# Patient Record
Sex: Male | Born: 1940
Health system: Southern US, Community
[De-identification: ages and names within clinical notes are randomized; demographics above are authoritative.]

## PROBLEM LIST (undated history)

## (undated) DIAGNOSIS — E785 Hyperlipidemia, unspecified: Secondary | ICD-10-CM

## (undated) DIAGNOSIS — I499 Cardiac arrhythmia, unspecified: Secondary | ICD-10-CM

## (undated) DIAGNOSIS — I10 Essential (primary) hypertension: Secondary | ICD-10-CM

## (undated) DIAGNOSIS — M4800 Spinal stenosis, site unspecified: Secondary | ICD-10-CM

## (undated) DIAGNOSIS — E7800 Pure hypercholesterolemia, unspecified: Secondary | ICD-10-CM

## (undated) DIAGNOSIS — A419 Sepsis, unspecified organism: Secondary | ICD-10-CM

## (undated) DIAGNOSIS — C801 Malignant (primary) neoplasm, unspecified: Secondary | ICD-10-CM

## (undated) DIAGNOSIS — I7090 Unspecified atherosclerosis: Secondary | ICD-10-CM

## (undated) DIAGNOSIS — I8390 Asymptomatic varicose veins of unspecified lower extremity: Secondary | ICD-10-CM

## (undated) DIAGNOSIS — I509 Heart failure, unspecified: Secondary | ICD-10-CM

## (undated) DIAGNOSIS — I2510 Atherosclerotic heart disease of native coronary artery without angina pectoris: Secondary | ICD-10-CM

## (undated) DIAGNOSIS — I872 Venous insufficiency (chronic) (peripheral): Secondary | ICD-10-CM

## (undated) DIAGNOSIS — D649 Anemia, unspecified: Secondary | ICD-10-CM

## (undated) HISTORY — DX: Heart failure, unspecified: I50.9

## (undated) HISTORY — DX: Cardiac arrhythmia, unspecified: I49.9

## (undated) HISTORY — DX: Atherosclerotic heart disease of native coronary artery without angina pectoris: I25.10

## (undated) HISTORY — PX: TONSILLECTOMY: SUR1361

## (undated) HISTORY — PX: BACK SURGERY: SHX140

## (undated) HISTORY — DX: Sepsis, unspecified organism: A41.9

---

## 2006-08-17 ENCOUNTER — Ambulatory Visit: Payer: Self-pay | Admitting: Gastroenterology

## 2007-09-26 ENCOUNTER — Ambulatory Visit: Payer: Self-pay | Admitting: Urology

## 2008-05-06 ENCOUNTER — Ambulatory Visit: Payer: Self-pay | Admitting: Family Medicine

## 2009-04-30 ENCOUNTER — Ambulatory Visit: Payer: Self-pay | Admitting: Gastroenterology

## 2009-10-01 ENCOUNTER — Ambulatory Visit: Payer: Self-pay | Admitting: Gastroenterology

## 2009-11-26 ENCOUNTER — Ambulatory Visit: Payer: Self-pay | Admitting: Gastroenterology

## 2013-03-22 ENCOUNTER — Ambulatory Visit: Payer: Self-pay | Admitting: Orthopedic Surgery

## 2014-05-28 ENCOUNTER — Ambulatory Visit: Payer: Self-pay

## 2014-05-31 ENCOUNTER — Emergency Department: Payer: Self-pay | Admitting: Emergency Medicine

## 2014-05-31 LAB — URINALYSIS, COMPLETE
BACTERIA: NONE SEEN
Specific Gravity: 1.02 (ref 1.003–1.030)
Squamous Epithelial: NONE SEEN
WBC UR: 446 /HPF (ref 0–5)

## 2014-06-04 ENCOUNTER — Ambulatory Visit: Payer: Self-pay | Admitting: Urology

## 2014-06-10 ENCOUNTER — Ambulatory Visit: Payer: Self-pay | Admitting: Urology

## 2014-11-15 NOTE — Op Note (Signed)
PATIENT NAME:  Duane Owens, Duane Owens MR#:  [id number] DATE OF BIRTH:  [birth year]  DATE OF PROCEDURE:  06/10/2014  PREOPERATIVE DIAGNOSES:  1. Urethral stricture disease.  2.  Prostate cancer.  3. Urinary retention.   POSTOPERATIVE DIAGNOSES:  1. Urethral stricture disease.  2.  Prostate cancer.  3. Urinary retention.   PROCEDURE:  1. Cystoscopy with visual internal urethrotomy using the holmium laser.  2. Foley catheter placement.   SURGEON: Maryan Puls, M.D.   ANESTHESIOLOGISTS:   Dennard Nip, MD.  ANESTHETIC METHOD: General per Dr. Ronelle Nigh and local per Dr. Yves Dill.   INDICATIONS: See the dictated history and physical. After informed consent, the patient requests the above procedure.   OPERATIVE SUMMARY: After adequate general anesthesia had been obtained, the patient was placed into dorsolithotomy position and the perineum was prepped and draped in the usual fashion. The 21 French cystoscope was coupled with the camera and then advanced into the urethra. A stricture was encountered at the bulbar urethra. A 0.035 guidewire was then passed through the stricture and curled into the bladder. The 550 micron holmium laser fiber was introduced through the scope and the stricture incised at the 12 o'clock position, eventually allowing entry into the bladder.   The bladder was thoroughly inspected. No bladder mucosal lesions were identified. Both ureteral orifices were identified and had clear efflux. At this point, cystoscope was removed taking care of the guidewire in position. Viscous Xylocaine 10 mL was instilled within the urethra. A 20 French [place] catheter was then advanced over the guidewire and positioned in the bladder. Guidewire was removed. B and O suppository was placed. The procedure was then terminated and the patient was transferred to the recovery room in stable condition.     ____________________________ Otelia Limes. Yves Dill, MD mrw:kl D: 06/10/2014 13:51:44  ET T: 06/10/2014 15:39:56 ET JOB#: [id number]  cc: Otelia Limes. Yves Dill, MD, <Dictator> Royston Cowper MD ELECTRONICALLY SIGNED 06/10/2014 17:16

## 2014-11-15 NOTE — H&P (Signed)
PATIENT NAME:  Duane Owens, Duane Owens MR#:  [id number] DATE OF BIRTH:  30-Jun-1941  DATE OF ADMISSION:  06/10/2014  CHIEF COMPLAINT: Difficulty voiding.   HISTORY OF PRESENT ILLNESS: Duane Owens is a 74 year old African American male with a 2-3 month history of decreased force of his stream and incomplete emptying. He actually developed urinary retention and went to the Emergency Room on 05/31/2014. Catheter placement at that time was unsuccessful. However, the patient was able to void successfully after the Emergency Room visit. He was evaluated with cystoscopy in the office on 06/02/2014, which indicated a tight stricture in the bulbar urethra and a false passage in the bulbar urethra from unsuccessful catheter placement in the ER. He comes in now for cystoscopy with internal urethrotomy using the holmium laser.   ALLERGIES: No drug allergies.   CURRENT MEDICATIONS INCLUDE: Allopurinol, gemfibrozil, hydrocodone, hydrochlorothiazide,  and amlodipine.   PAST SURGICAL HISTORY:  Lumbar laminectomy in 1998 and 2001.  I-125 brachytherapy in 2011.   PAST AND CURRENT MEDICAL CONDITIONS:  1.  Stage T1c, Gleason grade 4 + 4 adenocarcinoma of the prostate, status post I-125 brachytherapy in 2011.  2.  Gout.  3.  Degenerative spine disease.  4.  Hypertension.  5.  Hyperlipidemia.   REVIEW OF SYSTEMS: The patient denied chest pain, shortness of breath, diabetes, stroke, or hematuria.   PHYSICAL EXAMINATION:  GENERAL: Well-nourished African American male in no acute distress.  HEENT: Sclerae were clear. Pupils were equally round, reactive to light and accommodation. Extraocular movements were intact.  NECK: Supple. No palpable cervical adenopathy. No audible carotid bruits.  LUNGS: Clear to auscultation.  CARDIOVASCULAR: Regular rhythm and rate, without audible murmurs.  ABDOMEN: Soft, nontender abdomen.  GENITOURINARY: Uncircumcised. Testes smooth, nontender; 18 mL size each.  RECTAL: A 10 g, flat,  smooth, nontender prostate.  NEUROMUSCULAR: Alert and oriented x 3.   IMPRESSION: Urethral stricture disease.   PLAN: Cystoscopy with internal urethrotomy using holmium laser.    ____________________________ Otelia Limes. Yves Dill, MD mrw:MT D: 06/04/2014 10:03:00 ET T: 06/04/2014 10:14:27 ET JOB#: [id number]  cc: Otelia Limes. Yves Dill, MD, <Dictator> Royston Cowper MD ELECTRONICALLY SIGNED 06/04/2014 16:45

## 2015-07-15 ENCOUNTER — Other Ambulatory Visit: Payer: Self-pay | Admitting: Vascular Surgery

## 2015-08-04 ENCOUNTER — Ambulatory Visit
Admission: RE | Admit: 2015-08-04 | Discharge: 2015-08-04 | Disposition: A | Discharge status: Home / Self Care | Payer: Medicare Other | Source: Ambulatory Visit | Attending: Vascular Surgery | Admitting: Vascular Surgery

## 2015-08-04 ENCOUNTER — Encounter: Payer: Self-pay | Admitting: *Deleted

## 2015-08-04 ENCOUNTER — Encounter
Admission: RE | Disposition: A | Discharge status: Home / Self Care | Payer: Self-pay | Source: Ambulatory Visit | Attending: Vascular Surgery

## 2015-08-04 DIAGNOSIS — I70211 Atherosclerosis of native arteries of extremities with intermittent claudication, right leg: Secondary | ICD-10-CM | POA: Insufficient documentation

## 2015-08-04 HISTORY — DX: Unspecified atherosclerosis: I70.90

## 2015-08-04 HISTORY — DX: Malignant (primary) neoplasm, unspecified: C80.1

## 2015-08-04 HISTORY — DX: Hyperlipidemia, unspecified: E78.5

## 2015-08-04 HISTORY — DX: Pure hypercholesterolemia, unspecified: E78.00

## 2015-08-04 HISTORY — DX: Asymptomatic varicose veins of unspecified lower extremity: I83.90

## 2015-08-04 HISTORY — DX: Venous insufficiency (chronic) (peripheral): I87.2

## 2015-08-04 HISTORY — DX: Essential (primary) hypertension: I10

## 2015-08-04 HISTORY — DX: Spinal stenosis, site unspecified: M48.00

## 2015-08-04 SURGERY — LOWER EXTREMITY ANGIOGRAPHY
Anesthesia: Moderate Sedation | Laterality: Right

## 2015-08-04 MED ORDER — HYDROMORPHONE HCL 1 MG/ML IJ SOLN: 1.0000 mg | Freq: Once | INTRAMUSCULAR | Status: DC

## 2015-08-04 MED ORDER — CEFUROXIME SODIUM 1.5 G IJ SOLR: 1.5000 g | INTRAMUSCULAR | Status: DC

## 2015-08-04 MED ORDER — ONDANSETRON HCL 4 MG/2ML IJ SOLN: 4.0000 mg | Freq: Four times a day (QID) | INTRAMUSCULAR | Status: DC | PRN

## 2015-08-04 MED ORDER — SODIUM CHLORIDE 0.9 % IV SOLN: INTRAVENOUS | Status: DC

## 2015-08-11 ENCOUNTER — Ambulatory Visit
Admission: RE | Admit: 2015-08-11 | Discharge: 2015-08-11 | Disposition: A | Discharge status: Home / Self Care | Payer: Medicare Other | Source: Ambulatory Visit | Attending: Vascular Surgery | Admitting: Vascular Surgery

## 2015-08-11 ENCOUNTER — Encounter: Payer: Self-pay | Admitting: Anesthesiology

## 2015-08-11 ENCOUNTER — Encounter
Admission: RE | Disposition: A | Discharge status: Home / Self Care | Payer: Self-pay | Source: Ambulatory Visit | Attending: Vascular Surgery

## 2015-08-11 DIAGNOSIS — Z8546 Personal history of malignant neoplasm of prostate: Secondary | ICD-10-CM | POA: Diagnosis not present

## 2015-08-11 DIAGNOSIS — E785 Hyperlipidemia, unspecified: Secondary | ICD-10-CM | POA: Diagnosis not present

## 2015-08-11 DIAGNOSIS — I872 Venous insufficiency (chronic) (peripheral): Secondary | ICD-10-CM | POA: Insufficient documentation

## 2015-08-11 DIAGNOSIS — F17200 Nicotine dependence, unspecified, uncomplicated: Secondary | ICD-10-CM | POA: Diagnosis not present

## 2015-08-11 DIAGNOSIS — E669 Obesity, unspecified: Secondary | ICD-10-CM | POA: Diagnosis not present

## 2015-08-11 DIAGNOSIS — Z79899 Other long term (current) drug therapy: Secondary | ICD-10-CM | POA: Diagnosis not present

## 2015-08-11 DIAGNOSIS — I70211 Atherosclerosis of native arteries of extremities with intermittent claudication, right leg: Secondary | ICD-10-CM | POA: Diagnosis present

## 2015-08-11 DIAGNOSIS — I8310 Varicose veins of unspecified lower extremity with inflammation: Secondary | ICD-10-CM | POA: Diagnosis not present

## 2015-08-11 DIAGNOSIS — M4806 Spinal stenosis, lumbar region: Secondary | ICD-10-CM | POA: Diagnosis not present

## 2015-08-11 DIAGNOSIS — I10 Essential (primary) hypertension: Secondary | ICD-10-CM | POA: Insufficient documentation

## 2015-08-11 HISTORY — PX: PERIPHERAL VASCULAR CATHETERIZATION: SHX172C

## 2015-08-11 LAB — CREATININE, SERUM: CREATININE: 1.1 mg/dL (ref 0.61–1.24)

## 2015-08-11 LAB — POTASSIUM: Potassium: 4.1 mmol/L (ref 3.5–5.1)

## 2015-08-11 LAB — BUN: BUN: 29 mg/dL — ABNORMAL HIGH (ref 6–20)

## 2015-08-11 SURGERY — ABDOMINAL AORTOGRAM W/LOWER EXTREMITY
Laterality: Right | Wound class: Clean

## 2015-08-11 MED ORDER — DEXTROSE 5 % IV SOLN
INTRAVENOUS | Status: AC
  Filled 2015-08-11 (×10): qty 1.5

## 2015-08-11 MED ORDER — CLOPIDOGREL BISULFATE 75 MG PO TABS
ORAL_TABLET | ORAL | Status: AC
  Filled 2015-08-11: qty 4

## 2015-08-11 MED ORDER — LIDOCAINE HCL (PF) 1 % IJ SOLN
INTRAMUSCULAR | Status: AC
  Filled 2015-08-11: qty 30

## 2015-08-11 MED ORDER — MIDAZOLAM HCL 5 MG/5ML IJ SOLN
INTRAMUSCULAR | Status: AC
  Filled 2015-08-11: qty 5

## 2015-08-11 MED ORDER — OXYCODONE HCL 5 MG PO TABS: 5.0000 mg | ORAL_TABLET | ORAL | Status: DC | PRN

## 2015-08-11 MED ORDER — HEPARIN SODIUM (PORCINE) 1000 UNIT/ML IJ SOLN
INTRAMUSCULAR | Status: DC | PRN
  Administered 2015-08-11: 5000 [IU] via INTRAVENOUS

## 2015-08-11 MED ORDER — FAMOTIDINE 20 MG PO TABS: 40.0000 mg | ORAL_TABLET | ORAL | Status: DC | PRN

## 2015-08-11 MED ORDER — IOHEXOL 300 MG/ML  SOLN
INTRAMUSCULAR | Status: DC | PRN
Start: 2015-08-11 — End: 2015-08-11
  Administered 2015-08-11: 85 mL via INTRA_ARTERIAL

## 2015-08-11 MED ORDER — ACETAMINOPHEN 325 MG RE SUPP
325.0000 mg | RECTAL | Status: DC | PRN
  Filled 2015-08-11: qty 2

## 2015-08-11 MED ORDER — ACETAMINOPHEN 325 MG PO TABS: 325.0000 mg | ORAL_TABLET | ORAL | Status: DC | PRN

## 2015-08-11 MED ORDER — CLOPIDOGREL BISULFATE 75 MG PO TABS: 75.0000 mg | ORAL_TABLET | Freq: Every day | ORAL | Status: DC

## 2015-08-11 MED ORDER — METHYLPREDNISOLONE SODIUM SUCC 125 MG IJ SOLR: 125.0000 mg | INTRAMUSCULAR | Status: DC | PRN

## 2015-08-11 MED ORDER — LIDOCAINE-EPINEPHRINE 1 %-1:100000 IJ SOLN
INTRAMUSCULAR | Status: DC | PRN
  Administered 2015-08-11: 10 mL

## 2015-08-11 MED ORDER — HEPARIN SODIUM (PORCINE) 1000 UNIT/ML IJ SOLN
INTRAMUSCULAR | Status: AC
  Filled 2015-08-11: qty 1

## 2015-08-11 MED ORDER — MIDAZOLAM HCL 2 MG/2ML IJ SOLN
INTRAMUSCULAR | Status: DC | PRN
  Administered 2015-08-11: 1 mg via INTRAVENOUS
  Administered 2015-08-11: 2 mg via INTRAVENOUS

## 2015-08-11 MED ORDER — FENTANYL CITRATE (PF) 100 MCG/2ML IJ SOLN
INTRAMUSCULAR | Status: DC | PRN
  Administered 2015-08-11 (×2): 50 ug via INTRAVENOUS

## 2015-08-11 MED ORDER — MORPHINE SULFATE (PF) 4 MG/ML IV SOLN: 2.0000 mg | INTRAVENOUS | Status: DC | PRN

## 2015-08-11 MED ORDER — FENTANYL CITRATE (PF) 100 MCG/2ML IJ SOLN
INTRAMUSCULAR | Status: AC
  Filled 2015-08-11: qty 2

## 2015-08-11 MED ORDER — SODIUM CHLORIDE 0.9 % IV SOLN
INTRAVENOUS | Status: DC
  Administered 2015-08-11 (×2): via INTRAVENOUS

## 2015-08-11 MED ORDER — CLOPIDOGREL BISULFATE 75 MG PO TABS
300.0000 mg | ORAL_TABLET | Freq: Once | ORAL | Status: AC
  Administered 2015-08-11: 300 mg via ORAL

## 2015-08-11 MED ORDER — HEPARIN (PORCINE) IN NACL 2-0.9 UNIT/ML-% IJ SOLN
INTRAMUSCULAR | Status: AC
  Filled 2015-08-11: qty 1000

## 2015-08-11 SURGICAL SUPPLY — 25 items
BALLN LUTONIX 6X120X130 (BALLOONS) ×5
BALLN ULTRVRSE 4X120X150 (BALLOONS) ×5
BALLN ULTRVRSE 6X100X130C (BALLOONS) ×5
BALLOON LUTONIX 6X120X130 (BALLOONS) ×3 IMPLANT
BALLOON ULTRVRSE 4X120X150 (BALLOONS) ×3 IMPLANT
BALLOON ULTRVRSE 6X100X130C (BALLOONS) ×3 IMPLANT
CATH ANGIO STR 110CM 5SH (CATHETERS) ×5 IMPLANT
CATH CROSSER 14S OTW 106CM (CATHETERS) ×5 IMPLANT
CATH PIG 70CM (CATHETERS) ×5 IMPLANT
CATH RIM 65CM (CATHETERS) ×5 IMPLANT
CATH SKICK ANG 70CM (CATHETERS) ×5 IMPLANT
DEVICE PRESTO INFLATION (MISCELLANEOUS) ×5 IMPLANT
DEVICE STARCLOSE SE CLOSURE (Vascular Products) ×5 IMPLANT
GLIDEWIRE ANGLED SS 035X260CM (WIRE) ×5 IMPLANT
KIT FLOWMATE PROCEDURAL (MISCELLANEOUS) ×5 IMPLANT
LIFESTENT 7X100X130 (Permanent Stent) ×5 IMPLANT
PACK ANGIOGRAPHY (CUSTOM PROCEDURE TRAY) ×5 IMPLANT
SET INTRO CAPELLA COAXIAL (SET/KITS/TRAYS/PACK) ×5 IMPLANT
SHEATH BRITE TIP 5FRX11 (SHEATH) ×5 IMPLANT
SHEATH RAABE 6FR (SHEATH) ×5 IMPLANT
SYR MEDRAD MARK V 150ML (SYRINGE) ×5 IMPLANT
TUBING CONTRAST HIGH PRESS 72 (TUBING) ×5 IMPLANT
WIRE J 3MM .035X145CM (WIRE) ×5 IMPLANT
WIRE MAGIC TORQUE 260C (WIRE) ×5 IMPLANT
WIRE SPARTACORE .014X300CM (WIRE) ×5 IMPLANT

## 2015-08-11 NOTE — Progress Notes (Signed)
Pt clinically stable post angiogram with stenting, daughter present, Dr Delana Meyer in to see pt with questions answered, discharge teaching done with questions answered with return appt, made, no bleeding nor hematoma at right groin site,

## 2015-08-11 NOTE — Progress Notes (Signed)
No changes, no bleeding nor hematoma at groin site, discharge instruction s given, no changes,

## 2015-08-11 NOTE — H&P (Signed)
[facility] History & Physical Update  The patient was interviewed and re-examined.  The patient's previous History and Physical has been reviewed and is unchanged.  There is no change in the plan of care. We plan to proceed with the scheduled procedure.  Myiah Petkus, Dolores Lory, MD  08/11/2015, 9:28 AM

## 2015-08-11 NOTE — Op Note (Signed)
[facility] Percutaneous Study/Intervention Procedural Note   Date of Surgery: 08/11/2015  Surgeon:  Katha Cabal, MD.  Pre-operative Diagnosis: Atherosclerotic occlusive disease bilateral lower extremities with lifestyle limiting claudication right leg  Post-operative diagnosis: Same  Procedure(s) Performed: 1. Introduction catheter into right lower extremity 3rd order catheter placement  2. Contrast injection right lower extremity for distal runoff   3. Crosser atherectomy of the right SFA and popliteal arteries 4.  Percutaneous transluminal angioplasty and stent placement right superficial femoral artery and popliteal             5.   Star close closure left common femoral arteriotomy  Anesthesia: Conscious sedation was administered under my direct supervision. IV Versed plus fentanyl were utilized. Continuous ECG, pulse oximetry and blood pressure was monitored throughout the entire procedure. A total of 3 milligrams of Versed and 100 micrograms of fentanyl were utilized.  Conscious sedation was begun at  9:32 AM and concluded at 10:29 AM for a total of 57 minutes.  Sheath: 6 [place]  Contrast: 85 cc  Fluoroscopy Time: 7.3 minutes  Indications: Duane Owens presents with lifestyle limiting pain in the right lower extremity with known atherosclerotic occlusion of the right SFA and popliteal. The risks and benefits are reviewed all questions answered patient agrees to proceed.  Procedure: Duane Owens is a 75 y.o. y.o. male who was identified and appropriate procedural time out was performed. The patient was then placed supine on the table and prepped and draped in the usual sterile fashion.   Ultrasound was placed in the sterile sleeve and the left groin was evaluated the left common femoral artery was echolucent and pulsatile indicating patency.  Image was recorded for the permanent  record and under real-time visualization a microneedle was inserted into the common femoral artery microwire followed by a micro-sheath.  A J-wire was then advanced through the micro-sheath and a  5 [place] sheath was then inserted over a J-wire. J-wire was then advanced and a 5 French pigtail catheter was positioned at the level of T12. AP projection of the aorta was then obtained. Pigtail catheter was repositioned to above the bifurcation and a LAO view of the pelvis was obtained.  Subsequently a rim catheter with the stiff angle Glidewire was used to cross the aortic bifurcation the catheter wire were advanced down into the right distal external iliac artery. Oblique view of the femoral bifurcation was then obtained and subsequently the wire was reintroduced and the pigtail catheter negotiated into the SFA representing third order catheter placement. Distal runoff was then performed.  5000 units of heparin was then given and allowed to circulate and a 6 Fr. Rabi sheath was advanced up and over the bifurcation and positioned in the femoral artery  The 14 asked Crosser catheter was then prepped on the field and a angled sidekick catheter was advanced into the cul-de-sac of the SFA under magnified imaging in the LAO projection. Using the Crosser catheter the occlusion was negotiated.  Hand injection of contrast through the side port of the 14 asked catheter was performed demonstrating intraluminal positioning within the mid popliteal. 014 [place] core wire was then advanced through the Crosser and the Crosser catheter and sidekick were removed.    A 4 x 12 balloon was used to angioplasty the superficial femoral and popliteal arteries. Inflations were to 10 atmospheres for 2 minutes. Follow-up imaging demonstrated patency with adequate preparation of the vessel for a drug-coated balloon.  Subsequently, a  6 x 12 Lutonix balloon was utilized inflating to 12 atm for 2 full minutes. Follow-up imaging demonstrated  greater than 50% residual stenosis and therefore a 7 x 100 life stent was deployed and subsequently postdilated with a 6 x 100 all traverse. Distal runoff was then reassessed.  After review of these images the sheath is pulled into the left external iliac oblique of the common femoral is obtained and a Star close device deployed. There no immediate Complications.  Findings: The abdominal aorta is opacified with a bolus injection contrast. No hemodynamically significant lesions are noted although diffuse disease is present. Bilateral nephrograms are identified normal in size single renal arteries noted no evidence of renal artery stenosis. The common external and internal iliac arteries are widely patent bilaterally.  The right common femoral and profunda femoris demonstrate calcific disease but are widely patent no evidence of a hemodynamically significant lesion. Proximal one third of the right SFA is patent without hemodynamic significant stenosis. The mid through distal SFA and proximal popliteal artery occluded. There is reconstitution of the popliteal artery. There is three-vessel runoff at the trifurcation however the anterior tibial occludes in its proximal one third. Posterior tibial is the dominant vessel to the foot. Of note patient has an anatomic aberration with the posterior tibial artery originating from the mid popliteal several centimeters above the anterior tibial arteries origin. Plantar arch is intact.  Following Crosser atherectomy there is successful positioning of the wire and catheter in the true lumen. Following angioplasty there is greater than 50% residual stenosis and therefore a life stent was deployed with excellent result  Summary: Successful recanalization of the right SFA and proximal popliteal with angioplasty and stent placement.   Disposition: Patient was taken to the recovery room in stable condition having tolerated the procedure  well.  Duane Owens, Duane Owens 04/28/2015,3:14 PM

## 2015-08-11 NOTE — Discharge Instructions (Signed)
Angiogram, Care After °Refer to this sheet in the next few weeks. These instructions provide you with information about caring for yourself after your procedure. Your health care provider may also give you more specific instructions. Your treatment has been planned according to current medical practices, but problems sometimes occur. Call your health care provider if you have any problems or questions after your procedure. °WHAT TO EXPECT AFTER THE PROCEDURE °After your procedure, it is typical to have the following: °· Bruising at the catheter insertion site that usually fades within 1-2 weeks. °· Blood collecting in the tissue (hematoma) that may be painful to the touch. It should usually decrease in size and tenderness within 1-2 weeks. °HOME CARE INSTRUCTIONS °· Take medicines only as directed by your health care provider. °· You may shower 24-48 hours after the procedure or as directed by your health care provider. Remove the bandage (dressing) and gently wash the site with plain soap and water. Pat the area dry with a clean towel. Do not rub the site, because this may cause bleeding. °· Do not take baths, swim, or use a hot tub until your health care provider approves. °· Check your insertion site every day for redness, swelling, or drainage. °· Do not apply powder or lotion to the site. °· Do not lift over 10 lb (4.5 kg) for 5 days after your procedure or as directed by your health care provider. °· Ask your health care provider when it is okay to: °¨ Return to work or school. °¨ Resume usual physical activities or sports. °¨ Resume sexual activity. °· Do not drive home if you are discharged the same day as the procedure. Have someone else drive you. °· You may drive 24 hours after the procedure unless otherwise instructed by your health care provider. °· Do not operate machinery or power tools for 24 hours after the procedure or as directed by your health care provider. °· If your procedure was done as an  outpatient procedure, which means that you went home the same day as your procedure, a responsible adult should be with you for the first 24 hours after you arrive home. °· Keep all follow-up visits as directed by your health care provider. This is important. °SEEK MEDICAL CARE IF: °· You have a fever. °· You have chills. °· You have increased bleeding from the catheter insertion site. Hold pressure on the site. °SEEK IMMEDIATE MEDICAL CARE IF: °· You have unusual pain at the catheter insertion site. °· You have redness, warmth, or swelling at the catheter insertion site. °· You have drainage (other than a small amount of blood on the dressing) from the catheter insertion site. °· The catheter insertion site is bleeding, and the bleeding does not stop after 30 minutes of holding steady pressure on the site. °· The area near or just beyond the catheter insertion site becomes pale, cool, tingly, or numb. °  °This information is not intended to replace advice given to you by your health care provider. Make sure you discuss any questions you have with your health care provider. °  °Document Released: 01/27/2005 Document Revised: 08/01/2014 Document Reviewed: 12/12/2012 °Elsevier Interactive Patient Education ©2016 Elsevier Inc. ° °

## 2015-08-12 ENCOUNTER — Encounter: Payer: Self-pay | Admitting: Vascular Surgery

## 2015-08-13 ENCOUNTER — Encounter: Payer: Self-pay | Admitting: Vascular Surgery

## 2016-01-22 NOTE — H&P (Signed)
NAMESHAMEL, ABIDI NO.:  [id number]  MEDICAL RECORD NO.:  [id number]  LOCATION:  PERIO                        FACILITY:  [facility]  PHYSICIAN:  Maryan Puls          DATE OF BIRTH:  Nov 28, 1940  DATE OF ADMISSION:  02/02/2016 DATE OF DISCHARGE:                            HISTORY AND PHYSICAL   DATE OF SURGERY:  February 02, 2016.  CHIEF COMPLAINT:  Difficulty voiding.  HISTORY OF PRESENT ILLNESS:  Mr. Duane Owens is a 75 year old African American male with one-month history of difficulty voiding.  Cystoscopy in the office on June 27 indicated a stricture near the apex of the urethra and prostate.  I was unable to pass the scope beyond the stricture.  The aperture appeared to be approximately 1 mm in size.  The patient does have a past history of stricture disease and underwent incision with the holmium laser in November 2015.  The patient has a history of prostate cancer and is status post I-125 brachytherapy in 2011.  The prostate cancer was stage T1c with Gleason grade 4+4.  Most recent PSA was less than 0.1 ng/mL on January 14, 2016.  The patient comes in now for cystoscopy with incision of stricture with holmium laser.  PAST MEDICAL HISTORY:  No drug allergies.  CURRENT MEDICATIONS:  Allopurinol, gemfibrozil, hydrocodone, HCTZ, and amlodipine.  PAST SURGICAL HISTORY: 1. Lumbar laminectomy in 1998 and 2001. 2. I-125 brachytherapy in 2011. 3. Internal urethrotomy with holmium laser in 2015.  SOCIAL HISTORY:  The patient smokes a pack a day and has a greater than 30-pack-year history.  He denied alcohol use.  FAMILY HISTORY:  Negative for urological disease.  PAST AND CURRENT MEDICAL CONDITIONS: 1. Hypertension. 2. Hyperlipidemia. 3. Gout. 4. Degenerative spine disease. 5. Stage T1c Gleason grade 4+4 adenocarcinoma positive, status post I-     125 brachytherapy in 2011.  REVIEW OF SYSTEMS:  The patient denied chest pain, shortness of breath, diabetes,  stroke, or hematuria.  PHYSICAL EXAMINATION:  GENERAL:  Well-nourished, African American male, in no acute distress. HEENT:  Sclerae were clear.  Pupils were equally round, reactive to light and accommodation.  Extraocular movements were intact. NECK:  Supple.  No palpable cervical adenopathy.  No audible carotid bruits.  LUNGS:  Clear to auscultation. CARDIOVASCULAR:  Regular rhythm and rate without audible murmurs. ABDOMEN:  Soft, nontender. GENITOURINARY:  Uncircumcised.  Testes smooth and nontender. RECTAL:  10 g flap, smooth, nontender prostate. NEUROMUSCULAR:  Alert and oriented x3.  IMPRESSION:  Urethral stricture disease.  PLAN:  Cystoscopy with internal urethrotomy using holmium laser.          ______________________________ Maryan Puls     MW/MEDQ  D:  01/22/2016  T:  01/22/2016  Job:  [id number]

## 2016-01-25 ENCOUNTER — Inpatient Hospital Stay: Admission: RE | Admit: 2016-01-25 | Payer: Medicare Other | Source: Ambulatory Visit

## 2016-02-01 ENCOUNTER — Encounter: Payer: Self-pay | Admitting: *Deleted

## 2016-02-01 NOTE — Patient Instructions (Signed)
  Your procedure is scheduled on:02/02/16 Report to Day Surgery. Arrival time 1:45  Remember: Instructions that are not followed completely may result in serious medical risk, up to and including death, or upon the discretion of your surgeon and anesthesiologist your surgery may need to be rescheduled.    ___x 1. Do not eat food or drink liquids after midnight. No gum chewing or hard candies.     __x__ 2. No Alcohol for 24 hours before or after surgery.   __x__ 3. Do Not Smoke For 24 Hours Prior to Your Surgery.   ____ 4. Bring all medications with you on the day of surgery if instructed.    __x__ 5. Notify your doctor if there is any change in your medical condition     (cold, fever, infections).       Do not wear jewelry, make-up, hairpins, clips or nail polish.  Do not wear lotions, powders, or perfumes. You may wear deodorant.  Do not shave 48 hours prior to surgery. Men may shave face and neck.  Do not bring valuables to the hospital.    [facility] is not responsible for any belongings or valuables.               Contacts, dentures or bridgework may not be worn into surgery.  Leave your suitcase in the car. After surgery it may be brought to your room.  For patients admitted to the hospital, discharge time is determined by your                treatment team.   Patients discharged the day of surgery will not be allowed to drive home.   Please read over the following fact sheets that you were given:      ____ Take these medicines the morning of surgery with A SIP OF WATER:    1.   2.   3.   4.  5.  6.  ____ Fleet Enema (as directed)   ____ Use CHG Soap as directed  ____ Use inhalers on the day of surgery  ____ Stop metformin 2 days prior to surgery    ____ Take 1/2 of usual insulin dose the night before surgery and none on the morning of surgery.   __x__ Stop aspirin today  __x__ Stop Anti-inflammatories on today   ____ Stop supplements until after surgery.     ____ Bring C-Pap to the hospital.

## 2016-02-02 ENCOUNTER — Ambulatory Visit: Payer: Medicare Other | Admitting: Anesthesiology

## 2016-02-02 ENCOUNTER — Encounter: Payer: Self-pay | Admitting: *Deleted

## 2016-02-02 ENCOUNTER — Encounter
Admission: RE | Disposition: A | Discharge status: Home / Self Care | Payer: Self-pay | Source: Ambulatory Visit | Attending: Urology

## 2016-02-02 ENCOUNTER — Ambulatory Visit
Admission: RE | Admit: 2016-02-02 | Discharge: 2016-02-02 | Disposition: A | Discharge status: Home / Self Care | Payer: Medicare Other | Source: Ambulatory Visit | Attending: Urology | Admitting: Urology

## 2016-02-02 DIAGNOSIS — I10 Essential (primary) hypertension: Secondary | ICD-10-CM | POA: Diagnosis not present

## 2016-02-02 DIAGNOSIS — N359 Urethral stricture, unspecified: Secondary | ICD-10-CM | POA: Diagnosis not present

## 2016-02-02 DIAGNOSIS — Z8546 Personal history of malignant neoplasm of prostate: Secondary | ICD-10-CM | POA: Insufficient documentation

## 2016-02-02 DIAGNOSIS — IMO0002 Reserved for concepts with insufficient information to code with codable children: Secondary | ICD-10-CM

## 2016-02-02 DIAGNOSIS — M109 Gout, unspecified: Secondary | ICD-10-CM | POA: Diagnosis not present

## 2016-02-02 DIAGNOSIS — F17200 Nicotine dependence, unspecified, uncomplicated: Secondary | ICD-10-CM | POA: Insufficient documentation

## 2016-02-02 DIAGNOSIS — E785 Hyperlipidemia, unspecified: Secondary | ICD-10-CM | POA: Insufficient documentation

## 2016-02-02 HISTORY — PX: HOLMIUM LASER APPLICATION: SHX5852

## 2016-02-02 HISTORY — DX: Anemia, unspecified: D64.9

## 2016-02-02 HISTORY — PX: CYSTOSCOPY WITH DIRECT VISION INTERNAL URETHROTOMY: SHX6637

## 2016-02-02 SURGERY — CYSTOSCOPY, WITH DIRECT VISION INTERNAL URETHROTOMY
Anesthesia: General | Wound class: Clean Contaminated

## 2016-02-02 MED ORDER — LIDOCAINE HCL 2 % EX GEL
CUTANEOUS | Status: DC | PRN
  Administered 2016-02-02: 1

## 2016-02-02 MED ORDER — LEVOFLOXACIN IN D5W 500 MG/100ML IV SOLN
INTRAVENOUS | Status: AC
  Filled 2016-02-02: qty 100

## 2016-02-02 MED ORDER — NUCYNTA 50 MG PO TABS
50.0000 mg | ORAL_TABLET | Freq: Four times a day (QID) | ORAL | Status: DC | PRN
Start: 2016-02-02 — End: 2016-11-01

## 2016-02-02 MED ORDER — BELLADONNA ALKALOIDS-OPIUM 16.2-60 MG RE SUPP
RECTAL | Status: AC
  Filled 2016-02-02: qty 1

## 2016-02-02 MED ORDER — ONDANSETRON HCL 4 MG/2ML IJ SOLN: 4.0000 mg | Freq: Once | INTRAMUSCULAR | Status: DC | PRN

## 2016-02-02 MED ORDER — FAMOTIDINE 20 MG PO TABS
20.0000 mg | ORAL_TABLET | Freq: Once | ORAL | Status: AC
  Administered 2016-02-02: 20 mg via ORAL

## 2016-02-02 MED ORDER — LIDOCAINE HCL 2 % EX GEL
CUTANEOUS | Status: AC
  Filled 2016-02-02: qty 10

## 2016-02-02 MED ORDER — FENTANYL CITRATE (PF) 100 MCG/2ML IJ SOLN
INTRAMUSCULAR | Status: DC | PRN
  Administered 2016-02-02: 100 ug via INTRAVENOUS
  Administered 2016-02-02 (×3): 25 ug via INTRAVENOUS

## 2016-02-02 MED ORDER — FENTANYL CITRATE (PF) 100 MCG/2ML IJ SOLN: 25.0000 ug | INTRAMUSCULAR | Status: DC | PRN

## 2016-02-02 MED ORDER — LEVOFLOXACIN IN D5W 500 MG/100ML IV SOLN
500.0000 mg | INTRAVENOUS | Status: DC
  Administered 2016-02-02: 500 mg via INTRAVENOUS

## 2016-02-02 MED ORDER — FAMOTIDINE 20 MG PO TABS
ORAL_TABLET | ORAL | Status: AC
  Filled 2016-02-02: qty 1

## 2016-02-02 MED ORDER — LEVOFLOXACIN 500 MG PO TABS: 500.0000 mg | ORAL_TABLET | Freq: Every day | ORAL | Status: DC

## 2016-02-02 MED ORDER — PROPOFOL 10 MG/ML IV BOLUS
INTRAVENOUS | Status: DC | PRN
Start: 2016-02-02 — End: 2016-02-02
  Administered 2016-02-02: 10 mg via INTRAVENOUS
  Administered 2016-02-02: 150 mg via INTRAVENOUS
  Administered 2016-02-02: 10 mg via INTRAVENOUS
  Administered 2016-02-02: 20 mg via INTRAVENOUS

## 2016-02-02 MED ORDER — BELLADONNA ALKALOIDS-OPIUM 16.2-60 MG RE SUPP
RECTAL | Status: DC | PRN
  Administered 2016-02-02: 1 via RECTAL

## 2016-02-02 MED ORDER — EPHEDRINE SULFATE 50 MG/ML IJ SOLN
INTRAMUSCULAR | Status: DC | PRN
  Administered 2016-02-02 (×2): 5 mg via INTRAVENOUS

## 2016-02-02 MED ORDER — LACTATED RINGERS IV SOLN
INTRAVENOUS | Status: DC
  Administered 2016-02-02 (×2): via INTRAVENOUS

## 2016-02-02 MED ORDER — HYOSCYAMINE SULFATE 0.125 MG SL SUBL: 0.1250 mg | SUBLINGUAL_TABLET | SUBLINGUAL | Status: DC | PRN

## 2016-02-02 MED ORDER — PHENYLEPHRINE HCL 10 MG/ML IJ SOLN: INTRAMUSCULAR | Status: DC | PRN

## 2016-02-02 MED ORDER — LIDOCAINE HCL (CARDIAC) 20 MG/ML IV SOLN
INTRAVENOUS | Status: DC | PRN
  Administered 2016-02-02: 100 mg via INTRAVENOUS

## 2016-02-02 MED ORDER — PHENYLEPHRINE HCL 10 MG/ML IJ SOLN
INTRAMUSCULAR | Status: DC | PRN
  Administered 2016-02-02: 50 ug via INTRAVENOUS

## 2016-02-02 MED ORDER — URIBEL 118 MG PO CAPS: 1.0000 | ORAL_CAPSULE | Freq: Four times a day (QID) | ORAL | Status: DC | PRN

## 2016-02-02 SURGICAL SUPPLY — 22 items
BAG DRAIN CYSTO-URO LG1000N (MISCELLANEOUS) ×3 IMPLANT
BAG URO DRAIN 2000ML W/SPOUT (MISCELLANEOUS) ×3 IMPLANT
CATH FOLEY 2W COUNCIL 20FR 5CC (CATHETERS) ×3 IMPLANT
CATH FOLEY 2WAY  5CC 20FR SIL (CATHETERS)
CATH FOLEY 2WAY 5CC 20FR SIL (CATHETERS) IMPLANT
FEE TECHNICIAN ONLY PER HOUR (MISCELLANEOUS) IMPLANT
GLOVE BIO SURGEON STRL SZ7 (GLOVE) ×6 IMPLANT
GLOVE BIO SURGEON STRL SZ7.5 (GLOVE) ×3 IMPLANT
GOWN STRL REUS W/ TWL LRG LVL3 (GOWN DISPOSABLE) ×1 IMPLANT
GOWN STRL REUS W/ TWL XL LVL3 (GOWN DISPOSABLE) ×1 IMPLANT
GOWN STRL REUS W/TWL LRG LVL3 (GOWN DISPOSABLE) ×2
GOWN STRL REUS W/TWL XL LVL3 (GOWN DISPOSABLE) ×2
KIT RM TURNOVER CYSTO AR (KITS) ×3 IMPLANT
LASER FIBER 550M SMARTSCOPE (Laser) ×3 IMPLANT
PACK CYSTO AR (MISCELLANEOUS) ×3 IMPLANT
PREP PVP WINGED SPONGE (MISCELLANEOUS) IMPLANT
SET CYSTO W/LG BORE CLAMP LF (SET/KITS/TRAYS/PACK) ×3 IMPLANT
SOL PREP PVP 2OZ (MISCELLANEOUS) ×3
SOLUTION PREP PVP 2OZ (MISCELLANEOUS) ×1 IMPLANT
WATER STERILE IRR 1000ML POUR (IV SOLUTION) ×3 IMPLANT
WATER STERILE IRR 3000ML UROMA (IV SOLUTION) ×3 IMPLANT
WIRE G 035X145 STIFF STR (WIRE) ×3 IMPLANT

## 2016-02-02 NOTE — H&P (Signed)
Date of Initial H&P: 01/22/16  History reviewed, patient examined, no change in status, stable for surgery.

## 2016-02-02 NOTE — Op Note (Signed)
Preoperative diagnosis: Urethral stricture disease  Postoperative diagnosis: Same   Procedure: 1. Cystoscopy with internal urethrotomy using the holmium laser                      2. Foley catheter placement    Surgeon: Otelia Limes. Yves Dill MD Anesthesia: General + local Indications:See the history and physical. After informed consent the above procedure(s) were requested     Technique and findings: After adequate general anesthesia been obtained the patient was placed into dorsal lithotomy position and the perineum was prepped and draped in usual fashion. The 4 French the scope was coupled the camera and visually advanced into the mid urethra. Stricture was encountered at the bulbar urethra with aperture of approximately 1-2 mm. A 0.035 wire was advanced through the stricture and curled into the bladder. The cystoscope was removed taking care leave the guidewire in position. Scope was then reintroduced and visually advanced to the stricture. The 150  holmium laser fiber was passed through the scope and stricture incised at the 12:00 position. The stricture was short and easily incised. The bladder was then entered and thoroughly inspected. With ureteral orifices were identified and had clear reflux. No bladder tumors were identified. The bladder was moderately trabeculated. The prostate was atrophic consistent with prior radiation therapy. At this point the cystoscope was removed and 10 cc of viscous Xylocaine instilled within the urethra and the bladder. A 20 [place] catheter was advanced over the guidewire and passed into the bladder. The catheter had clear drainage. A B&O suppository was placed. Procedure was terminated and patient transferred to the recovery room stable condition.

## 2016-02-02 NOTE — Transfer of Care (Signed)
Immediate Anesthesia Transfer of Care Note  Patient: Duane Owens  Procedure(s) Performed: Procedure(s): CYSTOSCOPY WITH DIRECT VISION INTERNAL URETHROTOMY (N/A) HOLMIUM LASER APPLICATION (N/A)  Patient Location: PACU  Anesthesia Type:General  Level of Consciousness: awake  Airway & Oxygen Therapy: Patient Spontanous Breathing and Patient connected to face mask oxygen  Post-op Assessment: Report given to RN and Post -op Vital signs reviewed and stable  Post vital signs: Reviewed and stable  Last Vitals:  Filed Vitals:   02/02/16 1358  BP: 135/66  Pulse: 76  Temp: 36.4 C  Resp: 18    Last Pain: There were no vitals filed for this visit.       Complications: No apparent anesthesia complications

## 2016-02-02 NOTE — Anesthesia Procedure Notes (Signed)
Procedure Name: LMA Insertion Date/Time: 02/02/2016 2:51 PM Performed by: Rosaria Ferries, Eluzer Howdeshell Pre-anesthesia Checklist: Patient identified, Emergency Drugs available, Suction available and Patient being monitored Patient Re-evaluated:Patient Re-evaluated prior to inductionOxygen Delivery Method: Circle system utilized Preoxygenation: Pre-oxygenation with 100% oxygen Intubation Type: IV induction LMA Size: 4.0 Number of attempts: 1 Placement Confirmation: breath sounds checked- equal and bilateral Tube secured with: Tape Dental Injury: Teeth and Oropharynx as per pre-operative assessment

## 2016-02-02 NOTE — Anesthesia Postprocedure Evaluation (Signed)
Anesthesia Post Note  Patient: Duane Owens  Procedure(s) Performed: Procedure(s) (LRB): CYSTOSCOPY WITH DIRECT VISION INTERNAL URETHROTOMY (N/A) HOLMIUM LASER APPLICATION (N/A)  Patient location during evaluation: PACU Anesthesia Type: General Level of consciousness: awake and alert Pain management: pain level controlled Vital Signs Assessment: post-procedure vital signs reviewed and stable Respiratory status: spontaneous breathing, nonlabored ventilation, respiratory function stable and patient connected to nasal cannula oxygen Cardiovascular status: blood pressure returned to baseline and stable Postop Assessment: no signs of nausea or vomiting Anesthetic complications: no    Last Vitals:  Filed Vitals:   02/02/16 1622 02/02/16 1641  BP: 155/65 147/60  Pulse: 65 62  Temp: 35.7 C   Resp: 18 18    Last Pain:  Filed Vitals:   02/02/16 1641  PainSc: 0-No pain                 Quintus Premo S

## 2016-02-02 NOTE — Anesthesia Preprocedure Evaluation (Signed)
Anesthesia Evaluation  Patient identified by MRN, date of birth, ID band Patient awake    Reviewed: Allergy & Precautions, H&P , NPO status , Patient's Chart, lab work & pertinent test results, reviewed documented beta blocker date and time   Airway Mallampati: II  TM Distance: >3 FB Neck ROM: full    Dental  (+) Teeth Intact   Pulmonary neg pulmonary ROS, Current Smoker,    Pulmonary exam normal        Cardiovascular Exercise Tolerance: Good hypertension, + Peripheral Vascular Disease  negative cardio ROS Normal cardiovascular exam Rhythm:regular Rate:Normal     Neuro/Psych negative neurological ROS  negative psych ROS   GI/Hepatic negative GI ROS, Neg liver ROS,   Endo/Other  negative endocrine ROS  Renal/GU negative Renal ROS  negative genitourinary   Musculoskeletal   Abdominal   Peds  Hematology negative hematology ROS (+)   Anesthesia Other Findings   Reproductive/Obstetrics negative OB ROS                             Anesthesia Physical Anesthesia Plan  ASA: II  Anesthesia Plan: General LMA   Post-op Pain Management:    Induction:   Airway Management Planned:   Additional Equipment:   Intra-op Plan:   Post-operative Plan:   Informed Consent: I have reviewed the patients History and Physical, chart, labs and discussed the procedure including the risks, benefits and alternatives for the proposed anesthesia with the patient or authorized representative who has indicated his/her understanding and acceptance.     Plan Discussed with: CRNA  Anesthesia Plan Comments:         Anesthesia Quick Evaluation

## 2016-02-02 NOTE — Discharge Instructions (Addendum)
AMBULATORY SURGERY  DISCHARGE INSTRUCTIONS   1) The drugs that you were given will stay in your system until tomorrow so for the next 24 hours you should not:  A) Drive an automobile B) Make any legal decisions C) Drink any alcoholic beverage   2) You may resume regular meals tomorrow.  Today it is better to start with liquids and gradually work up to solid foods.  You may eat anything you prefer, but it is better to start with liquids, then soup and crackers, and gradually work up to solid foods.   3) Please notify your doctor immediately if you have any unusual bleeding, trouble breathing, redness and pain at the surgery site, drainage, fever, or pain not relieved by medication.        Please contact your physician with any problems or Same Day Surgery at [phone], Monday through Friday 6 am to 4 pm, or [place] at [facility] number at [phone].    Urethrotomy, Male, Care After  Refer to this sheet in the next few weeks. These instructions provide you with information on caring for yourself after your procedure. Your health care provider may also give you more specific instructions. Your treatment has been planned according to current medical practices, but problems sometimes occur. Call your health care provider if you have any problems or questions after your procedure. WHAT TO EXPECT AFTER THE PROCEDURE  After your procedure, it is typical to have the following:   Pain.  Burning pain when passing urine.  A small amount of blood in your urine.  Bloody urine leaking from around your catheter. HOME CARE INSTRUCTIONS Follow all your health care provider's home care instructions carefully. These may include:  Take all medicines as directed by your health care provider.  Follow catheter care as prescribed by your surgeon.  You may have a gauze pad on the tip of your penis. Change this pad often to keep it clean and dry. You can take a shower with your  catheter in place.  Do not bathe, swim, or use a hot tub until after your catheter is removed.  Return to your health care provider as directed to have your catheter removed.  If you have to do self-catheterization after your catheter is taken out, make sure you understand the procedure completely. Follow your instructions carefully. Ask your health care provider if you have any questions.  You may eat what you usually do.  Drink enough fluid to keep your urine clear or pale yellow.  Take at least two 10-minute walks each day.  Do not lift anything heavier than 10 lb (4.5 kg) until directed by your health care provider.  Ask your health care provider when you can go back to work and do all your usual activities, including sex.  Schedule and attend follow-up visits as directed by your health care provider. It is important to keep all your appointments. SEEK MEDICAL CARE IF:  You have chills.  You have a fever.  Your pain is not relieved by medicine.  You continue to have blood in your urine longer than expected by your health care provider. SEEK IMMEDIATE MEDICAL CARE IF:  Your pain gets worse and is not relieved by medicine.  You have heavy bleeding or clots in your urine.  You have trouble passing urine after your catheter is removed.  You have chest pain or trouble breathing.   This information is not intended to replace advice given to you by your health care  provider. Make sure you discuss any questions you have with your health care provider.   Document Released: 07/16/2013 Document Revised: 11/25/2014 Document Reviewed: 07/16/2013 Elsevier Interactive Patient Education 2016 [place], [place]  Urethrotomy is a surgical procedure to treat a section of the urethra that is too narrow. The urethra is the tube that carries urine from your bladder out through the tip of your penis. Narrowing  of the urethra (urethral stricture) makes it hard or painful to pass urine. You may also be at risk for more frequent urinary infections. Scarring from infection or injury are common causes of urethral stricture. During urethrotomy, your surgeon first passes a thin telescope (cystoscope) into your urethra. Then the surgeon uses a cutting instrument (cold knife) or a heat source (hot knife) to remove any urethral strictures.  LET [facility] CARE PROVIDER KNOW ABOUT:  Any allergies you have.  All medicines you are taking, including vitamins, herbs, eye drops, creams, and over-the-counter medicines.  Previous problems you or members of your family have had with the use of anesthetics.  Any blood disorders you have.  Previous surgeries you have had.  Medical conditions you have.  Any pacemaker or defibrillator. RISKS AND COMPLICATIONS Generally, this is a safe procedure. However, as with any procedure, problems can occur. The most common problem is having another urethral stricture after surgery. Other less common problems include:   Bleeding.  Burning pain.  Infection.  Needing to put a tube (catheter) in your urethra to prevent the stricture from happening again.  Trouble getting an erection (erectile dysfunction). BEFORE THE PROCEDURE  Do not eat or drink anything after midnight on the night before the procedure or as directed by your health care provider.  Ask your health care provider about changing or stopping your regular medicines. This is especially important if you are taking diabetes medicines or blood thinners.  You may need to have a urine test to check for signs of infection.  You may get antibiotic medicines by injection or through an IV access just before the procedure. This is to prevent infection.  Take a shower before coming to the hospital for surgery. PROCEDURE This procedure usually takes about 30 minutes. During the procedure, the following may  happen:  You will be given one of the following:  A medicine that makes you go to sleep (general anesthetic).  A medicine injected into your spine that numbs your body below the waist (spinal anesthetic).  The tip of your penis will be cleaned with a germ-killing (antibacterial) solution.  The surgeon will pass the cystoscope into your urethra.  Urethral strictures will be cut with the cold or hot knife.  You will most likely have a catheter inserted through your urethra and into your bladder. This is to drain your urine. AFTER THE PROCEDURE  You will stay in the recovery area until the anesthesia wears off and your health care provider says you can go home. Follow all instructions carefully.  Take all medicines as directed by your health care provider.  You may get prescriptions for antibiotics and a pain reliever.  You  may have a small bandage on the tip of your penis.  You may be sent home with a catheter in place. Follow your health care provider's instructions on how to care for the catheter at home. You will need to return to have the catheter removed as directed by your health care provider.  You may see some blood leaking around the catheter when you pass urine.  Drink enough fluid to keep your urine clear or pale yellow.  Ask your health care provider when you can go back to your normal activities after the catheter is removed.  Keep all follow-up appointments.   This information is not intended to replace advice given to you by your health care provider. Make sure you discuss any questions you have with your health care provider.   Document Released: 07/16/2013 Document Revised: 11/25/2014 Document Reviewed: 07/16/2013 Elsevier Interactive Patient Education [employer].   .

## 2016-02-03 ENCOUNTER — Encounter: Payer: Self-pay | Admitting: Urology

## 2016-02-05 ENCOUNTER — Encounter: Payer: Self-pay | Admitting: Urology

## 2016-07-29 DIAGNOSIS — E785 Hyperlipidemia, unspecified: Secondary | ICD-10-CM | POA: Diagnosis not present

## 2016-07-29 DIAGNOSIS — M545 Low back pain: Secondary | ICD-10-CM | POA: Diagnosis not present

## 2016-07-29 DIAGNOSIS — I739 Peripheral vascular disease, unspecified: Secondary | ICD-10-CM | POA: Diagnosis not present

## 2016-07-29 DIAGNOSIS — G8929 Other chronic pain: Secondary | ICD-10-CM | POA: Diagnosis not present

## 2016-07-29 DIAGNOSIS — I10 Essential (primary) hypertension: Secondary | ICD-10-CM | POA: Diagnosis not present

## 2016-07-29 DIAGNOSIS — M109 Gout, unspecified: Secondary | ICD-10-CM | POA: Diagnosis not present

## 2016-07-29 DIAGNOSIS — F17200 Nicotine dependence, unspecified, uncomplicated: Secondary | ICD-10-CM | POA: Diagnosis not present

## 2016-07-29 DIAGNOSIS — R109 Unspecified abdominal pain: Secondary | ICD-10-CM | POA: Diagnosis not present

## 2016-07-29 DIAGNOSIS — N400 Enlarged prostate without lower urinary tract symptoms: Secondary | ICD-10-CM | POA: Diagnosis not present

## 2016-07-29 DIAGNOSIS — D649 Anemia, unspecified: Secondary | ICD-10-CM | POA: Diagnosis not present

## 2016-07-29 DIAGNOSIS — R102 Pelvic and perineal pain: Secondary | ICD-10-CM | POA: Diagnosis not present

## 2016-08-19 DIAGNOSIS — M545 Low back pain: Secondary | ICD-10-CM | POA: Diagnosis not present

## 2016-08-19 DIAGNOSIS — F17200 Nicotine dependence, unspecified, uncomplicated: Secondary | ICD-10-CM | POA: Diagnosis not present

## 2016-08-19 DIAGNOSIS — M109 Gout, unspecified: Secondary | ICD-10-CM | POA: Diagnosis not present

## 2016-08-19 DIAGNOSIS — I10 Essential (primary) hypertension: Secondary | ICD-10-CM | POA: Diagnosis not present

## 2016-08-19 DIAGNOSIS — N400 Enlarged prostate without lower urinary tract symptoms: Secondary | ICD-10-CM | POA: Diagnosis not present

## 2016-08-19 DIAGNOSIS — I739 Peripheral vascular disease, unspecified: Secondary | ICD-10-CM | POA: Diagnosis not present

## 2016-08-19 DIAGNOSIS — G8929 Other chronic pain: Secondary | ICD-10-CM | POA: Diagnosis not present

## 2016-08-19 DIAGNOSIS — D649 Anemia, unspecified: Secondary | ICD-10-CM | POA: Diagnosis not present

## 2016-08-19 DIAGNOSIS — E785 Hyperlipidemia, unspecified: Secondary | ICD-10-CM | POA: Diagnosis not present

## 2016-08-19 DIAGNOSIS — R109 Unspecified abdominal pain: Secondary | ICD-10-CM | POA: Diagnosis not present

## 2016-09-27 DIAGNOSIS — F17200 Nicotine dependence, unspecified, uncomplicated: Secondary | ICD-10-CM | POA: Diagnosis not present

## 2016-09-27 DIAGNOSIS — M109 Gout, unspecified: Secondary | ICD-10-CM | POA: Diagnosis not present

## 2016-09-27 DIAGNOSIS — N400 Enlarged prostate without lower urinary tract symptoms: Secondary | ICD-10-CM | POA: Diagnosis not present

## 2016-09-27 DIAGNOSIS — D649 Anemia, unspecified: Secondary | ICD-10-CM | POA: Diagnosis not present

## 2016-09-27 DIAGNOSIS — G8929 Other chronic pain: Secondary | ICD-10-CM | POA: Diagnosis not present

## 2016-09-27 DIAGNOSIS — E785 Hyperlipidemia, unspecified: Secondary | ICD-10-CM | POA: Diagnosis not present

## 2016-09-27 DIAGNOSIS — R109 Unspecified abdominal pain: Secondary | ICD-10-CM | POA: Diagnosis not present

## 2016-09-27 DIAGNOSIS — I739 Peripheral vascular disease, unspecified: Secondary | ICD-10-CM | POA: Diagnosis not present

## 2016-09-27 DIAGNOSIS — M545 Low back pain: Secondary | ICD-10-CM | POA: Diagnosis not present

## 2016-09-27 DIAGNOSIS — I10 Essential (primary) hypertension: Secondary | ICD-10-CM | POA: Diagnosis not present

## 2016-09-27 DIAGNOSIS — R0989 Other specified symptoms and signs involving the circulatory and respiratory systems: Secondary | ICD-10-CM | POA: Diagnosis not present

## 2016-10-18 DIAGNOSIS — I10 Essential (primary) hypertension: Secondary | ICD-10-CM | POA: Diagnosis not present

## 2016-10-18 DIAGNOSIS — E785 Hyperlipidemia, unspecified: Secondary | ICD-10-CM | POA: Diagnosis not present

## 2016-10-19 DIAGNOSIS — G8929 Other chronic pain: Secondary | ICD-10-CM | POA: Diagnosis not present

## 2016-10-19 DIAGNOSIS — I10 Essential (primary) hypertension: Secondary | ICD-10-CM | POA: Diagnosis not present

## 2016-10-19 DIAGNOSIS — F17200 Nicotine dependence, unspecified, uncomplicated: Secondary | ICD-10-CM | POA: Diagnosis not present

## 2016-10-19 DIAGNOSIS — M545 Low back pain: Secondary | ICD-10-CM | POA: Diagnosis not present

## 2016-10-19 DIAGNOSIS — D649 Anemia, unspecified: Secondary | ICD-10-CM | POA: Diagnosis not present

## 2016-10-19 DIAGNOSIS — N400 Enlarged prostate without lower urinary tract symptoms: Secondary | ICD-10-CM | POA: Diagnosis not present

## 2016-10-19 DIAGNOSIS — M109 Gout, unspecified: Secondary | ICD-10-CM | POA: Diagnosis not present

## 2016-10-19 DIAGNOSIS — I739 Peripheral vascular disease, unspecified: Secondary | ICD-10-CM | POA: Diagnosis not present

## 2016-10-19 DIAGNOSIS — E785 Hyperlipidemia, unspecified: Secondary | ICD-10-CM | POA: Diagnosis not present

## 2016-11-01 ENCOUNTER — Ambulatory Visit: Payer: PPO | Attending: Pain Medicine | Admitting: Pain Medicine

## 2016-11-01 ENCOUNTER — Encounter: Payer: Self-pay | Admitting: Pain Medicine

## 2016-11-01 VITALS — BP 144/60 | HR 75 | Temp 97.9°F | Resp 18 | Ht 71.0 in | Wt 192.0 lb

## 2016-11-01 DIAGNOSIS — M48061 Spinal stenosis, lumbar region without neurogenic claudication: Secondary | ICD-10-CM | POA: Diagnosis not present

## 2016-11-01 DIAGNOSIS — M961 Postlaminectomy syndrome, not elsewhere classified: Secondary | ICD-10-CM | POA: Diagnosis not present

## 2016-11-01 DIAGNOSIS — M4726 Other spondylosis with radiculopathy, lumbar region: Secondary | ICD-10-CM | POA: Diagnosis not present

## 2016-11-01 DIAGNOSIS — I739 Peripheral vascular disease, unspecified: Secondary | ICD-10-CM | POA: Insufficient documentation

## 2016-11-01 DIAGNOSIS — R937 Abnormal findings on diagnostic imaging of other parts of musculoskeletal system: Secondary | ICD-10-CM

## 2016-11-01 DIAGNOSIS — I10 Essential (primary) hypertension: Secondary | ICD-10-CM | POA: Diagnosis not present

## 2016-11-01 DIAGNOSIS — M47816 Spondylosis without myelopathy or radiculopathy, lumbar region: Secondary | ICD-10-CM | POA: Insufficient documentation

## 2016-11-01 DIAGNOSIS — M5442 Lumbago with sciatica, left side: Secondary | ICD-10-CM

## 2016-11-01 DIAGNOSIS — Z79891 Long term (current) use of opiate analgesic: Secondary | ICD-10-CM | POA: Diagnosis not present

## 2016-11-01 DIAGNOSIS — F1190 Opioid use, unspecified, uncomplicated: Secondary | ICD-10-CM

## 2016-11-01 DIAGNOSIS — M79605 Pain in left leg: Secondary | ICD-10-CM | POA: Diagnosis not present

## 2016-11-01 DIAGNOSIS — M545 Low back pain: Secondary | ICD-10-CM | POA: Diagnosis not present

## 2016-11-01 DIAGNOSIS — Z7982 Long term (current) use of aspirin: Secondary | ICD-10-CM | POA: Insufficient documentation

## 2016-11-01 DIAGNOSIS — M792 Neuralgia and neuritis, unspecified: Secondary | ICD-10-CM

## 2016-11-01 DIAGNOSIS — M109 Gout, unspecified: Secondary | ICD-10-CM | POA: Diagnosis not present

## 2016-11-01 DIAGNOSIS — M79604 Pain in right leg: Secondary | ICD-10-CM

## 2016-11-01 DIAGNOSIS — M48062 Spinal stenosis, lumbar region with neurogenic claudication: Secondary | ICD-10-CM | POA: Diagnosis not present

## 2016-11-01 DIAGNOSIS — G894 Chronic pain syndrome: Secondary | ICD-10-CM

## 2016-11-01 DIAGNOSIS — G8929 Other chronic pain: Secondary | ICD-10-CM

## 2016-11-01 DIAGNOSIS — M5441 Lumbago with sciatica, right side: Secondary | ICD-10-CM

## 2016-11-01 DIAGNOSIS — M47896 Other spondylosis, lumbar region: Secondary | ICD-10-CM

## 2016-11-01 DIAGNOSIS — M5410 Radiculopathy, site unspecified: Secondary | ICD-10-CM | POA: Diagnosis not present

## 2016-11-01 DIAGNOSIS — E782 Mixed hyperlipidemia: Secondary | ICD-10-CM | POA: Diagnosis not present

## 2016-11-01 DIAGNOSIS — D649 Anemia, unspecified: Secondary | ICD-10-CM | POA: Insufficient documentation

## 2016-11-01 DIAGNOSIS — Z8546 Personal history of malignant neoplasm of prostate: Secondary | ICD-10-CM

## 2016-11-01 DIAGNOSIS — Z8619 Personal history of other infectious and parasitic diseases: Secondary | ICD-10-CM

## 2016-11-01 DIAGNOSIS — M9983 Other biomechanical lesions of lumbar region: Secondary | ICD-10-CM

## 2016-11-01 NOTE — Progress Notes (Addendum)
Patient's Name: Duane Owens  MRN: [id number]  Referring Provider: [name], MD  DOB: Aug 29, 1940  PCP: [name], MD  DOS: 11/01/2016  Note by: Kathlen Brunswick. Dossie Arbour, MD  Service setting: Ambulatory outpatient  Specialty: Interventional Pain Management  Location: [facility] (AMB) Pain Management Facility    Patient type: New Patient   Primary Reason(s) for Visit: Initial Patient Evaluation CC: Back Pain (lower)  HPI  Duane Owens is a 76 y.o. year old, male patient, who comes today for an initial evaluation. He has Chronic low back pain (Location of Primary Source of Pain) (Bilateral) (R>L); Chronic lower extremity pain (Location of Secondary source of pain) (Right); Chronic pain syndrome; Long term current use of opiate analgesic; Long term prescription opiate use; Opiate use (45 MME/Day); Abnormal MRI, lumbar spine (05/28/2014); Failed back surgical syndrome x 2 (decompression by Dr. Albesa Seen); Lumbar central spinal stenosis (severe at L2-3 and L3-4); Lumbar lateral recess stenosis (Left L3-4; Bilateral L5; Right L4-5); Chronic radicular pain of lower extremity; Lumbar facet hypertrophy; Lumbar spondylosis; Lumbar foraminal stenosis (L4-5) (Bilateral) (R>L); Neurogenic pain; Gout; Mixed hyperlipidemia; Essential hypertension, benign; History of hepatitis; and History of prostate cancer on his problem list.. His primarily concern today is the Back Pain (lower)  Pain Assessment: Self-Reported Pain Score: 4 /10             Reported level is compatible with observation.       Pain Type: Chronic pain Pain Location: Back Pain Orientation: Lower Pain Descriptors / Indicators: Aching, Nagging Pain Frequency: Intermittent  Onset and Duration: Gradual and Present longer than 3 months Cause of pain: Unknown Severity: No change since onset, NAS-11 at its worse: 10/10, NAS-11 at its best: 4/10, NAS-11 now: 6/10 and NAS-11 on the average: 5/10 Timing: Night and After activity or  exercise Aggravating Factors: Lifiting Alleviating Factors: Medications Associated Problems: Night-time cramps and Pain that does not allow patient to sleep Quality of Pain: Agonizing and Work related Previous Examinations or Tests: MRI scan, Spinal tap and X-rays Previous Treatments: Epidural steroid injections and Narcotic medications  The patient comes into the clinics today for the first time for a chronic pain management evaluation. According to the patient his primary's area of pain is that of the lower back with the right side being worst on the left. He indicates having had 2 back surgeries done by Dr. Albesa Seen with the first one having been approximately 16 years ago due to low back pain and right lower extremity pain. Approximately 4 years later, 12 years ago, he experienced a recurrence of the low back pain and right lower extremity pain for which she underwent a second lumbar surgery. He indicates having had nerve blocks [facility] at [facility] [place]. The last one was done around December 2017 and he indicates that it did not help. He denies any prior physical therapy and he indicates that his last MRI of the lumbar spine was a couple years ago. In reviewing the available MRIs we see that he had one done in 2015 that indicates that he is having severe lumbar spinal stenosis at a couple of levels including the L2-3, L3-4, L4-5, and this is accompanied by lateral recess stenosis and nerve compression. He admits having bilateral lower extremity numbness. His second area pain is that of the right lower extremity. He indicates having had a stent put in the leg secondary to poor circulation. He currently takes 325 mg of aspirin per day to help  with that. He denies any nerve blocks, physical therapy, or recent x-rays of the lower extremities. He indicates controlling his pain with oxycodone 7.5 mg every 6 hours as well as when necessary ibuprofen.  Today I took the time to  provide the patient with information regarding my pain practice. The patient was informed that my practice is divided into two sections: an interventional pain management section, as well as a completely separate and distinct medication management section. I explained that I have procedure days for my interventional therapies, and evaluation days for follow-ups and medication management. Because of the amount of documentation required during both, they are kept separated. This means that there is the possibility that he may be scheduled for a procedure on one day, and medication management the next. I have also informed him that because of staffing and facility limitations, I no longer take patients for medication management only. To illustrate the reasons for this, I gave the patient the example of surgeons, and how inappropriate it would be to refer a patient to his/her care, just to write for the post-surgical antibiotics on a surgery done by a different surgeon.   Because interventional pain management is my board-certified specialty, the patient was informed that joining my practice means that they are open to any and all interventional therapies. I made it clear that this does not mean that they will be forced to have any procedures done. What this means is that I believe interventional therapies to be essential part of the diagnosis and proper management of chronic pain conditions. Therefore, patients not interested in these interventional alternatives will be better served under the care of a different practitioner.  The patient was also made aware of my Comprehensive Pain Management Safety Guidelines where by joining my practice, they limit all of their nerve blocks and joint injections to those done by our practice, for as long as we are retained to manage their care.   Historic Controlled Substance Pharmacotherapy Review  PMP and historical list of controlled substances: Oxycodone/APAP 7.5/325 one  tablet by mouth 4 times a day; hydrocodone/APAP 10/325, 5 tablets per day. Highest analgesic regimen found: Oxycodone/APAP 7.5/325 one tablet 5 times a day. Most recent analgesic: Oxycodone/APAP 7.5/325 one tablet every 6 hours (30 mg/day oxycodone) Highest recorded MME/day: 56.25 mg/day MME/day: 45 mg/day Medications: The patient did not bring the medication(s) to the appointment, as requested in our "New Patient Package" Pharmacodynamics: Desired effects: Analgesia: The patient reports >50% benefit. Reported improvement in function: The patient reports medication allows him to accomplish basic ADLs. Clinically meaningful improvement in function (CMIF): Sustained CMIF goals met Perceived effectiveness: Described as relatively effective, allowing for increase in activities of daily living (ADL) Undesirable effects: Side-effects or Adverse reactions: None reported Historical Monitoring: The patient  reports that he does not use drugs.. List of all UDS Test(s): No results found for: MDMA, COCAINSCRNUR, PCPSCRNUR, PCPQUANT, CANNABQUANT, THCU, [place] List of all Serum Drug Screening Test(s):  No results found for: AMPHSCRSER, BARBSCRSER, BENZOSCRSER, COCAINSCRSER, PCPSCRSER, PCPQUANT, THCSCRSER, CANNABQUANT, OPIATESCRSER, OXYSCRSER, PROPOXSCRSER Historical Background Evaluation: [place] PDMP: Six (6) year initial data search conducted.             [place] Department of public safety, offender search: Editor, commissioning Information) Non-contributory Risk Assessment Profile: Aberrant behavior: None observed or detected today Risk factors for fatal opioid overdose: None identified today Fatal overdose hazard ratio (HR): Calculation deferred Non-fatal overdose hazard ratio (HR): Calculation deferred Risk of opioid abuse or dependence: 0.7-3.0% with doses ?  36 MME/day and 6.1-26% with doses ? 120 MME/day. Substance use disorder (SUD) risk level: Pending results of Medical Psychology Evaluation for SUD Opioid risk tool  (ORT) (Total Score): 0  ORT Scoring interpretation table:  Score <3 = Low Risk for SUD  Score between 4-7 = Moderate Risk for SUD  Score >8 = High Risk for Opioid Abuse   PHQ-2 Depression Scale:  Total score: 0  PHQ-2 Scoring interpretation table: (Score and probability of major depressive disorder)  Score 0 = No depression  Score 1 = 15.4% Probability  Score 2 = 21.1% Probability  Score 3 = 38.4% Probability  Score 4 = 45.5% Probability  Score 5 = 56.4% Probability  Score 6 = 78.6% Probability   PHQ-9 Depression Scale:  Total score: 0  PHQ-9 Scoring interpretation table:  Score 0-4 = No depression  Score 5-9 = Mild depression  Score 10-14 = Moderate depression  Score 15-19 = Moderately severe depression  Score 20-27 = Severe depression (2.4 times higher risk of SUD and 2.89 times higher risk of overuse)   Pharmacologic Plan: Pending ordered tests and/or consults  Meds  The patient has a current medication list which includes the following prescription(s): allopurinol, aspirin, hydrochlorothiazide, and oxycodone-acetaminophen.  Current Outpatient Prescriptions on File Prior to Visit  Medication Sig  . allopurinol (ZYLOPRIM) 300 MG tablet Take 300 mg by mouth daily.  . hydrochlorothiazide (HYDRODIURIL) 12.5 MG tablet Take 12.5 mg by mouth daily.   No current facility-administered medications on file prior to visit.    Imaging Review  Lumbosacral Imaging: Lumbar MR wo contrast:  Results for orders placed in visit on 03/22/13  MR L Spine Ltd W/O Cm   Narrative * PRIOR REPORT IMPORTED FROM AN EXTERNAL SYSTEM *   PRIOR REPORT IMPORTED FROM THE [facility] WORKFLOW SYSTEM   REASON FOR EXAM:    RT leg claudication sciatica  RT lower extr weakness  COMMENTS:   PROCEDURE:     MR  - MR LUMBAR SPINE WO CONTRAST  - Mar 22 2013 12:20PM   RESULT:     MRI LUMBAR SPINE WITHOUT CONTRAST   HISTORY: Right leg claudication   COMPARISON: None   TECHNIQUE: Multiplanar and multisequence  MRI of the lumbar spine were  obtained, without administration of IV contrast.   FINDINGS:   The vertebral bodies of the lumbar spine are normal in size and alignment.  There is normal bone marrow signal demonstrated throughout the vertebra.  There is degenerative disc disease at L4-L5.   The spinal cord is of normal volume and contour. The cord terminates  normally at the L1 . The nerve roots of the cauda equina and the filum  terminale have the usual appearance.   The visualized portions of the SI joints are unremarkable.   The imaged intra-abdominal contents are unremarkable.   T12-L1: Mild broad-based disc bulge. Mild bilateral facet arthropathy. No  evidence of neural foraminal or central stenosis.   L1-L2: Mild broad-based disc bulge. No evidence of neural foraminal or  central stenosis.   L2-L3: Moderate broad-based disc bulge with a superimposed small right  paracentral disc protrusion. Moderate bilateral facet arthropathy with  ligamentum flavum infolding resulting in severe spinal stenosis. There is  no  foraminal stenosis.   L3-L4: Mild broad-based disc bulge. Severe bilateral facet arthropathy  with  ligamentum flavum infolding resulting in mild spinal stenosis and severe  lateral recess stenosis. There is no foraminal stenosis.   L4-L5: Mild broad-based disc bulge with a  superimposed right paracentral  disc protrusion versus scar tissue. There is bilateral facet arthropathy  resulting in mild spinal stenosis. There is moderate right foraminal  stenosis. There is mild left foraminal stenosis.   L5-S1: Mild broad-based disc bulge. Mild bilateral facet arthropathy. No  evidence of neural foraminal or central stenosis.   IMPRESSION:   1. Lumbar spine spondylosis as described above.   Dictation Site: 1       Lumbar MR w/wo contrast:  Results for orders placed in visit on 05/28/14  MR Lumbar Spine W Wo Contrast   Narrative * PRIOR REPORT IMPORTED FROM AN  EXTERNAL SYSTEM *   CLINICAL DATA:  Low back pain and bilateral leg pain. Bilateral leg  weakness. Two prior lumbar surgeries.   EXAM:  MRI LUMBAR SPINE WITHOUT AND WITH CONTRAST   TECHNIQUE:  Multiplanar and multiecho pulse sequences of the lumbar spine were  obtained without and with intravenous contrast.   CONTRAST:  70 cc MultiHance   COMPARISON:  MRI dated 03/22/2013   FINDINGS:  Normal conus tip at L1-2.  Normal paraspinal soft tissues.   T12-L1: Small disc bulge asymmetric into the right neural foramen  with no neural impingement, unchanged.   L1-2: Small broad-based disc bulge with no neural impingement,  unchanged.   L2-3: Small broad-based disc protrusion extending into both foramina  an extra foraminal on the left. Hypertrophy of the ligamentum flavum  and facet joints combine to create severe spinal stenosis. The  nerves are slightly trapped at this level. No significant change.   L3-4: Small broad-based disc bulge, most prominent into the neural  foramina. Hypertrophy of the ligamentum flavum and facet joints  creates moderate spinal stenosis and left lateral recess  compression, unchanged. This could affect the left L4 nerve.   L4-5: Marked disc space narrowing. Prominent osteophytes protrude  into the right lateral recess and paracentral to the right and into  both neural foramina with moderately severe right foraminal  stenosis. The patient has had previous posterior decompression. The  appearance is unchanged.   L5-S1: Small broad-based disc bulge with bilateral lateral recess  stenosis which could affect either or both S1 nerves. Hypertrophy of  the ligamentum flavum and facet joints contributes to the lateral  recess stenosis. The appearance is unchanged. No   IMPRESSION:  1. Severe spinal stenosis at L2-3 with trapping of the nerve roots.  2. Moderately severe spinal stenosis at L3-4 with left lateral  recess compression.  3. Bilateral lateral  recess compression at L5.  4. Chronic impingement upon the right lateral recess and right side  of the thecal sac at L4-5.  5. There has been no significant change since the prior exam of  03/22/2013.    Electronically Signed    By: Rozetta Nunnery M.D.    On: 05/28/2014 13:19       Note: Available results from prior imaging studies were reviewed.        ROS  Cardiovascular History: Hypertension Pulmonary or Respiratory History: Negative for bronchial asthma, emphysema, chronic smoking, chronic bronchitis, sarcoidosis, tuberculosis or sleep apena Neurological History: Negative for epilepsy, stroke, urinary or fecal inontinence, spina bifida or tethered cord syndrome Review of Past Neurological Studies: No results found for this or any previous visit. Psychological-Psychiatric History: Negative for anxiety, depression, schizophrenia, bipolar disorders or suicidal ideations or attempts Gastrointestinal History: Negative for peptic ulcer disease, hiatal hernia, GERD, IBS, hepatitis, cirrhosis or pancreatitis Genitourinary History: Hematuria Hematological History: Negative for anticoagulant therapy, anemia,  bruising or bleeding easily, hemophilia, sickle cell disease or trait, thrombocytopenia or coagulupathies Endocrine History: Negative for diabetes or thyroid disease Rheumatologic History: Negative for lupus, osteoarthritis, rheumatoid arthritis, myositis, polymyositis or fibromyagia Musculoskeletal History: Negative for myasthenia gravis, muscular dystrophy, multiple sclerosis or malignant hyperthermia Work History: Working part time  Allergies  Mr. Urieta has No Known Allergies.  Laboratory Chemistry  Inflammation Markers No results found for: CRP, ESRSEDRATE (CRP: Acute Phase) (ESR: Chronic Phase) Renal Function Markers Lab Results  Component Value Date   BUN 29 (H) 08/11/2015   CREATININE 1.10 08/11/2015   GFRAA >60 08/11/2015   GFRNONAA >60 08/11/2015   Hepatic Function  Markers No results found for: AST, ALT, ALBUMIN, ALKPHOS, HCVAB Electrolytes Lab Results  Component Value Date   K 4.1 08/11/2015   Neuropathy Markers No results found for: GNFAOZHY86 Bone Pathology Markers No results found for: Hendricks Milo, VD125OH2TOT, VH8469GE9, BM8413KG4, 25OHVITD1, 25OHVITD2, 25OHVITD3, CALCIUM, TESTOFREE, TESTOSTERONE Coagulation Parameters No results found for: INR, LABPROT, APTT, PLT Cardiovascular Markers No results found for: BNP, HGB, HCT Note: Lab results reviewed.  [place]  Drug: Mr. Hattabaugh  reports that he does not use drugs. Alcohol:  reports that he does not drink alcohol. Tobacco:  reports that he has been smoking Cigarettes.  He has a 2.50 pack-year smoking history. He has never used smokeless tobacco. Medical:  has a past medical history of Anemia; Atherosclerosis; Cancer ([place]); Hypercholesteremia; Hyperlipemia; Hypertension; Spinal stenosis; Varicose vein; and Venous insufficiency. Family: family history is not on file.  Past Surgical History:  Procedure Laterality Date  . BACK SURGERY    . CYSTOSCOPY WITH DIRECT VISION INTERNAL URETHROTOMY N/A 02/02/2016   Procedure: CYSTOSCOPY WITH DIRECT VISION INTERNAL URETHROTOMY;  Surgeon: Royston Cowper, MD;  Location: [facility] OR[facility];  Service: Urology;  Laterality: N/A;  . HOLMIUM LASER APPLICATION N/A 0/04/2724   Procedure: HOLMIUM LASER APPLICATION;  Surgeon: Royston Cowper, MD;  Location: [facility];  Service: Urology;  Laterality: N/A;  . PERIPHERAL VASCULAR CATHETERIZATION N/A 08/11/2015   Procedure: Abdominal Aortogram w/Lower Extremity;  Surgeon: Katha Cabal, MD;  Location: [facility];  Service: Cardiovascular;  Laterality: N/A;  . PERIPHERAL VASCULAR CATHETERIZATION  08/11/2015   Procedure: Lower Extremity Intervention;  Surgeon: Katha Cabal, MD;  Location: [place] [facility];  Service: Cardiovascular;;  . TONSILLECTOMY     Active Ambulatory Problems    Diagnosis Date Noted   . Chronic low back pain (Location of Primary Source of Pain) (Bilateral) (R>L) 11/01/2016  . Chronic lower extremity pain (Location of Secondary source of pain) (Right) 11/01/2016  . Chronic pain syndrome 11/01/2016  . Long term current use of opiate analgesic 11/01/2016  . Long term prescription opiate use 11/01/2016  . Opiate use (45 MME/Day) 11/01/2016  . Abnormal MRI, lumbar spine (05/28/2014) 11/01/2016  . 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 Lumbar central spinal stenosis (severe at L2-3 and L3-4) 11/01/2016  . Lumbar lateral recess stenosis (Left L3-4; Bilateral L5; Right L4-5) 11/01/2016  . Chronic radicular pain of lower extremity 11/01/2016  . Lumbar facet hypertrophy 11/01/2016  . Lumbar spondylosis 11/01/2016  . Lumbar foraminal stenosis (L4-5) (Bilateral) (R>L) 11/01/2016  . Neurogenic pain 11/01/2016  . Gout 11/01/2016  . Mixed hyperlipidemia 11/01/2016  . Essential hypertension, benign 11/01/2016  . History of hepatitis 11/01/2016  . History of prostate cancer 11/01/2016   Resolved Ambulatory Problems    Diagnosis Date Noted  . No Resolved Ambulatory  Problems   Past Medical History:  Diagnosis Date  . Anemia   . Atherosclerosis   . Cancer ([place])   . Hypercholesteremia   . Hyperlipemia   . Hypertension   . Spinal stenosis   . Varicose vein   . Venous insufficiency    Constitutional Exam  General appearance: Well nourished, well developed, and well hydrated. In no apparent acute distress Vitals:   11/01/16 1119  BP: (!) 144/60  Pulse: 75  Resp: 18  Temp: 97.9 F (36.6 C)  TempSrc: Oral  SpO2: 97%  Weight: 192 lb (87.1 kg)  Height: _0  (1.803 m)   BMI Assessment: Estimated body mass index is 26.78 kg/m as calculated from the following:   Height as of this encounter: _1  (1.803 m).   Weight as of this encounter: 192 lb (87.1 kg).  BMI interpretation table: BMI level Category Range association  with higher incidence of chronic pain  <18 kg/m2 Underweight   18.5-24.9 kg/m2 Ideal body weight   25-29.9 kg/m2 Overweight Increased incidence by 20%  30-34.9 kg/m2 Obese (Class I) Increased incidence by 68%  35-39.9 kg/m2 Severe obesity (Class II) Increased incidence by 136%  >40 kg/m2 Extreme obesity (Class III) Increased incidence by 254%   BMI Readings from Last 4 Encounters:  11/01/16 26.78 kg/m  02/02/16 25.77 kg/m  08/11/15 25.09 kg/m   Wt Readings from Last 4 Encounters:  11/01/16 192 lb (87.1 kg)  02/02/16 190 lb (86.2 kg)  08/11/15 185 lb (83.9 kg)  Psych/Mental status: Alert, oriented x 3 (person, place, & time)       Eyes: PERLA Respiratory: No evidence of acute respiratory distress  Cervical Spine Exam  Inspection: No masses, redness, or swelling Alignment: Symmetrical Functional ROM: Unrestricted ROM Stability: No instability detected Muscle strength & Tone: Functionally intact Sensory: Unimpaired Palpation: No palpable anomalies  Upper Extremity (UE) Exam    Side: Right upper extremity  Side: Left upper extremity  Inspection: No masses, redness, swelling, or asymmetry. No contractures  Inspection: No masses, redness, swelling, or asymmetry. No contractures  Functional ROM: Unrestricted ROM          Functional ROM: Unrestricted ROM          Muscle strength & Tone: Functionally intact  Muscle strength & Tone: Functionally intact  Sensory: Unimpaired  Sensory: Unimpaired  Palpation: No palpable anomalies  Palpation: No palpable anomalies  Specialized Test(s): Deferred         Specialized Test(s): Deferred          Thoracic Spine Exam  Inspection: No masses, redness, or swelling Alignment: Symmetrical Functional ROM: Unrestricted ROM Stability: No instability detected Sensory: Unimpaired Muscle strength & Tone: No palpable anomalies  Lumbar Spine Exam  Inspection: Well healed scar from previous spine surgery detected Alignment: Symmetrical Functional  ROM: Minimal ROM Stability: No instability detected Muscle strength & Tone: Functionally intact Sensory: Movement-associated pain Palpation: Tender Provocative Tests: Lumbar Hyperextension and rotation test: Positive bilaterally for facet joint pain. Patrick's Maneuver: evaluation deferred today              Gait & Posture Assessment  Ambulation: Unassisted Gait: Antalgic Posture: WNL   Lower Extremity Exam    Side: Right lower extremity  Side: Left lower extremity  Inspection: No masses, redness, swelling, or asymmetry. No contractures  Inspection: No masses, redness, swelling, or asymmetry. No contractures  Functional ROM: Unrestricted ROM          Functional ROM: Unrestricted ROM  Muscle strength & Tone: Functionally intact  Muscle strength & Tone: Functionally intact  Sensory: Unimpaired  Sensory: Unimpaired  Palpation: No palpable anomalies  Palpation: No palpable anomalies   Assessment  Primary Diagnosis & Pertinent Problem List: The primary encounter diagnosis was Chronic low back pain (Location of Primary Source of Pain) (Bilateral) (R>L). Diagnoses of Chronic lower extremity pain (Location of Secondary source of pain) (Right), Lumbar lateral recess stenosis (Left L3-4; Bilateral L5; Right L4-5), Lumbar central spinal stenosis (severe at L2-3 and L3-4), Failed back surgical syndrome x 2 (decompression by Dr. Albesa Seen), Chronic radicular pain of lower extremity, Chronic pain syndrome, Long term current use of opiate analgesic, Long term prescription opiate use, Opiate use, Abnormal MRI, lumbar spine (05/28/2014), Lumbar facet hypertrophy, Lumbar spondylosis, Lumbar foraminal stenosis (L4-5) (Bilateral) (R>L), Neurogenic pain, Mixed hyperlipidemia, Essential hypertension, benign, History of hepatitis, and History of prostate cancer were also pertinent to this visit.  Visit Diagnosis: 1. Chronic low back pain (Location of Primary Source of Pain) (Bilateral) (R>L)   2.  Chronic lower extremity pain (Location of Secondary source of pain) (Right)   3. Lumbar lateral recess stenosis (Left L3-4; Bilateral L5; Right L4-5)   4. Lumbar central spinal stenosis (severe at L2-3 and L3-4)   5. Failed back surgical syndrome x 2 (decompression by Dr. Albesa Seen)   6. Chronic radicular pain of lower extremity   7. Chronic pain syndrome   8. Long term current use of opiate analgesic   9. Long term prescription opiate use   10. Opiate use   11. Abnormal MRI, lumbar spine (05/28/2014)   12. Lumbar facet hypertrophy   13. Lumbar spondylosis   14. Lumbar foraminal stenosis (L4-5) (Bilateral) (R>L)   15. Neurogenic pain   16. Mixed hyperlipidemia   17. Essential hypertension, benign   18. History of hepatitis   19. History of prostate cancer    Plan of Care  Initial treatment plan:  Please be advised that as per protocol, today's visit has been an evaluation only. We have not taken over the patient's controlled substance management. After carefully having reviewed the patient's imaging reports, it is clear that we need to repeat his lumbar MRI with and without contrast. In addition, consideration should be taken to doing lower extremity nerve conduction testing. And therefore will be made at collecting the nerve blocks done at [facility] in [place]: No problem-specific [place] notes found for this encounter.  Ordered Lab-work, Procedure(s), Referral(s), & Consult(s): Orders Placed This Encounter  Procedures  . DG Lumbar Spine Complete W/Bend  . Compliance Drug Analysis, Ur  . Comprehensive metabolic panel  . C-reactive protein  . Magnesium  . Sedimentation rate  . Vitamin B12  . 25-Hydroxyvitamin D Lcms D2+D3  . Ambulatory referral to Psychology   Pharmacotherapy: Medications ordered:  No orders of the defined types were placed in this encounter.  Medications administered during this visit: Mr.  Blick had no medications administered during this visit.   Pharmacotherapy under consideration:  Opioid Analgesics: The patient was informed that there is no guarantee that he would be a candidate for opioid analgesics. The decision will be made following CDC guidelines. This decision will be based on the results of diagnostic studies, as well as Mr. Navarez risk profile.  Membrane stabilizer: Upon the patient's return, we will start a trial of gabapentin. Muscle relaxant: To be determined at a later time NSAID: To be determined at a later time  Other analgesic(s): To be determined at a later time   Interventional therapies under consideration: Mr. Buch was informed that there is no guarantee that he would be a candidate for interventional therapies. The decision will be based on the results of diagnostic studies, as well as Mr. Denner risk profile.  Possible procedure(s): Diagnostic bilateral lumbar facet block  Possible bilateral lumbar facet RFA  Diagnostic right-sided L2-3 interlaminar lumbar epidural steroid injection Diagnostic right-sided L3-4 interlaminar lumbar epidural steroid injection  Diagnostic bilateral L4-5 transforaminal epidural steroid injection  Diagnostic left L3-4 transforaminal epidural steroid injection  Diagnostic bilateral L5-S1 transforaminal epidural steroid injection  Diagnostic caudal epidural steroid injection + diagnostic epidurogram    Provider-requested follow-up: Return for 2nd Visit, after [facility] evaluation.  No future appointments.  Primary Care Physician: [name], MD Location: [facility] Note by: Kathlen Brunswick. Dossie Arbour, M.D, DABA, DABAPM, DABPM, DABIPP, FIPP Date: 11/01/2016; Time: 9:25 PM  Pain Score Disclaimer: We use the NRS-11 scale. This is a self-reported, subjective measurement of pain severity with only modest accuracy. It is used primarily to identify changes within a particular patient.  It must be understood that outpatient pain scales are significantly less accurate that those used for research, where they can be applied under ideal controlled circumstances with minimal exposure to variables. In reality, the score is likely to be a combination of pain intensity and pain affect, where pain affect describes the degree of emotional arousal or changes in action readiness caused by the sensory experience of pain. Factors such as social and work situation, setting, emotional state, anxiety levels, expectation, and prior pain experience may influence pain perception and show large inter-individual differences that may also be affected by time variables.  Patient instructions provided during this appointment: Patient Instructions   Pain Score  Introduction: The pain score used by this practice is the Verbal Numerical Rating Scale (VNRS-11). This is an 11-point scale. It is for adults and children 10 years or older. There are significant differences in how the pain score is reported, used, and applied. Forget everything you learned in the past and learn this scoring system.  General Information: The scale should reflect your current level of pain. Unless you are specifically asked for the level of your worst pain, or your average pain. If you are asked for one of these two, then it should be understood that it is over the past 24 hours.  Basic Activities of Daily Living (ADL): Personal hygiene, dressing, eating, transferring, and using restroom.  Instructions: Most patients tend to report their level of pain as a combination of two factors, their physical pain and their psychosocial pain. This last one is also known as "suffering" and it is reflection of how physical pain affects you socially and psychologically. From now on, report them separately. From this point on, when asked to report your pain level, report only your physical pain. Use the following table for reference.  Pain Clinic  Pain Levels (0-5/10)  Pain Level Score Description  No Pain 0   Mild pain 1 Nagging, annoying, but does not interfere with basic activities of daily living (ADL). Patients are able to eat, bathe, get dressed, toileting (being able to get on and off the toilet and perform personal hygiene functions), transfer (move in and out of bed or a chair without assistance), and maintain continence (able to control bladder and bowel functions). Blood pressure and heart rate are unaffected. A normal heart rate for a healthy adult ranges  from 60 to 100 bpm (beats per minute).   Mild to moderate pain 2 Noticeable and distracting. Impossible to hide from other people. More frequent flare-ups. Still possible to adapt and function close to normal. It can be very annoying and may have occasional stronger flare-ups. With discipline, patients may get used to it and adapt.   Moderate pain 3 Interferes significantly with activities of daily living (ADL). It becomes difficult to feed, bathe, get dressed, get on and off the toilet or to perform personal hygiene functions. Difficult to get in and out of bed or a chair without assistance. Very distracting. With effort, it can be ignored when deeply involved in activities.   Moderately severe pain 4 Impossible to ignore for more than a few minutes. With effort, patients may still be able to manage work or participate in some social activities. Very difficult to concentrate. Signs of autonomic nervous system discharge are evident: dilated pupils (mydriasis); mild sweating (diaphoresis); sleep interference. Heart rate becomes elevated (>115 bpm). Diastolic blood pressure (lower number) rises above 100 mmHg. Patients find relief in laying down and not moving.   Severe pain 5 Intense and extremely unpleasant. Associated with frowning face and frequent crying. Pain overwhelms the senses.  Ability to do any activity or maintain social relationships becomes significantly limited.  Conversation becomes difficult. Pacing back and forth is common, as getting into a comfortable position is nearly impossible. Pain wakes you up from deep sleep. Physical signs will be obvious: pupillary dilation; increased sweating; goosebumps; brisk reflexes; cold, clammy hands and feet; nausea, vomiting or dry heaves; loss of appetite; significant sleep disturbance with inability to fall asleep or to remain asleep. When persistent, significant weight loss is observed due to the complete loss of appetite and sleep deprivation.  Blood pressure and heart rate becomes significantly elevated. Caution: If elevated blood pressure triggers a pounding headache associated with blurred vision, then the patient should immediately seek attention at an urgent or emergency care unit, as these may be signs of an impending stroke.    Emergency Department Pain Levels (6-10/10)  Emergency Room Pain 6 Severely limiting. Requires emergency care and should not be seen or managed at an outpatient pain management facility. Communication becomes difficult and requires great effort. Assistance to reach the emergency department may be required. Facial flushing and profuse sweating along with potentially dangerous increases in heart rate and blood pressure will be evident.   Distressing pain 7 Self-care is very difficult. Assistance is required to transport, or use restroom. Assistance to reach the emergency department will be required. Tasks requiring coordination, such as bathing and getting dressed become very difficult.   Disabling pain 8 Self-care is no longer possible. At this level, pain is disabling. The individual is unable to do even the most "basic" activities such as walking, eating, bathing, dressing, transferring to a bed, or toileting. Fine motor skills are lost. It is difficult to think clearly.   Incapacitating pain 9 Pain becomes incapacitating. Thought processing is no longer possible. Difficult to remember your  own name. Control of movement and coordination are lost.   The worst pain imaginable 10 At this level, most patients pass out from pain. When this level is reached, collapse of the autonomic nervous system occurs, leading to a sudden drop in blood pressure and heart rate. This in turn results in a temporary and dramatic drop in blood flow to the brain, leading to a loss of consciousness. Fainting is one of the body's self defense  mechanisms. Passing out puts the brain in a calmed state and causes it to shut down for a while, in order to begin the healing process.    Summary: 1. Refer to this scale when providing [place] with your pain level. 2. Be accurate and careful when reporting your pain level. This will help with your care. 3. Over-reporting your pain level will lead to loss of credibility. 4. Even a level of 1/10 means that there is pain and will be treated at our facility. 5. High, inaccurate reporting will be documented as "Symptom Exaggeration", leading to loss of credibility and suspicions of possible secondary gains such as obtaining more narcotics, or wanting to appear disabled, for fraudulent reasons. 6. Only pain levels of 5 or below will be seen at our facility. 7. Pain levels of 6 and above will be sent to the Emergency Department and the appointment cancelled. _____________________________________________________________________________________________

## 2016-11-01 NOTE — Patient Instructions (Signed)

## 2016-11-01 NOTE — Progress Notes (Signed)
Safety precautions to be maintained throughout the outpatient stay will include: orient to surroundings, keep bed in low position, maintain call bell within reach at all times, provide assistance with transfer out of bed and ambulation.  

## 2016-11-07 LAB — COMPLIANCE DRUG ANALYSIS, UR

## 2016-11-08 ENCOUNTER — Other Ambulatory Visit: Payer: Self-pay

## 2016-11-16 DIAGNOSIS — M109 Gout, unspecified: Secondary | ICD-10-CM | POA: Diagnosis not present

## 2016-11-16 DIAGNOSIS — F17200 Nicotine dependence, unspecified, uncomplicated: Secondary | ICD-10-CM | POA: Diagnosis not present

## 2016-11-16 DIAGNOSIS — M545 Low back pain: Secondary | ICD-10-CM | POA: Diagnosis not present

## 2016-11-16 DIAGNOSIS — I739 Peripheral vascular disease, unspecified: Secondary | ICD-10-CM | POA: Diagnosis not present

## 2016-11-16 DIAGNOSIS — E785 Hyperlipidemia, unspecified: Secondary | ICD-10-CM | POA: Diagnosis not present

## 2016-11-16 DIAGNOSIS — D649 Anemia, unspecified: Secondary | ICD-10-CM | POA: Diagnosis not present

## 2016-11-16 DIAGNOSIS — J301 Allergic rhinitis due to pollen: Secondary | ICD-10-CM | POA: Diagnosis not present

## 2016-11-16 DIAGNOSIS — I10 Essential (primary) hypertension: Secondary | ICD-10-CM | POA: Diagnosis not present

## 2016-11-16 DIAGNOSIS — N400 Enlarged prostate without lower urinary tract symptoms: Secondary | ICD-10-CM | POA: Diagnosis not present

## 2016-11-16 DIAGNOSIS — G8929 Other chronic pain: Secondary | ICD-10-CM | POA: Diagnosis not present

## 2016-12-14 DIAGNOSIS — F17200 Nicotine dependence, unspecified, uncomplicated: Secondary | ICD-10-CM | POA: Diagnosis not present

## 2016-12-14 DIAGNOSIS — E785 Hyperlipidemia, unspecified: Secondary | ICD-10-CM | POA: Diagnosis not present

## 2016-12-14 DIAGNOSIS — N400 Enlarged prostate without lower urinary tract symptoms: Secondary | ICD-10-CM | POA: Diagnosis not present

## 2016-12-14 DIAGNOSIS — D649 Anemia, unspecified: Secondary | ICD-10-CM | POA: Diagnosis not present

## 2016-12-14 DIAGNOSIS — M545 Low back pain: Secondary | ICD-10-CM | POA: Diagnosis not present

## 2016-12-14 DIAGNOSIS — I10 Essential (primary) hypertension: Secondary | ICD-10-CM | POA: Diagnosis not present

## 2016-12-14 DIAGNOSIS — G8929 Other chronic pain: Secondary | ICD-10-CM | POA: Diagnosis not present

## 2016-12-14 DIAGNOSIS — M109 Gout, unspecified: Secondary | ICD-10-CM | POA: Diagnosis not present

## 2016-12-14 DIAGNOSIS — J301 Allergic rhinitis due to pollen: Secondary | ICD-10-CM | POA: Diagnosis not present

## 2016-12-14 DIAGNOSIS — I739 Peripheral vascular disease, unspecified: Secondary | ICD-10-CM | POA: Diagnosis not present

## 2017-01-13 DIAGNOSIS — D649 Anemia, unspecified: Secondary | ICD-10-CM | POA: Diagnosis not present

## 2017-01-13 DIAGNOSIS — J301 Allergic rhinitis due to pollen: Secondary | ICD-10-CM | POA: Diagnosis not present

## 2017-01-13 DIAGNOSIS — N400 Enlarged prostate without lower urinary tract symptoms: Secondary | ICD-10-CM | POA: Diagnosis not present

## 2017-01-13 DIAGNOSIS — I739 Peripheral vascular disease, unspecified: Secondary | ICD-10-CM | POA: Diagnosis not present

## 2017-01-13 DIAGNOSIS — F17200 Nicotine dependence, unspecified, uncomplicated: Secondary | ICD-10-CM | POA: Diagnosis not present

## 2017-01-13 DIAGNOSIS — M109 Gout, unspecified: Secondary | ICD-10-CM | POA: Diagnosis not present

## 2017-01-13 DIAGNOSIS — G8929 Other chronic pain: Secondary | ICD-10-CM | POA: Diagnosis not present

## 2017-01-13 DIAGNOSIS — M545 Low back pain: Secondary | ICD-10-CM | POA: Diagnosis not present

## 2017-01-13 DIAGNOSIS — I10 Essential (primary) hypertension: Secondary | ICD-10-CM | POA: Diagnosis not present

## 2017-01-13 DIAGNOSIS — E785 Hyperlipidemia, unspecified: Secondary | ICD-10-CM | POA: Diagnosis not present

## 2017-02-10 DIAGNOSIS — M545 Low back pain: Secondary | ICD-10-CM | POA: Diagnosis not present

## 2017-02-10 DIAGNOSIS — I10 Essential (primary) hypertension: Secondary | ICD-10-CM | POA: Diagnosis not present

## 2017-02-10 DIAGNOSIS — N400 Enlarged prostate without lower urinary tract symptoms: Secondary | ICD-10-CM | POA: Diagnosis not present

## 2017-02-10 DIAGNOSIS — J301 Allergic rhinitis due to pollen: Secondary | ICD-10-CM | POA: Diagnosis not present

## 2017-02-10 DIAGNOSIS — M109 Gout, unspecified: Secondary | ICD-10-CM | POA: Diagnosis not present

## 2017-02-10 DIAGNOSIS — D649 Anemia, unspecified: Secondary | ICD-10-CM | POA: Diagnosis not present

## 2017-02-10 DIAGNOSIS — G8929 Other chronic pain: Secondary | ICD-10-CM | POA: Diagnosis not present

## 2017-02-10 DIAGNOSIS — I739 Peripheral vascular disease, unspecified: Secondary | ICD-10-CM | POA: Diagnosis not present

## 2017-02-10 DIAGNOSIS — F17200 Nicotine dependence, unspecified, uncomplicated: Secondary | ICD-10-CM | POA: Diagnosis not present

## 2017-02-10 DIAGNOSIS — E785 Hyperlipidemia, unspecified: Secondary | ICD-10-CM | POA: Diagnosis not present

## 2017-04-04 DIAGNOSIS — M109 Gout, unspecified: Secondary | ICD-10-CM | POA: Diagnosis not present

## 2017-04-04 DIAGNOSIS — G8929 Other chronic pain: Secondary | ICD-10-CM | POA: Diagnosis not present

## 2017-04-04 DIAGNOSIS — E785 Hyperlipidemia, unspecified: Secondary | ICD-10-CM | POA: Diagnosis not present

## 2017-04-04 DIAGNOSIS — J301 Allergic rhinitis due to pollen: Secondary | ICD-10-CM | POA: Diagnosis not present

## 2017-04-04 DIAGNOSIS — N400 Enlarged prostate without lower urinary tract symptoms: Secondary | ICD-10-CM | POA: Diagnosis not present

## 2017-04-04 DIAGNOSIS — I10 Essential (primary) hypertension: Secondary | ICD-10-CM | POA: Diagnosis not present

## 2017-04-04 DIAGNOSIS — D649 Anemia, unspecified: Secondary | ICD-10-CM | POA: Diagnosis not present

## 2017-04-04 DIAGNOSIS — M545 Low back pain: Secondary | ICD-10-CM | POA: Diagnosis not present

## 2017-04-04 DIAGNOSIS — I739 Peripheral vascular disease, unspecified: Secondary | ICD-10-CM | POA: Diagnosis not present

## 2017-04-04 DIAGNOSIS — F17200 Nicotine dependence, unspecified, uncomplicated: Secondary | ICD-10-CM | POA: Diagnosis not present

## 2017-05-05 DIAGNOSIS — D649 Anemia, unspecified: Secondary | ICD-10-CM | POA: Diagnosis not present

## 2017-05-05 DIAGNOSIS — I739 Peripheral vascular disease, unspecified: Secondary | ICD-10-CM | POA: Diagnosis not present

## 2017-05-05 DIAGNOSIS — M545 Low back pain: Secondary | ICD-10-CM | POA: Diagnosis not present

## 2017-05-05 DIAGNOSIS — F17200 Nicotine dependence, unspecified, uncomplicated: Secondary | ICD-10-CM | POA: Diagnosis not present

## 2017-05-05 DIAGNOSIS — G8929 Other chronic pain: Secondary | ICD-10-CM | POA: Diagnosis not present

## 2017-05-05 DIAGNOSIS — J301 Allergic rhinitis due to pollen: Secondary | ICD-10-CM | POA: Diagnosis not present

## 2017-05-05 DIAGNOSIS — I10 Essential (primary) hypertension: Secondary | ICD-10-CM | POA: Diagnosis not present

## 2017-05-05 DIAGNOSIS — F17210 Nicotine dependence, cigarettes, uncomplicated: Secondary | ICD-10-CM | POA: Diagnosis not present

## 2017-05-05 DIAGNOSIS — N400 Enlarged prostate without lower urinary tract symptoms: Secondary | ICD-10-CM | POA: Diagnosis not present

## 2017-05-05 DIAGNOSIS — M109 Gout, unspecified: Secondary | ICD-10-CM | POA: Diagnosis not present

## 2017-05-05 DIAGNOSIS — E785 Hyperlipidemia, unspecified: Secondary | ICD-10-CM | POA: Diagnosis not present

## 2017-06-30 DIAGNOSIS — F17200 Nicotine dependence, unspecified, uncomplicated: Secondary | ICD-10-CM | POA: Diagnosis not present

## 2017-06-30 DIAGNOSIS — N400 Enlarged prostate without lower urinary tract symptoms: Secondary | ICD-10-CM | POA: Diagnosis not present

## 2017-06-30 DIAGNOSIS — M109 Gout, unspecified: Secondary | ICD-10-CM | POA: Diagnosis not present

## 2017-06-30 DIAGNOSIS — I739 Peripheral vascular disease, unspecified: Secondary | ICD-10-CM | POA: Diagnosis not present

## 2017-06-30 DIAGNOSIS — E785 Hyperlipidemia, unspecified: Secondary | ICD-10-CM | POA: Diagnosis not present

## 2017-06-30 DIAGNOSIS — Z23 Encounter for immunization: Secondary | ICD-10-CM | POA: Diagnosis not present

## 2017-06-30 DIAGNOSIS — D649 Anemia, unspecified: Secondary | ICD-10-CM | POA: Diagnosis not present

## 2017-06-30 DIAGNOSIS — J301 Allergic rhinitis due to pollen: Secondary | ICD-10-CM | POA: Diagnosis not present

## 2017-06-30 DIAGNOSIS — I10 Essential (primary) hypertension: Secondary | ICD-10-CM | POA: Diagnosis not present

## 2017-06-30 DIAGNOSIS — M545 Low back pain: Secondary | ICD-10-CM | POA: Diagnosis not present

## 2017-06-30 DIAGNOSIS — G8929 Other chronic pain: Secondary | ICD-10-CM | POA: Diagnosis not present

## 2017-08-25 DIAGNOSIS — F17200 Nicotine dependence, unspecified, uncomplicated: Secondary | ICD-10-CM | POA: Diagnosis not present

## 2017-08-25 DIAGNOSIS — I739 Peripheral vascular disease, unspecified: Secondary | ICD-10-CM | POA: Diagnosis not present

## 2017-08-25 DIAGNOSIS — M545 Low back pain: Secondary | ICD-10-CM | POA: Diagnosis not present

## 2017-08-25 DIAGNOSIS — N400 Enlarged prostate without lower urinary tract symptoms: Secondary | ICD-10-CM | POA: Diagnosis not present

## 2017-08-25 DIAGNOSIS — G8929 Other chronic pain: Secondary | ICD-10-CM | POA: Diagnosis not present

## 2017-08-25 DIAGNOSIS — M109 Gout, unspecified: Secondary | ICD-10-CM | POA: Diagnosis not present

## 2017-08-25 DIAGNOSIS — D649 Anemia, unspecified: Secondary | ICD-10-CM | POA: Diagnosis not present

## 2017-08-25 DIAGNOSIS — J301 Allergic rhinitis due to pollen: Secondary | ICD-10-CM | POA: Diagnosis not present

## 2017-08-25 DIAGNOSIS — I10 Essential (primary) hypertension: Secondary | ICD-10-CM | POA: Diagnosis not present

## 2017-08-25 DIAGNOSIS — E785 Hyperlipidemia, unspecified: Secondary | ICD-10-CM | POA: Diagnosis not present

## 2017-10-20 DIAGNOSIS — J301 Allergic rhinitis due to pollen: Secondary | ICD-10-CM | POA: Diagnosis not present

## 2017-10-20 DIAGNOSIS — F17200 Nicotine dependence, unspecified, uncomplicated: Secondary | ICD-10-CM | POA: Diagnosis not present

## 2017-10-20 DIAGNOSIS — I739 Peripheral vascular disease, unspecified: Secondary | ICD-10-CM | POA: Diagnosis not present

## 2017-10-20 DIAGNOSIS — M109 Gout, unspecified: Secondary | ICD-10-CM | POA: Diagnosis not present

## 2017-10-20 DIAGNOSIS — G8929 Other chronic pain: Secondary | ICD-10-CM | POA: Diagnosis not present

## 2017-10-20 DIAGNOSIS — D649 Anemia, unspecified: Secondary | ICD-10-CM | POA: Diagnosis not present

## 2017-10-20 DIAGNOSIS — E785 Hyperlipidemia, unspecified: Secondary | ICD-10-CM | POA: Diagnosis not present

## 2017-10-20 DIAGNOSIS — N400 Enlarged prostate without lower urinary tract symptoms: Secondary | ICD-10-CM | POA: Diagnosis not present

## 2017-10-20 DIAGNOSIS — M545 Low back pain: Secondary | ICD-10-CM | POA: Diagnosis not present

## 2017-10-20 DIAGNOSIS — I10 Essential (primary) hypertension: Secondary | ICD-10-CM | POA: Diagnosis not present

## 2017-11-17 DIAGNOSIS — Z720 Tobacco use: Secondary | ICD-10-CM | POA: Diagnosis not present

## 2017-11-17 DIAGNOSIS — I4891 Unspecified atrial fibrillation: Secondary | ICD-10-CM | POA: Diagnosis not present

## 2017-11-17 DIAGNOSIS — M109 Gout, unspecified: Secondary | ICD-10-CM | POA: Diagnosis not present

## 2017-11-17 DIAGNOSIS — I517 Cardiomegaly: Secondary | ICD-10-CM | POA: Diagnosis not present

## 2017-11-17 DIAGNOSIS — I2510 Atherosclerotic heart disease of native coronary artery without angina pectoris: Secondary | ICD-10-CM | POA: Diagnosis not present

## 2017-11-17 DIAGNOSIS — I4892 Unspecified atrial flutter: Secondary | ICD-10-CM | POA: Diagnosis not present

## 2017-11-17 DIAGNOSIS — J811 Chronic pulmonary edema: Secondary | ICD-10-CM | POA: Diagnosis not present

## 2017-11-17 DIAGNOSIS — I739 Peripheral vascular disease, unspecified: Secondary | ICD-10-CM | POA: Insufficient documentation

## 2017-11-17 DIAGNOSIS — G8929 Other chronic pain: Secondary | ICD-10-CM | POA: Diagnosis not present

## 2017-11-17 DIAGNOSIS — I21A1 Myocardial infarction type 2: Secondary | ICD-10-CM | POA: Diagnosis not present

## 2017-11-17 DIAGNOSIS — R079 Chest pain, unspecified: Secondary | ICD-10-CM | POA: Diagnosis not present

## 2017-11-17 DIAGNOSIS — I110 Hypertensive heart disease with heart failure: Secondary | ICD-10-CM | POA: Diagnosis not present

## 2017-11-17 DIAGNOSIS — F17210 Nicotine dependence, cigarettes, uncomplicated: Secondary | ICD-10-CM | POA: Diagnosis not present

## 2017-11-17 DIAGNOSIS — M545 Low back pain: Secondary | ICD-10-CM | POA: Diagnosis not present

## 2017-11-17 DIAGNOSIS — I10 Essential (primary) hypertension: Secondary | ICD-10-CM | POA: Diagnosis not present

## 2017-11-17 DIAGNOSIS — R0602 Shortness of breath: Secondary | ICD-10-CM | POA: Diagnosis not present

## 2017-11-17 DIAGNOSIS — I341 Nonrheumatic mitral (valve) prolapse: Secondary | ICD-10-CM | POA: Diagnosis not present

## 2017-11-17 DIAGNOSIS — I771 Stricture of artery: Secondary | ICD-10-CM | POA: Diagnosis not present

## 2017-11-17 DIAGNOSIS — I214 Non-ST elevation (NSTEMI) myocardial infarction: Secondary | ICD-10-CM | POA: Diagnosis not present

## 2017-11-17 DIAGNOSIS — I509 Heart failure, unspecified: Secondary | ICD-10-CM | POA: Diagnosis not present

## 2017-11-17 DIAGNOSIS — I5023 Acute on chronic systolic (congestive) heart failure: Secondary | ICD-10-CM | POA: Diagnosis not present

## 2017-11-17 DIAGNOSIS — I351 Nonrheumatic aortic (valve) insufficiency: Secondary | ICD-10-CM | POA: Diagnosis not present

## 2017-11-17 DIAGNOSIS — I97610 Postprocedural hemorrhage and hematoma of a circulatory system organ or structure following a cardiac catheterization: Secondary | ICD-10-CM | POA: Diagnosis not present

## 2017-11-17 DIAGNOSIS — I255 Ischemic cardiomyopathy: Secondary | ICD-10-CM | POA: Diagnosis not present

## 2017-11-17 DIAGNOSIS — Z981 Arthrodesis status: Secondary | ICD-10-CM | POA: Diagnosis not present

## 2017-11-17 DIAGNOSIS — I700 Atherosclerosis of aorta: Secondary | ICD-10-CM | POA: Diagnosis not present

## 2017-11-27 ENCOUNTER — Other Ambulatory Visit: Payer: Self-pay

## 2017-11-27 NOTE — Patient Outreach (Signed)
[facility]) Care Management  11/27/2017  Fain R Lokken [birth year] [id number]     Transition of Care Referral  Referral Date: 11/27/17 Referral Source: HTA Discharge Report Date of Admission: unknown Diagnosis: unknown Date of Discharge: 11/23/17 Facility: [facility] [place] attempt # 1 to patient. Spoke with patient. Patient states that things are going well since his return home. He reports that he does have MD follow up appts in place and transportation to appts. RN CM confirmed that patient has all his meds and no issues concerning them. Patient did not wish to completeTOC assessment. He voiced he was appreciative of call but declined needing services.     Plan: RN CM will close case at this time.    Enzo Montgomery, RN,BSN,CCM [place] [facility] Telephonic Care Management Coordinator Direct Phone: [phone] Toll Free: [phone] Fax: [phone]

## 2017-11-28 DIAGNOSIS — E785 Hyperlipidemia, unspecified: Secondary | ICD-10-CM | POA: Diagnosis not present

## 2017-11-28 DIAGNOSIS — M109 Gout, unspecified: Secondary | ICD-10-CM | POA: Diagnosis not present

## 2017-11-28 DIAGNOSIS — I2510 Atherosclerotic heart disease of native coronary artery without angina pectoris: Secondary | ICD-10-CM | POA: Diagnosis not present

## 2017-11-28 DIAGNOSIS — D649 Anemia, unspecified: Secondary | ICD-10-CM | POA: Diagnosis not present

## 2017-11-28 DIAGNOSIS — G8929 Other chronic pain: Secondary | ICD-10-CM | POA: Diagnosis not present

## 2017-11-28 DIAGNOSIS — I739 Peripheral vascular disease, unspecified: Secondary | ICD-10-CM | POA: Diagnosis not present

## 2017-11-28 DIAGNOSIS — J301 Allergic rhinitis due to pollen: Secondary | ICD-10-CM | POA: Diagnosis not present

## 2017-11-28 DIAGNOSIS — F17200 Nicotine dependence, unspecified, uncomplicated: Secondary | ICD-10-CM | POA: Diagnosis not present

## 2017-11-28 DIAGNOSIS — I5020 Unspecified systolic (congestive) heart failure: Secondary | ICD-10-CM | POA: Diagnosis not present

## 2017-11-28 DIAGNOSIS — I10 Essential (primary) hypertension: Secondary | ICD-10-CM | POA: Diagnosis not present

## 2017-11-28 DIAGNOSIS — N400 Enlarged prostate without lower urinary tract symptoms: Secondary | ICD-10-CM | POA: Diagnosis not present

## 2017-11-28 DIAGNOSIS — M545 Low back pain: Secondary | ICD-10-CM | POA: Diagnosis not present

## 2017-12-06 DIAGNOSIS — F17210 Nicotine dependence, cigarettes, uncomplicated: Secondary | ICD-10-CM | POA: Diagnosis not present

## 2017-12-06 DIAGNOSIS — J811 Chronic pulmonary edema: Secondary | ICD-10-CM | POA: Diagnosis not present

## 2017-12-06 DIAGNOSIS — I4892 Unspecified atrial flutter: Secondary | ICD-10-CM | POA: Diagnosis not present

## 2017-12-06 DIAGNOSIS — M109 Gout, unspecified: Secondary | ICD-10-CM | POA: Diagnosis not present

## 2017-12-06 DIAGNOSIS — Z7982 Long term (current) use of aspirin: Secondary | ICD-10-CM | POA: Diagnosis not present

## 2017-12-06 DIAGNOSIS — R7989 Other specified abnormal findings of blood chemistry: Secondary | ICD-10-CM | POA: Diagnosis not present

## 2017-12-06 DIAGNOSIS — I700 Atherosclerosis of aorta: Secondary | ICD-10-CM | POA: Diagnosis not present

## 2017-12-06 DIAGNOSIS — I10 Essential (primary) hypertension: Secondary | ICD-10-CM | POA: Diagnosis not present

## 2017-12-06 DIAGNOSIS — Z79899 Other long term (current) drug therapy: Secondary | ICD-10-CM | POA: Diagnosis not present

## 2017-12-06 DIAGNOSIS — I2510 Atherosclerotic heart disease of native coronary artery without angina pectoris: Secondary | ICD-10-CM | POA: Diagnosis not present

## 2017-12-06 DIAGNOSIS — Z7901 Long term (current) use of anticoagulants: Secondary | ICD-10-CM | POA: Diagnosis not present

## 2017-12-06 DIAGNOSIS — R0602 Shortness of breath: Secondary | ICD-10-CM | POA: Diagnosis not present

## 2017-12-06 DIAGNOSIS — I255 Ischemic cardiomyopathy: Secondary | ICD-10-CM | POA: Diagnosis not present

## 2017-12-06 DIAGNOSIS — I739 Peripheral vascular disease, unspecified: Secondary | ICD-10-CM | POA: Diagnosis not present

## 2017-12-06 DIAGNOSIS — I083 Combined rheumatic disorders of mitral, aortic and tricuspid valves: Secondary | ICD-10-CM | POA: Diagnosis not present

## 2017-12-12 DIAGNOSIS — E43 Unspecified severe protein-calorie malnutrition: Secondary | ICD-10-CM | POA: Diagnosis not present

## 2017-12-12 DIAGNOSIS — I517 Cardiomegaly: Secondary | ICD-10-CM | POA: Diagnosis not present

## 2017-12-12 DIAGNOSIS — G459 Transient cerebral ischemic attack, unspecified: Secondary | ICD-10-CM | POA: Diagnosis not present

## 2017-12-12 DIAGNOSIS — R739 Hyperglycemia, unspecified: Secondary | ICD-10-CM | POA: Diagnosis not present

## 2017-12-12 DIAGNOSIS — I4892 Unspecified atrial flutter: Secondary | ICD-10-CM | POA: Diagnosis not present

## 2017-12-12 DIAGNOSIS — I340 Nonrheumatic mitral (valve) insufficiency: Secondary | ICD-10-CM | POA: Diagnosis not present

## 2017-12-12 DIAGNOSIS — I2510 Atherosclerotic heart disease of native coronary artery without angina pectoris: Secondary | ICD-10-CM | POA: Diagnosis not present

## 2017-12-12 DIAGNOSIS — I5020 Unspecified systolic (congestive) heart failure: Secondary | ICD-10-CM | POA: Diagnosis not present

## 2017-12-12 DIAGNOSIS — G8918 Other acute postprocedural pain: Secondary | ICD-10-CM | POA: Diagnosis not present

## 2017-12-12 DIAGNOSIS — Z452 Encounter for adjustment and management of vascular access device: Secondary | ICD-10-CM | POA: Diagnosis not present

## 2017-12-12 DIAGNOSIS — Z7409 Other reduced mobility: Secondary | ICD-10-CM | POA: Diagnosis not present

## 2017-12-12 DIAGNOSIS — I25110 Atherosclerotic heart disease of native coronary artery with unstable angina pectoris: Secondary | ICD-10-CM | POA: Diagnosis not present

## 2017-12-12 DIAGNOSIS — M1000 Idiopathic gout, unspecified site: Secondary | ICD-10-CM | POA: Diagnosis not present

## 2017-12-12 DIAGNOSIS — R0989 Other specified symptoms and signs involving the circulatory and respiratory systems: Secondary | ICD-10-CM | POA: Diagnosis not present

## 2017-12-12 DIAGNOSIS — F17210 Nicotine dependence, cigarettes, uncomplicated: Secondary | ICD-10-CM | POA: Diagnosis not present

## 2017-12-12 DIAGNOSIS — G8191 Hemiplegia, unspecified affecting right dominant side: Secondary | ICD-10-CM | POA: Diagnosis not present

## 2017-12-12 DIAGNOSIS — I5189 Other ill-defined heart diseases: Secondary | ICD-10-CM | POA: Diagnosis not present

## 2017-12-12 DIAGNOSIS — E7849 Other hyperlipidemia: Secondary | ICD-10-CM | POA: Diagnosis not present

## 2017-12-12 DIAGNOSIS — I700 Atherosclerosis of aorta: Secondary | ICD-10-CM | POA: Diagnosis not present

## 2017-12-12 DIAGNOSIS — Z4682 Encounter for fitting and adjustment of non-vascular catheter: Secondary | ICD-10-CM | POA: Diagnosis not present

## 2017-12-12 DIAGNOSIS — I4891 Unspecified atrial fibrillation: Secondary | ICD-10-CM | POA: Diagnosis not present

## 2017-12-12 DIAGNOSIS — J9811 Atelectasis: Secondary | ICD-10-CM | POA: Diagnosis not present

## 2017-12-12 DIAGNOSIS — M6281 Muscle weakness (generalized): Secondary | ICD-10-CM | POA: Diagnosis not present

## 2017-12-12 DIAGNOSIS — G8929 Other chronic pain: Secondary | ICD-10-CM | POA: Diagnosis not present

## 2017-12-12 DIAGNOSIS — I639 Cerebral infarction, unspecified: Secondary | ICD-10-CM | POA: Diagnosis not present

## 2017-12-12 DIAGNOSIS — J811 Chronic pulmonary edema: Secondary | ICD-10-CM | POA: Diagnosis not present

## 2017-12-12 DIAGNOSIS — J984 Other disorders of lung: Secondary | ICD-10-CM | POA: Diagnosis not present

## 2017-12-12 DIAGNOSIS — I10 Essential (primary) hypertension: Secondary | ICD-10-CM | POA: Diagnosis not present

## 2017-12-12 DIAGNOSIS — R2681 Unsteadiness on feet: Secondary | ICD-10-CM | POA: Diagnosis not present

## 2017-12-12 DIAGNOSIS — J951 Acute pulmonary insufficiency following thoracic surgery: Secondary | ICD-10-CM | POA: Diagnosis not present

## 2017-12-12 DIAGNOSIS — R4781 Slurred speech: Secondary | ICD-10-CM | POA: Diagnosis not present

## 2017-12-12 DIAGNOSIS — I959 Hypotension, unspecified: Secondary | ICD-10-CM | POA: Diagnosis not present

## 2017-12-12 DIAGNOSIS — R918 Other nonspecific abnormal finding of lung field: Secondary | ICD-10-CM | POA: Diagnosis not present

## 2017-12-12 DIAGNOSIS — I739 Peripheral vascular disease, unspecified: Secondary | ICD-10-CM | POA: Diagnosis not present

## 2017-12-12 DIAGNOSIS — I97820 Postprocedural cerebrovascular infarction during cardiac surgery: Secondary | ICD-10-CM | POA: Diagnosis not present

## 2017-12-12 DIAGNOSIS — E569 Vitamin deficiency, unspecified: Secondary | ICD-10-CM | POA: Diagnosis not present

## 2017-12-12 DIAGNOSIS — G8190 Hemiplegia, unspecified affecting unspecified side: Secondary | ICD-10-CM | POA: Diagnosis not present

## 2017-12-12 DIAGNOSIS — I214 Non-ST elevation (NSTEMI) myocardial infarction: Secondary | ICD-10-CM | POA: Diagnosis not present

## 2017-12-12 DIAGNOSIS — D62 Acute posthemorrhagic anemia: Secondary | ICD-10-CM | POA: Diagnosis not present

## 2017-12-12 DIAGNOSIS — H53451 Other localized visual field defect, right eye: Secondary | ICD-10-CM | POA: Diagnosis not present

## 2017-12-12 DIAGNOSIS — R29810 Facial weakness: Secondary | ICD-10-CM | POA: Diagnosis not present

## 2017-12-12 DIAGNOSIS — D72829 Elevated white blood cell count, unspecified: Secondary | ICD-10-CM | POA: Diagnosis not present

## 2017-12-12 DIAGNOSIS — J90 Pleural effusion, not elsewhere classified: Secondary | ICD-10-CM | POA: Diagnosis not present

## 2017-12-12 DIAGNOSIS — I110 Hypertensive heart disease with heart failure: Secondary | ICD-10-CM | POA: Diagnosis not present

## 2017-12-12 DIAGNOSIS — E612 Magnesium deficiency: Secondary | ICD-10-CM | POA: Diagnosis not present

## 2017-12-12 DIAGNOSIS — Z951 Presence of aortocoronary bypass graft: Secondary | ICD-10-CM | POA: Diagnosis not present

## 2017-12-12 DIAGNOSIS — I255 Ischemic cardiomyopathy: Secondary | ICD-10-CM | POA: Diagnosis not present

## 2017-12-12 DIAGNOSIS — R29704 NIHSS score 4: Secondary | ICD-10-CM | POA: Diagnosis not present

## 2017-12-12 DIAGNOSIS — D696 Thrombocytopenia, unspecified: Secondary | ICD-10-CM | POA: Diagnosis not present

## 2017-12-12 DIAGNOSIS — I348 Other nonrheumatic mitral valve disorders: Secondary | ICD-10-CM | POA: Diagnosis not present

## 2017-12-12 DIAGNOSIS — Z9911 Dependence on respirator [ventilator] status: Secondary | ICD-10-CM | POA: Diagnosis not present

## 2017-12-12 DIAGNOSIS — Z7982 Long term (current) use of aspirin: Secondary | ICD-10-CM | POA: Diagnosis not present

## 2017-12-12 DIAGNOSIS — I5023 Acute on chronic systolic (congestive) heart failure: Secondary | ICD-10-CM | POA: Diagnosis not present

## 2017-12-12 DIAGNOSIS — E872 Acidosis: Secondary | ICD-10-CM | POA: Diagnosis not present

## 2017-12-15 DIAGNOSIS — I5023 Acute on chronic systolic (congestive) heart failure: Secondary | ICD-10-CM | POA: Insufficient documentation

## 2017-12-15 DIAGNOSIS — I639 Cerebral infarction, unspecified: Secondary | ICD-10-CM | POA: Insufficient documentation

## 2017-12-27 DIAGNOSIS — G459 Transient cerebral ischemic attack, unspecified: Secondary | ICD-10-CM | POA: Diagnosis not present

## 2017-12-27 DIAGNOSIS — M109 Gout, unspecified: Secondary | ICD-10-CM | POA: Diagnosis not present

## 2017-12-27 DIAGNOSIS — E612 Magnesium deficiency: Secondary | ICD-10-CM | POA: Diagnosis not present

## 2017-12-27 DIAGNOSIS — I6350 Cerebral infarction due to unspecified occlusion or stenosis of unspecified cerebral artery: Secondary | ICD-10-CM | POA: Diagnosis not present

## 2017-12-27 DIAGNOSIS — M6281 Muscle weakness (generalized): Secondary | ICD-10-CM | POA: Diagnosis not present

## 2017-12-27 DIAGNOSIS — I509 Heart failure, unspecified: Secondary | ICD-10-CM | POA: Diagnosis not present

## 2017-12-27 DIAGNOSIS — R2681 Unsteadiness on feet: Secondary | ICD-10-CM | POA: Diagnosis not present

## 2017-12-27 DIAGNOSIS — I2510 Atherosclerotic heart disease of native coronary artery without angina pectoris: Secondary | ICD-10-CM | POA: Diagnosis not present

## 2017-12-27 DIAGNOSIS — E569 Vitamin deficiency, unspecified: Secondary | ICD-10-CM | POA: Diagnosis not present

## 2017-12-27 DIAGNOSIS — I5023 Acute on chronic systolic (congestive) heart failure: Secondary | ICD-10-CM | POA: Diagnosis not present

## 2017-12-27 DIAGNOSIS — E785 Hyperlipidemia, unspecified: Secondary | ICD-10-CM | POA: Diagnosis not present

## 2017-12-27 DIAGNOSIS — E7849 Other hyperlipidemia: Secondary | ICD-10-CM | POA: Diagnosis not present

## 2017-12-27 DIAGNOSIS — I4891 Unspecified atrial fibrillation: Secondary | ICD-10-CM | POA: Diagnosis not present

## 2017-12-27 DIAGNOSIS — I10 Essential (primary) hypertension: Secondary | ICD-10-CM | POA: Diagnosis not present

## 2017-12-27 DIAGNOSIS — Z951 Presence of aortocoronary bypass graft: Secondary | ICD-10-CM | POA: Diagnosis not present

## 2017-12-27 DIAGNOSIS — E729 Disorder of amino-acid metabolism, unspecified: Secondary | ICD-10-CM | POA: Diagnosis not present

## 2017-12-27 DIAGNOSIS — M1000 Idiopathic gout, unspecified site: Secondary | ICD-10-CM | POA: Diagnosis not present

## 2017-12-28 DIAGNOSIS — I6350 Cerebral infarction due to unspecified occlusion or stenosis of unspecified cerebral artery: Secondary | ICD-10-CM | POA: Diagnosis not present

## 2017-12-28 DIAGNOSIS — E785 Hyperlipidemia, unspecified: Secondary | ICD-10-CM | POA: Diagnosis not present

## 2017-12-28 DIAGNOSIS — Z951 Presence of aortocoronary bypass graft: Secondary | ICD-10-CM | POA: Diagnosis not present

## 2017-12-28 DIAGNOSIS — M109 Gout, unspecified: Secondary | ICD-10-CM | POA: Diagnosis not present

## 2017-12-28 DIAGNOSIS — E729 Disorder of amino-acid metabolism, unspecified: Secondary | ICD-10-CM | POA: Diagnosis not present

## 2017-12-28 DIAGNOSIS — I4891 Unspecified atrial fibrillation: Secondary | ICD-10-CM | POA: Diagnosis not present

## 2017-12-28 DIAGNOSIS — I2510 Atherosclerotic heart disease of native coronary artery without angina pectoris: Secondary | ICD-10-CM | POA: Diagnosis not present

## 2017-12-28 DIAGNOSIS — I509 Heart failure, unspecified: Secondary | ICD-10-CM | POA: Diagnosis not present

## 2017-12-28 DIAGNOSIS — I10 Essential (primary) hypertension: Secondary | ICD-10-CM | POA: Diagnosis not present

## 2017-12-29 DIAGNOSIS — I4891 Unspecified atrial fibrillation: Secondary | ICD-10-CM | POA: Diagnosis not present

## 2017-12-29 DIAGNOSIS — I5023 Acute on chronic systolic (congestive) heart failure: Secondary | ICD-10-CM | POA: Diagnosis not present

## 2018-01-02 DIAGNOSIS — Z48812 Encounter for surgical aftercare following surgery on the circulatory system: Secondary | ICD-10-CM | POA: Diagnosis not present

## 2018-01-02 DIAGNOSIS — F17210 Nicotine dependence, cigarettes, uncomplicated: Secondary | ICD-10-CM | POA: Diagnosis not present

## 2018-01-02 DIAGNOSIS — I2510 Atherosclerotic heart disease of native coronary artery without angina pectoris: Secondary | ICD-10-CM | POA: Diagnosis not present

## 2018-01-02 DIAGNOSIS — I5022 Chronic systolic (congestive) heart failure: Secondary | ICD-10-CM | POA: Diagnosis not present

## 2018-01-02 DIAGNOSIS — G8929 Other chronic pain: Secondary | ICD-10-CM | POA: Diagnosis not present

## 2018-01-02 DIAGNOSIS — M549 Dorsalgia, unspecified: Secondary | ICD-10-CM | POA: Diagnosis not present

## 2018-01-02 DIAGNOSIS — I69354 Hemiplegia and hemiparesis following cerebral infarction affecting left non-dominant side: Secondary | ICD-10-CM | POA: Diagnosis not present

## 2018-01-02 DIAGNOSIS — E569 Vitamin deficiency, unspecified: Secondary | ICD-10-CM | POA: Diagnosis not present

## 2018-01-02 DIAGNOSIS — I4891 Unspecified atrial fibrillation: Secondary | ICD-10-CM | POA: Diagnosis not present

## 2018-01-02 DIAGNOSIS — I110 Hypertensive heart disease with heart failure: Secondary | ICD-10-CM | POA: Diagnosis not present

## 2018-01-02 DIAGNOSIS — I739 Peripheral vascular disease, unspecified: Secondary | ICD-10-CM | POA: Diagnosis not present

## 2018-01-02 DIAGNOSIS — M1000 Idiopathic gout, unspecified site: Secondary | ICD-10-CM | POA: Diagnosis not present

## 2018-01-02 DIAGNOSIS — Z9181 History of falling: Secondary | ICD-10-CM | POA: Diagnosis not present

## 2018-01-04 ENCOUNTER — Other Ambulatory Visit: Payer: Self-pay

## 2018-01-04 NOTE — Patient Outreach (Signed)
[facility]) Care Management  01/04/2018  Duane Owens [birth year] [id number]   Referral Date: 01/04/18 Referral Source: HTA report Date of Admission:  Diagnosis: A. Flutter Date of Discharge: 01/01/18 Facility: [facility] Insurance: HTA  Outreach attempt # 1 Telephone call to patient for transition of care follow up.  Patient states he really does not feel like talking and asked for a call back tomorrow.  Advised CM would call tomorrow.     Plan: RN CM will send letter and attempt patient again tomorrow.   [name], RN, MSN [facility] Care Management Care Management Coordinator Direct Line [phone] Toll Free: [phone]  Fax: [phone]

## 2018-01-05 ENCOUNTER — Other Ambulatory Visit: Payer: Self-pay

## 2018-01-05 NOTE — Patient Outreach (Signed)
[name]) Care Management  01/05/2018  Duane Owens 01-20-1941 [id number]    Referral Date: 01/04/18 Referral Source: HTA report Date of Admission:  Diagnosis: A. Flutter Date of Discharge: 01/01/18 Facility: [facility] Insurance: HTA  Telephone call to patient for transition of care follow up.  No answer. Unable to leave a message.  Plan: RN CM will attempt patient again within 4 business days.    [name], RN, MSN [facility] Care Management Care Management Coordinator Direct Line [phone] Toll Free: [phone]  Fax: [phone]

## 2018-01-08 ENCOUNTER — Other Ambulatory Visit: Payer: Self-pay

## 2018-01-08 NOTE — Patient Outreach (Signed)
[facility]) Care Management  01/08/2018  Duane Owens February 06, 1941 [id number]   Referral Date:01/04/18 Referral Source:HTA report Date of Admission: 12/27/17 Diagnosis:Weakness Date of Discharge:01/01/18 Facility:[facility] Insurance:HTA   Outreach attempt # 3 Spoke with patient. He is able to verify HIPAA.  Advised patient on reason for call.  Patient states that he is doing ok. Patient reports that  he does not have therapy in the home as he does not need any.  Patient reports that he is able to bath and dress himself.  Patient reports he has an appointment this week with his primary care physician and that he comes to him.  Patient declined to review medications but states he has his medications and is taking as prescribed. Patient declined to answer further questions and declines need for services. .     Plan: RN CM will close case at this time.  [name], RN, MSN [facility] Care Management Care Management Coordinator Direct Line [phone] Toll Free: [phone]  Fax: [phone]

## 2018-01-10 DIAGNOSIS — Z951 Presence of aortocoronary bypass graft: Secondary | ICD-10-CM | POA: Diagnosis not present

## 2018-01-10 DIAGNOSIS — N179 Acute kidney failure, unspecified: Secondary | ICD-10-CM | POA: Diagnosis not present

## 2018-01-10 DIAGNOSIS — Z48812 Encounter for surgical aftercare following surgery on the circulatory system: Secondary | ICD-10-CM | POA: Diagnosis not present

## 2018-01-12 DIAGNOSIS — G8929 Other chronic pain: Secondary | ICD-10-CM | POA: Diagnosis not present

## 2018-01-12 DIAGNOSIS — D649 Anemia, unspecified: Secondary | ICD-10-CM | POA: Diagnosis not present

## 2018-01-12 DIAGNOSIS — E785 Hyperlipidemia, unspecified: Secondary | ICD-10-CM | POA: Diagnosis not present

## 2018-01-12 DIAGNOSIS — N400 Enlarged prostate without lower urinary tract symptoms: Secondary | ICD-10-CM | POA: Diagnosis not present

## 2018-01-12 DIAGNOSIS — I2510 Atherosclerotic heart disease of native coronary artery without angina pectoris: Secondary | ICD-10-CM | POA: Diagnosis not present

## 2018-01-12 DIAGNOSIS — I10 Essential (primary) hypertension: Secondary | ICD-10-CM | POA: Diagnosis not present

## 2018-01-12 DIAGNOSIS — R110 Nausea: Secondary | ICD-10-CM | POA: Diagnosis not present

## 2018-01-12 DIAGNOSIS — E440 Moderate protein-calorie malnutrition: Secondary | ICD-10-CM | POA: Diagnosis not present

## 2018-01-12 DIAGNOSIS — M109 Gout, unspecified: Secondary | ICD-10-CM | POA: Diagnosis not present

## 2018-01-12 DIAGNOSIS — I739 Peripheral vascular disease, unspecified: Secondary | ICD-10-CM | POA: Diagnosis not present

## 2018-01-12 DIAGNOSIS — M545 Low back pain: Secondary | ICD-10-CM | POA: Diagnosis not present

## 2018-01-12 DIAGNOSIS — K5909 Other constipation: Secondary | ICD-10-CM | POA: Diagnosis not present

## 2018-01-23 ENCOUNTER — Encounter: Payer: Self-pay | Admitting: Emergency Medicine

## 2018-01-23 ENCOUNTER — Emergency Department
Admission: EM | Admit: 2018-01-23 | Discharge: 2018-01-23 | Disposition: A | Discharge status: Home / Self Care | Payer: PPO | Attending: Student in an Organized Health Care Education/Training Program | Admitting: Student in an Organized Health Care Education/Training Program

## 2018-01-23 ENCOUNTER — Emergency Department: Payer: PPO

## 2018-01-23 DIAGNOSIS — Y658 Other specified misadventures during surgical and medical care: Secondary | ICD-10-CM | POA: Insufficient documentation

## 2018-01-23 DIAGNOSIS — T50905A Adverse effect of unspecified drugs, medicaments and biological substances, initial encounter: Secondary | ICD-10-CM | POA: Insufficient documentation

## 2018-01-23 DIAGNOSIS — F17210 Nicotine dependence, cigarettes, uncomplicated: Secondary | ICD-10-CM | POA: Insufficient documentation

## 2018-01-23 DIAGNOSIS — Z8546 Personal history of malignant neoplasm of prostate: Secondary | ICD-10-CM | POA: Insufficient documentation

## 2018-01-23 DIAGNOSIS — I10 Essential (primary) hypertension: Secondary | ICD-10-CM | POA: Diagnosis not present

## 2018-01-23 DIAGNOSIS — T887XXA Unspecified adverse effect of drug or medicament, initial encounter: Secondary | ICD-10-CM | POA: Diagnosis not present

## 2018-01-23 DIAGNOSIS — Z79899 Other long term (current) drug therapy: Secondary | ICD-10-CM | POA: Insufficient documentation

## 2018-01-23 DIAGNOSIS — K5903 Drug induced constipation: Secondary | ICD-10-CM | POA: Diagnosis not present

## 2018-01-23 DIAGNOSIS — K5900 Constipation, unspecified: Secondary | ICD-10-CM | POA: Diagnosis not present

## 2018-01-23 MED ORDER — MAGNESIUM CITRATE PO SOLN
1.0000 | Freq: Once | ORAL | Status: AC
  Administered 2018-01-23: 1 via ORAL
  Filled 2018-01-23: qty 296

## 2018-01-23 MED ORDER — GLYCERIN (LAXATIVE) 2.1 G RE SUPP
1.0000 | Freq: Once | RECTAL | Status: AC
Start: 2018-01-23 — End: 2018-01-23
  Administered 2018-01-23: 1 via RECTAL
  Filled 2018-01-23 (×2): qty 1

## 2018-01-23 MED ORDER — LACTULOSE 10 GM/15ML PO SOLN: 20.0000 g | Freq: Three times a day (TID) | ORAL | 3 refills | Status: DC | PRN

## 2018-01-23 NOTE — Discharge Instructions (Addendum)
Has a home remedy you can use prune juice roughly 8 ounces mixed with a tablespoon of mineral oil to help loosen stools.  Return to the ER if you start developing watery diarrhea worsening abdominal pain, fevers or for any additional questions or concerns.  You have been seen in the emergency department for emergency care. It is important that you contact your own doctor, specialist or the closest clinic for follow-up care. Please bring this instruction sheet, all medications and X-ray copies with you when you are seen for follow-up care.  Determining the exact cause for all patients with abdominal pain is extremely difficult in the emergency department. Our primary focus is to rule-out immediate life-threatening diseases. If no immediate source of pain is found the definitive diagnosis frequently needs to be determined over time.Many times your primary care physician can determine the cause by following the symptoms over time. Sometimes, specialist are required such as Gastroenterologists, Gynecologists, Urologists or Surgeons. Please return immediately to the Emergency Department for fever>101, Vomiting or Intractable Pain. You should return to the emergency department or see your primary care provider in 12-24hrs if your pain is no better and sooner if your pain becomes worse.

## 2018-01-23 NOTE — ED Notes (Signed)
AAOx3.  Skin warm and dry.  NAD 

## 2018-01-23 NOTE — ED Provider Notes (Signed)
[facility] Emergency Department Provider Note    First MD Initiated Contact with Patient 01/23/18 1404     (approximate)  I have reviewed the triage vital signs and the nursing notes.   HISTORY  Chief Complaint Constipation    HPI Duane Owens is a 77 y.o. male test was recent cardiac bypass surgery 2 weeks ago presents to the ER with having difficulty moving his bowels and not passing a stool for 4 days.  States he has been trying Dulcolax and was given prescription for MiraLAX but "that does not work for me "family member at bedside says that he typically expect things to immediately work and is not been taking them as prescribed.  Denies any chest pain or shortness of breath.  Otherwise is feeling well.  Has been on Percocet status post the surgery.    Past Medical History:  Diagnosis Date  . Anemia   . Atherosclerosis   . Cancer [facility])    prostate  . Hypercholesteremia   . Hyperlipemia   . Hypertension   . Spinal stenosis   . Varicose vein   . Venous insufficiency    No family history on file. Past Surgical History:  Procedure Laterality Date  . BACK SURGERY    . CYSTOSCOPY WITH DIRECT VISION INTERNAL URETHROTOMY N/A 02/02/2016   Procedure: CYSTOSCOPY WITH DIRECT VISION INTERNAL URETHROTOMY;  Surgeon: Royston Cowper, MD;  Location: [facility] ORS;  Service: Urology;  Laterality: N/A;  . HOLMIUM LASER APPLICATION N/A 8/78/6767   Procedure: HOLMIUM LASER APPLICATION;  Surgeon: Royston Cowper, MD;  Location: [facility] ORS;  Service: Urology;  Laterality: N/A;  . PERIPHERAL VASCULAR CATHETERIZATION N/A 08/11/2015   Procedure: Abdominal Aortogram w/Lower Extremity;  Surgeon: Katha Cabal, MD;  Location: [place] CV LAB;  Service: Cardiovascular;  Laterality: N/A;  . PERIPHERAL VASCULAR CATHETERIZATION  08/11/2015   Procedure: Lower Extremity Intervention;  Surgeon: Katha Cabal, MD;  Location: [place] CV LAB;  Service:  Cardiovascular;;  . TONSILLECTOMY     Patient Active Problem List   Diagnosis Date Noted  . Chronic low back pain (Location of Primary Source of Pain) (Bilateral) (R>L) 11/01/2016  . Chronic lower extremity pain (Location of Secondary source of pain) (Right) 11/01/2016  . Chronic pain syndrome 11/01/2016  . Long term current use of opiate analgesic 11/01/2016  . Long term prescription opiate use 11/01/2016  . Opiate use (45 MME/Day) 11/01/2016  . Abnormal MRI, lumbar spine (05/28/2014) 11/01/2016  . 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 Lumbar central spinal stenosis (severe at L2-3 and L3-4) 11/01/2016  . Lumbar lateral recess stenosis (Left L3-4; Bilateral L5; Right L4-5) 11/01/2016  . Chronic radicular pain of lower extremity 11/01/2016  . Lumbar facet hypertrophy 11/01/2016  . Lumbar spondylosis 11/01/2016  . Lumbar foraminal stenosis (L4-5) (Bilateral) (R>L) 11/01/2016  . Neurogenic pain 11/01/2016  . Gout 11/01/2016  . Mixed hyperlipidemia 11/01/2016  . Essential hypertension, benign 11/01/2016  . History of hepatitis 11/01/2016  . History of prostate cancer 11/01/2016      Prior to Admission medications   Medication Sig Start Date End Date Taking? Authorizing Provider  allopurinol (ZYLOPRIM) 300 MG tablet Take 300 mg by mouth daily.    [provider]  aspirin 325 MG tablet Take 325 mg by mouth daily.    [provider]  hydrochlorothiazide (HYDRODIURIL) 12.5 MG tablet Take 12.5 mg by mouth daily.    [provider]  lactulose (CHRONULAC) 10 GM/15ML solution Take 30 mLs (20 g total) by mouth 3 (three) times daily as needed for mild constipation or moderate constipation. 01/23/18   Merlyn Lot, MD  oxyCODONE-acetaminophen (PERCOCET) 7.5-325 MG tablet Take by mouth 2 (two) times daily.    [provider]    Allergies Patient has no known allergies.    Social History Social History    Tobacco Use  . Smoking status: Current Every Day Smoker    Packs/day: 0.50    Years: 5.00    Pack years: 2.50    Types: Cigarettes  . Smokeless tobacco: Never Used  Substance Use Topics  . Alcohol use: No  . Drug use: No    Review of Systems Patient denies headaches, rhinorrhea, blurry vision, numbness, shortness of breath, chest pain, edema, cough, abdominal pain, nausea, vomiting, diarrhea, dysuria, fevers, rashes or hallucinations unless otherwise stated above in HPI. ____________________________________________   PHYSICAL EXAM:  VITAL SIGNS: Vitals:   01/23/18 1323  BP: (!) 122/53  Pulse: 66  Resp: 18  Temp: 97.7 F (36.5 C)  SpO2: 97%    Constitutional: Alert and oriented.  Eyes: Conjunctivae are normal.  Head: Atraumatic. Nose: No congestion/rhinnorhea. Mouth/Throat: Mucous membranes are moist.   Neck: No stridor. Painless ROM.  Cardiovascular: Normal rate, regular rhythm. Grossly normal heart sounds.  Good peripheral circulation. Respiratory: Normal respiratory effort.  No retractions. Lungs CTAB. Gastrointestinal: Soft and nontender. No distention. No abdominal bruits. No CVA tenderness. Genitourinary: Normal external genitalia no rectal masses or fecal impaction Musculoskeletal: No lower extremity tenderness nor edema.  No joint effusions. Neurologic:  Normal speech and language. No gross focal neurologic deficits are appreciated. No facial droop Skin:  Skin is warm, dry and intact. No rash noted. Psychiatric: Mood and affect are normal. Speech and behavior are normal.  ____________________________________________   LABS (all labs ordered are listed, but only abnormal results are displayed)  No results found for this or any previous visit (from the past 24 hour(s)). ____________________________________________  EKG_____________________________  RADIOLOGY  I personally reviewed all radiographic images ordered to evaluate for the above acute  complaints and reviewed radiology reports and findings.  These findings were personally discussed with the patient.  Please see medical record for radiology report.  ____________________________________________   PROCEDURES  Procedure(s) performed:  Procedures    Critical Care performed: no ____________________________________________   INITIAL IMPRESSION / ASSESSMENT AND PLAN / ED COURSE  Pertinent labs & imaging results that were available during my care of the patient were reviewed by me and considered in my medical decision making (see chart for details).   DDX: constipation, mass, impaction  Duane Owens is a 77 y.o. who presents to the ED with constipation as described above likely secondary to postop component of ileus as well as drug-induced secondary to narcotic pain medication.  Patient otherwise asymptomatic.  No evidence of obstructive pattern on x-ray and his abdominal exam is soft benign.  Clinical Course as of Jan 24 1504  Tue Jan 23, 2018  1502 Exam with no fecal impaction.  Patient given mag citrate tolerating that well.  Do not feel there is any indication for further diagnostic testing at this point as his presentation is most clinically consistent with constipation likely drug-induced.  Discussed home remedies as well as will be given prescription for MiraLAX as well as additional laxative.  Discussed signs and symptoms for which he should follow-up with PCP.   [PR]    Clinical Course  User Index [PR] Merlyn Lot, MD     As part of my medical decision making, I reviewed the following data within the [place] notes reviewed and incorporated, Labs reviewed, notes from prior ED visits.   ____________________________________________   FINAL CLINICAL IMPRESSION(S) / ED DIAGNOSES  Final diagnoses:  Drug-induced constipation      NEW MEDICATIONS STARTED DURING THIS VISIT:  New Prescriptions   LACTULOSE (CHRONULAC) 10  GM/15ML SOLUTION    Take 30 mLs (20 g total) by mouth 3 (three) times daily as needed for mild constipation or moderate constipation.     Note:  This document was prepared using Dragon voice recognition software and may include unintentional dictation errors.    Merlyn Lot, MD 01/23/18 1505

## 2018-01-23 NOTE — ED Notes (Signed)
Pt a/o, hypoactive bs

## 2018-01-23 NOTE — ED Triage Notes (Signed)
Pt arrived with complaints of constipation for the last 4 days. Pt has attempted relief with OTC medication with no relief. Pt denies and pain.

## 2018-02-09 DIAGNOSIS — Z7982 Long term (current) use of aspirin: Secondary | ICD-10-CM | POA: Diagnosis not present

## 2018-02-09 DIAGNOSIS — E785 Hyperlipidemia, unspecified: Secondary | ICD-10-CM | POA: Diagnosis not present

## 2018-02-09 DIAGNOSIS — I5020 Unspecified systolic (congestive) heart failure: Secondary | ICD-10-CM | POA: Diagnosis not present

## 2018-02-09 DIAGNOSIS — M109 Gout, unspecified: Secondary | ICD-10-CM | POA: Diagnosis not present

## 2018-02-09 DIAGNOSIS — E440 Moderate protein-calorie malnutrition: Secondary | ICD-10-CM | POA: Diagnosis not present

## 2018-02-09 DIAGNOSIS — J301 Allergic rhinitis due to pollen: Secondary | ICD-10-CM | POA: Diagnosis not present

## 2018-02-09 DIAGNOSIS — G8929 Other chronic pain: Secondary | ICD-10-CM | POA: Diagnosis not present

## 2018-02-09 DIAGNOSIS — I2510 Atherosclerotic heart disease of native coronary artery without angina pectoris: Secondary | ICD-10-CM | POA: Diagnosis not present

## 2018-02-09 DIAGNOSIS — R110 Nausea: Secondary | ICD-10-CM | POA: Diagnosis not present

## 2018-02-09 DIAGNOSIS — F17200 Nicotine dependence, unspecified, uncomplicated: Secondary | ICD-10-CM | POA: Diagnosis not present

## 2018-02-09 DIAGNOSIS — I739 Peripheral vascular disease, unspecified: Secondary | ICD-10-CM | POA: Diagnosis not present

## 2018-02-09 DIAGNOSIS — I5022 Chronic systolic (congestive) heart failure: Secondary | ICD-10-CM | POA: Diagnosis not present

## 2018-02-09 DIAGNOSIS — N400 Enlarged prostate without lower urinary tract symptoms: Secondary | ICD-10-CM | POA: Diagnosis not present

## 2018-02-09 DIAGNOSIS — M545 Low back pain: Secondary | ICD-10-CM | POA: Diagnosis not present

## 2018-02-09 DIAGNOSIS — K5909 Other constipation: Secondary | ICD-10-CM | POA: Diagnosis not present

## 2018-02-09 DIAGNOSIS — I4891 Unspecified atrial fibrillation: Secondary | ICD-10-CM | POA: Diagnosis not present

## 2018-02-26 ENCOUNTER — Encounter: Payer: PPO | Attending: Cardiothoracic Surgery | Admitting: *Deleted

## 2018-02-26 ENCOUNTER — Encounter: Payer: Self-pay | Admitting: *Deleted

## 2018-02-26 VITALS — Ht 71.9 in | Wt 165.7 lb

## 2018-02-26 DIAGNOSIS — F17200 Nicotine dependence, unspecified, uncomplicated: Secondary | ICD-10-CM | POA: Diagnosis not present

## 2018-02-26 DIAGNOSIS — Z951 Presence of aortocoronary bypass graft: Secondary | ICD-10-CM

## 2018-02-26 DIAGNOSIS — I5020 Unspecified systolic (congestive) heart failure: Secondary | ICD-10-CM | POA: Insufficient documentation

## 2018-02-26 NOTE — Progress Notes (Signed)
Cardiac Individual Treatment Plan  Patient Details  Name: Duane Owens MRN: [id number] Date of Birth: 1940-08-02 Referring Provider:     Cardiac Rehab from 02/26/2018 in [facility] Cardiac and Pulmonary Rehab  Referring Provider  [name] MD [Attending: Elberta Spaniel, MD]      Initial Encounter Date:    Cardiac Rehab from 02/26/2018 in [facility] Cardiac and Pulmonary Rehab  Date  02/26/18      Visit Diagnosis: S/P CABG x 3  Patient's Home Medications on Admission:  Current Outpatient Medications:  .  allopurinol (ZYLOPRIM) 300 MG tablet, Take 300 mg by mouth daily., Disp: , Rfl:  .  atorvastatin (LIPITOR) 80 MG tablet, Take by mouth., Disp: , Rfl:  .  ELIQUIS 5 MG TABS tablet, , Disp: , Rfl:  .  hydrochlorothiazide (HYDRODIURIL) 12.5 MG tablet, Take 12.5 mg by mouth daily., Disp: , Rfl:  .  lactulose (CHRONULAC) 10 GM/15ML solution, Take 30 mLs (20 g total) by mouth 3 (three) times daily as needed for mild constipation or moderate constipation., Disp: 240 mL, Rfl: 3 .  oxyCODONE-acetaminophen (PERCOCET) 7.5-325 MG tablet, Take by mouth 2 (two) times daily., Disp: , Rfl:  .  aspirin 325 MG tablet, Take 325 mg by mouth daily., Disp: , Rfl:   Past Medical History: Past Medical History:  Diagnosis Date  . Anemia   . Atherosclerosis   . Cancer [facility])    prostate  . Hypercholesteremia   . Hyperlipemia   . Hypertension   . Spinal stenosis   . Varicose vein   . Venous insufficiency     Tobacco Use: Social History   Tobacco Use  Smoking Status Current Every Day Smoker  . Packs/day: 0.50  . Years: 5.00  . Pack years: 2.50  . Types: Cigarettes  Smokeless Tobacco Never Used  Tobacco Comment   Has called [place] [facility] and ordered nicotene patches. Will Quit when patches start    Labs: Recent Review Flowsheet Data    There is no flowsheet data to display.       Exercise Target Goals: Date: 02/26/18  Exercise Program Goal: Individual exercise prescription set using  results from initial 6 min walk test and THRR while considering  patient's activity barriers and safety.   Exercise Prescription Goal: Initial exercise prescription builds to 30-45 minutes a day of aerobic activity, 2-3 days per week.  Home exercise guidelines will be given to patient during program as part of exercise prescription that the participant will acknowledge.  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 HR  107 bpm     Max Ex. BP  156/74     2 Minute Post BP  142/62       Interval HR   Interval Heart Rate?  Yes        Oxygen Initial Assessment:   Oxygen Re-Evaluation:   Oxygen Discharge (Final Oxygen Re-Evaluation):   Initial Exercise Prescription: Initial Exercise Prescription - 02/26/18 1500      Date of Initial Exercise RX and Referring Provider   Date  02/26/18    Referring Provider  [name] MD Attending: Elberta Spaniel, MD      Treadmill   MPH  2.1    Grade  0.5    Minutes  15    METs  2.75      Recumbant Bike   Level  2    RPM  50    Watts  20    Minutes  15    METs  2.8      NuStep   Level  2    SPM  80    Minutes  15    METs  2.5      Prescription Details   Frequency (times per week)  2     Duration  Progress to 30 minutes of continuous aerobic without signs/symptoms of physical distress      Intensity   THRR 40-80% of Max Heartrate  91-125    Ratings of Perceived Exertion  11-13    Perceived Dyspnea  0-4      Progression   Progression  Continue to progress workloads to maintain intensity without signs/symptoms of physical distress.      Resistance Training   Training Prescription  Yes    Weight  3 lbs    Reps  10-15       Perform Capillary Blood Glucose checks as needed.  Exercise Prescription Changes: Exercise Prescription Changes    Row Name 02/26/18 1200             Response to Exercise   Blood Pressure (Admit)  126/74       Blood Pressure (Exercise)  156/74       Blood Pressure (Exit)  142/62       Heart Rate (Admit)  57 bpm       Heart Rate (Exercise)  107 bpm       Heart Rate (Exit)  64 bpm       Oxygen Saturation (Admit)  97 %       Oxygen Saturation (Exercise)  99 %       Rating of Perceived Exertion (Exercise)  11       Symptoms  none       Comments  walk test results          Exercise Comments:   Exercise Goals and Review: Exercise Goals    Row Name 02/26/18 1523             Exercise Goals   Increase Physical Activity  Yes       Intervention  Provide advice, education, support and counseling about physical activity/exercise needs.;Develop an individualized exercise prescription for aerobic and resistive training based on initial evaluation findings, risk stratification, comorbidities and participant's personal goals.       Expected Outcomes  Short Term: Attend rehab on a regular basis to increase amount of physical activity.;Long Term: Add in home exercise to make exercise part of routine and to increase amount of physical activity.;Long Term: Exercising regularly at least 3-5 days a week.       Increase Strength and Stamina  Yes       Intervention  Provide advice, education, support and counseling about physical activity/exercise  needs.;Develop an individualized exercise prescription for aerobic and resistive training based on initial evaluation findings, risk stratification, comorbidities and participant's personal goals.       Expected Outcomes  Short Term: Increase workloads from initial exercise prescription for resistance, speed, and METs.;Long Term: Improve cardiorespiratory fitness, muscular endurance and strength as measured by increased METs and functional capacity (6MWT);Short Term: Perform resistance training exercises routinely during rehab and add in resistance training at home       Able to understand and use rate of perceived exertion (RPE) scale  Yes       Intervention  Provide education and explanation on how to use RPE scale       Expected Outcomes  Short Term: Able to use RPE daily in rehab to express subjective intensity level;Long Term:  Able to use RPE to guide intensity level when exercising independently       Able to understand and use Dyspnea scale  Yes       Intervention  Provide education and explanation on how to use Dyspnea scale       Expected Outcomes  Short Term: Able to use Dyspnea scale daily in rehab to express subjective sense of shortness of breath during exertion;Long Term: Able to use Dyspnea scale to guide intensity level when exercising independently       Knowledge and understanding of Target Heart Rate Range (THRR)  Yes       Intervention  Provide education and explanation of THRR including how the numbers were predicted and where they are located for reference       Expected Outcomes  Short Term: Able to state/look up THRR;Short Term: Able to use daily as guideline for intensity in rehab;Long Term: Able to use THRR to govern intensity when exercising independently       Able to check pulse independently  Yes       Intervention  Provide education and demonstration on how to check pulse in carotid and radial arteries.;Review the importance of being able to check your own pulse for safety  during independent exercise       Expected Outcomes  Short Term: Able to explain why pulse checking is important during independent exercise;Long Term: Able to check pulse independently and accurately       Understanding of Exercise Prescription  Yes       Intervention  Provide education, explanation, and written materials on patient's individual exercise prescription       Expected Outcomes  Short Term: Able to explain program exercise prescription;Long Term: Able to explain home exercise prescription to exercise independently          Exercise Goals Re-Evaluation :   Discharge Exercise Prescription (Final Exercise Prescription Changes): Exercise Prescription Changes - 02/26/18 1200      Response to Exercise   Blood Pressure (Admit)  126/74    Blood Pressure (Exercise)  156/74    Blood Pressure (Exit)  142/62    Heart Rate (Admit)  57 bpm    Heart Rate (Exercise)  107 bpm    Heart Rate (Exit)  64 bpm    Oxygen Saturation (Admit)  97 %    Oxygen Saturation (Exercise)  99 %    Rating of Perceived Exertion (Exercise)  11    Symptoms  none    Comments  walk test results       Nutrition:  Target Goals: Understanding of nutrition guidelines, daily intake of sodium <1558m, cholesterol <2024m calories 30% from fat and 7% or less from saturated fats, daily to have 5 or more servings of fruits and vegetables.  Biometrics: Pre Biometrics - 02/26/18 1524      Pre Biometrics   Height  5' 11.9" (1.826 m)    Weight  165 lb 11.2 oz (75.2 kg)    Waist Circumference  33.5 inches    Hip Circumference  40 inches    Waist to Hip Ratio  0.84 %    BMI (Calculated)  22.54    Single Leg Stand  9.25 seconds        Nutrition Therapy Plan and Nutrition Goals: Nutrition Therapy & Goals - 02/26/18 1232      Intervention Plan   Intervention  Prescribe, educate and counsel regarding individualized specific dietary modifications aiming towards targeted core components such as weight,  hypertension, lipid management, diabetes, heart failure and other comorbidities.    Expected Outcomes  Short Term Goal: Understand basic principles of dietary content, such as calories, fat, sodium, cholesterol and nutrients.;Short Term Goal: A plan has been developed with personal nutrition goals set during dietitian appointment.;Long Term Goal: Adherence to prescribed nutrition plan.       Nutrition Assessments: Nutrition Assessments - 02/26/18 1232      MEDFICTS Scores   Pre Score  21       Nutrition Goals Re-Evaluation:   Nutrition Goals Discharge (Final Nutrition Goals Re-Evaluation):   Psychosocial: Target Goals: Acknowledge presence or absence of significant depression and/or stress, maximize coping skills, provide positive support system. Participant is able to verbalize types and ability to use techniques and skills needed for reducing stress and depression.   Initial Review & Psychosocial Screening: Initial Psych Review & Screening - 02/26/18 1238      Initial Review   Current issues with  None Identified      Family Dynamics   Good Support System?  Yes daughters      Barriers   Psychosocial barriers to participate in program  The patient should benefit from training in stress management and relaxation.;There are no identifiable barriers or psychosocial needs.      Screening Interventions   Interventions  Encouraged to exercise;To provide support and resources with identified psychosocial needs;Provide feedback about the scores to participant    Expected Outcomes  Short Term goal: Utilizing psychosocial counselor, staff and physician to assist with identification of specific Stressors or current issues interfering with healing process. Setting desired goal for each stressor or current issue identified.;Long Term Goal: Stressors or current issues are controlled or eliminated.;Short Term goal: Identification and review with participant of any Quality of Life or Depression  concerns found by scoring the questionnaire.;Long Term goal: The participant improves quality of Life and PHQ9 Scores as seen by post scores and/or verbalization of changes       Quality of Life Scores:  Quality of Life - 02/26/18 1238      Quality of Life   Select  Quality of Life      Quality of Life Scores   Health/Function Pre  22.6 %    Socioeconomic Pre  21.43 %  Psych/Spiritual Pre  28.29 %    Family Pre  24 %    GLOBAL Pre  23.74 %      Scores of 19 and below usually indicate a poorer quality of life in these areas.  A difference of  2-3 points is a clinically meaningful difference.  A difference of 2-3 points in the total score of the Quality of Life Index has been associated with significant improvement in overall quality of life, self-image, physical symptoms, and general health in studies assessing change in quality of life.  PHQ-9: Recent Review Flowsheet Data    Depression screen [facility] 2/9 02/26/2018 11/01/2016   Decreased Interest 1 0   Down, Depressed, Hopeless 0 0   PHQ - 2 Score 1 0   Altered sleeping 0 -   Tired, decreased energy 3 -   Change in appetite 3 -   Feeling bad or failure about yourself  0 -   Trouble concentrating 0 -   Moving slowly or fidgety/restless 0 -   Suicidal thoughts 0 -   PHQ-9 Score 7 -   Difficult doing work/chores Not difficult at all -     Interpretation of Total Score  Total Score Depression Severity:  1-4 = Minimal depression, 5-9 = Mild depression, 10-14 = Moderate depression, 15-19 = Moderately severe depression, 20-27 = Severe depression   Psychosocial Evaluation and Intervention:   Psychosocial Re-Evaluation:   Psychosocial Discharge (Final Psychosocial Re-Evaluation):   Vocational Rehabilitation: Provide vocational rehab assistance to qualifying candidates.   Vocational Rehab Evaluation & Intervention: Vocational Rehab - 02/26/18 1244      Initial Vocational Rehab Evaluation & Intervention   Assessment shows  need for Vocational Rehabilitation  No       Education: Education Goals: Education classes will be provided on a variety of topics geared toward better understanding of heart health and risk factor modification. Participant will state understanding/return demonstration of topics presented as noted by education test scores.  Learning Barriers/Preferences: Learning Barriers/Preferences - 02/26/18 1243      Learning Barriers/Preferences   Learning Barriers  Hearing    Learning Preferences  None       Education Topics:  AED/CPR: - Group verbal and written instruction with the use of models to demonstrate the basic use of the AED with the basic ABC's of resuscitation.   General Nutrition Guidelines/Fats and Fiber: -Group instruction provided by verbal, written material, models and posters to present the general guidelines for heart healthy nutrition. Gives an explanation and review of dietary fats and fiber.   Controlling Sodium/Reading Food Labels: -Group verbal and written material supporting the discussion of sodium use in heart healthy nutrition. Review and explanation with models, verbal and written materials for utilization of the food label.   Exercise Physiology & General Exercise Guidelines: - Group verbal and written instruction with models to review the exercise physiology of the cardiovascular system and associated critical values. Provides general exercise guidelines with specific guidelines to those with heart or lung disease.    Aerobic Exercise & Resistance Training: - Gives group verbal and written instruction on the various components of exercise. Focuses on aerobic and resistive training programs and the benefits of this training and how to safely progress through these programs..   Flexibility, Balance, Mind/Body Relaxation: Provides group verbal/written instruction on the benefits of flexibility and balance training, including mind/body exercise modes such as  yoga, pilates and tai chi.  Demonstration and skill practice provided.   Stress and Anxiety: -  Provides group verbal and written instruction about the health risks of elevated stress and causes of high stress.  Discuss the correlation between heart/lung disease and anxiety and treatment options. Review healthy ways to manage with stress and anxiety.   Depression: - Provides group verbal and written instruction on the correlation between heart/lung disease and depressed mood, treatment options, and the stigmas associated with seeking treatment.   Anatomy & Physiology of the Heart: - Group verbal and written instruction and models provide basic cardiac anatomy and physiology, with the coronary electrical and arterial systems. Review of Valvular disease and Heart Failure   Cardiac Procedures: - Group verbal and written instruction to review commonly prescribed medications for heart disease. Reviews the medication, class of the drug, and side effects. Includes the steps to properly store meds and maintain the prescription regimen. (beta blockers and nitrates)   Cardiac Medications I: - Group verbal and written instruction to review commonly prescribed medications for heart disease. Reviews the medication, class of the drug, and side effects. Includes the steps to properly store meds and maintain the prescription regimen.   Cardiac Medications II: -Group verbal and written instruction to review commonly prescribed medications for heart disease. Reviews the medication, class of the drug, and side effects. (all other drug classes)    Go Sex-Intimacy & Heart Disease, Get SMART - Goal Setting: - Group verbal and written instruction through game format to discuss heart disease and the return to sexual intimacy. Provides group verbal and written material to discuss and apply goal setting through the application of the S.M.A.R.T. Method.   Other Matters of the Heart: - Provides group verbal,  written materials and models to describe Stable Angina and Peripheral Artery. Includes description of the disease process and treatment options available to the cardiac patient.   Exercise & Equipment Safety: - Individual verbal instruction and demonstration of equipment use and safety with use of the equipment.   Cardiac Rehab from 02/26/2018 in [facility] Cardiac and Pulmonary Rehab  Date  02/26/18  Educator  [facility]  Instruction Review Code  1- Verbalizes Understanding      Infection Prevention: - Provides verbal and written material to individual with discussion of infection control including proper hand washing and proper equipment cleaning during exercise session.   Cardiac Rehab from 02/26/2018 in [facility] Cardiac and Pulmonary Rehab  Date  02/26/18  Educator  [facility]  Instruction Review Code  1- Verbalizes Understanding      Falls Prevention: - Provides verbal and written material to individual with discussion of falls prevention and safety.   Cardiac Rehab from 02/26/2018 in [facility] Cardiac and Pulmonary Rehab  Date  02/26/18  Educator  [facility]  Instruction Review Code  1- Verbalizes Understanding      Diabetes: - Individual verbal and written instruction to review signs/symptoms of diabetes, desired ranges of glucose level fasting, after meals and with exercise. Acknowledge that pre and post exercise glucose checks will be done for 3 sessions at entry of program.   Cardiac Rehab from 02/26/2018 in [facility] Cardiac and Pulmonary Rehab  Date  02/26/18  Educator  [facility]  Instruction Review Code  1- Verbalizes Understanding      Know Your Numbers and Risk Factors: -Group verbal and written instruction about important numbers in your health.  Discussion of what are risk factors and how they play a role in the disease process.  Review of Cholesterol, Blood Pressure, Diabetes, and BMI and the role they play in your overall health.  Sleep Hygiene: -Provides group verbal and written instruction about how sleep  can affect your health.  Define sleep hygiene, discuss sleep cycles and impact of sleep habits. Review good sleep hygiene tips.    Other: -Provides group and verbal instruction on various topics (see comments)   Knowledge Questionnaire Score: Knowledge Questionnaire Score - 02/26/18 1243      Knowledge Questionnaire Score   Pre Score  18/26 Reviewed correct responses with Ray today. He verbalized understanding       Core Components/Risk Factors/Patient Goals at Admission: Personal Goals and Risk Factors at Admission - 02/26/18 1235      Core Components/Risk Factors/Patient Goals on Admission    Weight Management  Yes;Weight Maintenance;Weight Gain    Intervention  Weight Management: Develop a combined nutrition and exercise program designed to reach desired caloric intake, while maintaining appropriate intake of nutrient and fiber, sodium and fats, and appropriate energy expenditure required for the weight goal.;Weight Management: Provide education and appropriate resources to help participant work on and attain dietary goals.    Admit Weight  165 lb 11.2 oz (75.2 kg)    Goal Weight: Short Term  169 lb (76.7 kg)    Goal Weight: Long Term  175 lb (79.4 kg)    Expected Outcomes  Short Term: Continue to assess and modify interventions until short term weight is achieved;Long Term: Adherence to nutrition and physical activity/exercise program aimed toward attainment of established weight goal;Weight Gain: Understanding of general recommendations for a high calorie, high protein meal plan that promotes weight gain by distributing calorie intake throughout the day with the consumption for 4-5 meals, snacks, and/or supplements    Tobacco Cessation  Yes    Intervention  Assist the participant in steps to quit. Provide individualized education and counseling about committing to Tobacco Cessation, relapse prevention, and pharmacological support that can be provided by physician.;Comptroller, assist with locating and accessing local/national Quit Smoking programs, and support quit date choice.    Expected Outcomes  Short Term: Will demonstrate readiness to quit, by selecting a quit date.;Short Term: Will quit all tobacco product use, adhering to prevention of relapse plan.;Long Term: Complete abstinence from all tobacco products for at least 12 months from quit date.    Heart Failure  Yes    Intervention  Provide a combined exercise and nutrition program that is supplemented with education, support and counseling about heart failure. Directed toward relieving symptoms such as shortness of breath, decreased exercise tolerance, and extremity edema.    Expected Outcomes  Improve functional capacity of life;Short term: Attendance in program 2-3 days a week with increased exercise capacity. Reported lower sodium intake. Reported increased fruit and vegetable intake. Reports medication compliance.;Short term: Daily weights obtained and reported for increase. Utilizing diuretic protocols set by physician.;Long term: Adoption of self-care skills and reduction of barriers for early signs and symptoms recognition and intervention leading to self-care maintenance.    Hypertension  Yes    Intervention  Provide education on lifestyle modifcations including regular physical activity/exercise, weight management, moderate sodium restriction and increased consumption of fresh fruit, vegetables, and low fat dairy, alcohol moderation, and smoking cessation.;Monitor prescription use compliance.    Expected Outcomes  Short Term: Continued assessment and intervention until BP is < 140/11m HG in hypertensive participants. < 130/879mHG in hypertensive participants with diabetes, heart failure or chronic kidney disease.;Long Term: Maintenance of blood pressure at goal levels.    Lipids  Yes    Intervention  Provide  education and support for participant on nutrition & aerobic/resistive exercise along with  prescribed medications to achieve LDL <68m, HDL >435m    Expected Outcomes  Short Term: Participant states understanding of desired cholesterol values and is compliant with medications prescribed. Participant is following exercise prescription and nutrition guidelines.;Long Term: Cholesterol controlled with medications as prescribed, with individualized exercise RX and with personalized nutrition plan. Value goals: LDL < 7044mHDL > 40 mg.       Core Components/Risk Factors/Patient Goals Review:    Core Components/Risk Factors/Patient Goals at Discharge (Final Review):    ITP Comments: ITP Comments    Row Name 02/26/18 1208 02/26/18 1209         ITP Comments  Medical review completed today. ITP sent to Dr B KCaryn Sectionr review,changes as needed and signature. [facility]ation of diagnosis [facility] [facility] ITP sent to Dr B KCaryn Sectionr review,changes as needed and signature. [facility]ation of diagnosis [facility]         Comments: Initial ITP

## 2018-02-26 NOTE — Patient Instructions (Signed)
Patient Instructions  Patient Details  Name: Duane Owens MRN: [id number] Date of Birth: 25-Mar-1941 Referring Provider:  Gustavo Lah, MD  Below are your personal goals for exercise, nutrition, and risk factors. Our goal is to help you stay on track towards obtaining and maintaining these goals. We will be discussing your progress on these goals with you throughout the program.  Initial Exercise Prescription: Initial Exercise Prescription - 02/26/18 1500      Date of Initial Exercise RX and Referring Provider   Date  02/26/18    Referring Provider  [name] MD Attending: Elberta Spaniel, MD      Treadmill   MPH  2.1    Grade  0.5    Minutes  15    METs  2.75      Recumbant Bike   Level  2    RPM  50    Watts  20    Minutes  15    METs  2.8      NuStep   Level  2    SPM  80    Minutes  15    METs  2.5      Prescription Details   Frequency (times per week)  2    Duration  Progress to 30 minutes of continuous aerobic without signs/symptoms of physical distress      Intensity   THRR 40-80% of Max Heartrate  91-125    Ratings of Perceived Exertion  11-13    Perceived Dyspnea  0-4      Progression   Progression  Continue to progress workloads to maintain intensity without signs/symptoms of physical distress.      Resistance Training   Training Prescription  Yes    Weight  3 lbs    Reps  10-15       Exercise Goals: Frequency: Be able to perform aerobic exercise two to three times per week in program working toward 2-5 days per week of home exercise.  Intensity: Work with a perceived exertion of 11 (fairly light) - 15 (hard) while following your exercise prescription.  We will make changes to your prescription with you as you progress through the program.   Duration: Be able to do 30 to 45 minutes of continuous aerobic exercise in addition to a 5 minute warm-up and a 5 minute cool-down routine.   Nutrition Goals: Your personal nutrition goals  will be established when you do your nutrition analysis with the dietician.  The following are general nutrition guidelines to follow: Cholesterol < 200mg /day Sodium < 1500mg /day Fiber: Men over 50 yrs - 30 grams per day  Personal Goals: Personal Goals and Risk Factors at Admission - 02/26/18 1235      Core Components/Risk Factors/Patient Goals on Admission    Weight Management  Yes;Weight Maintenance;Weight Gain    Intervention  Weight Management: Develop a combined nutrition and exercise program designed to reach desired caloric intake, while maintaining appropriate intake of nutrient and fiber, sodium and fats, and appropriate energy expenditure required for the weight goal.;Weight Management: Provide education and appropriate resources to help participant work on and attain dietary goals.    Admit Weight  165 lb 11.2 oz (75.2 kg)    Goal Weight: Short Term  169 lb (76.7 kg)    Goal Weight: Long Term  175 lb (79.4 kg)    Expected Outcomes  Short Term: Continue to assess and modify interventions until short term weight is achieved;Long Term: Adherence to nutrition and  physical activity/exercise program aimed toward attainment of established weight goal;Weight Gain: Understanding of general recommendations for a high calorie, high protein meal plan that promotes weight gain by distributing calorie intake throughout the day with the consumption for 4-5 meals, snacks, and/or supplements    Tobacco Cessation  Yes    Intervention  Assist the participant in steps to quit. Provide individualized education and counseling about committing to Tobacco Cessation, relapse prevention, and pharmacological support that can be provided by physician.;Advice worker, assist with locating and accessing local/national Quit Smoking programs, and support quit date choice.    Expected Outcomes  Short Term: Will demonstrate readiness to quit, by selecting a quit date.;Short Term: Will quit all tobacco  product use, adhering to prevention of relapse plan.;Long Term: Complete abstinence from all tobacco products for at least 12 months from quit date.    Heart Failure  Yes    Intervention  Provide a combined exercise and nutrition program that is supplemented with education, support and counseling about heart failure. Directed toward relieving symptoms such as shortness of breath, decreased exercise tolerance, and extremity edema.    Expected Outcomes  Improve functional capacity of life;Short term: Attendance in program 2-3 days a week with increased exercise capacity. Reported lower sodium intake. Reported increased fruit and vegetable intake. Reports medication compliance.;Short term: Daily weights obtained and reported for increase. Utilizing diuretic protocols set by physician.;Long term: Adoption of self-care skills and reduction of barriers for early signs and symptoms recognition and intervention leading to self-care maintenance.    Hypertension  Yes    Intervention  Provide education on lifestyle modifcations including regular physical activity/exercise, weight management, moderate sodium restriction and increased consumption of fresh fruit, vegetables, and low fat dairy, alcohol moderation, and smoking cessation.;Monitor prescription use compliance.    Expected Outcomes  Short Term: Continued assessment and intervention until BP is < 140/76mm HG in hypertensive participants. < 130/60mm HG in hypertensive participants with diabetes, heart failure or chronic kidney disease.;Long Term: Maintenance of blood pressure at goal levels.    Lipids  Yes    Intervention  Provide education and support for participant on nutrition & aerobic/resistive exercise along with prescribed medications to achieve LDL 70mg , HDL >40mg .    Expected Outcomes  Short Term: Participant states understanding of desired cholesterol values and is compliant with medications prescribed. Participant is following exercise prescription  and nutrition guidelines.;Long Term: Cholesterol controlled with medications as prescribed, with individualized exercise RX and with personalized nutrition plan. Value goals: LDL < 70mg , HDL > 40 mg.       Tobacco Use Initial Evaluation: Social History   Tobacco Use  Smoking Status Current Every Day Smoker  . Packs/day: 0.50  . Years: 5.00  . Pack years: 2.50  . Types: Cigarettes  Smokeless Tobacco Never Used  Tobacco Comment   Has called [place] [facility] and ordered nicotene patches. Will Quit when patches start    Exercise Goals and Review: Exercise Goals    Row Name 02/26/18 1523             Exercise Goals   Increase Physical Activity  Yes       Intervention  Provide advice, education, support and counseling about physical activity/exercise needs.;Develop an individualized exercise prescription for aerobic and resistive training based on initial evaluation findings, risk stratification, comorbidities and participant's personal goals.       Expected Outcomes  Short Term: Attend rehab on a regular basis to increase amount of physical activity.;Long Term:  Add in home exercise to make exercise part of routine and to increase amount of physical activity.;Long Term: Exercising regularly at least 3-5 days a week.       Increase Strength and Stamina  Yes       Intervention  Provide advice, education, support and counseling about physical activity/exercise needs.;Develop an individualized exercise prescription for aerobic and resistive training based on initial evaluation findings, risk stratification, comorbidities and participant's personal goals.       Expected Outcomes  Short Term: Increase workloads from initial exercise prescription for resistance, speed, and METs.;Long Term: Improve cardiorespiratory fitness, muscular endurance and strength as measured by increased METs and functional capacity (6MWT);Short Term: Perform resistance training exercises routinely during rehab and add in  resistance training at home       Able to understand and use rate of perceived exertion (RPE) scale  Yes       Intervention  Provide education and explanation on how to use RPE scale       Expected Outcomes  Short Term: Able to use RPE daily in rehab to express subjective intensity level;Long Term:  Able to use RPE to guide intensity level when exercising independently       Able to understand and use Dyspnea scale  Yes       Intervention  Provide education and explanation on how to use Dyspnea scale       Expected Outcomes  Short Term: Able to use Dyspnea scale daily in rehab to express subjective sense of shortness of breath during exertion;Long Term: Able to use Dyspnea scale to guide intensity level when exercising independently       Knowledge and understanding of Target Heart Rate Range (THRR)  Yes       Intervention  Provide education and explanation of THRR including how the numbers were predicted and where they are located for reference       Expected Outcomes  Short Term: Able to state/look up THRR;Short Term: Able to use daily as guideline for intensity in rehab;Long Term: Able to use THRR to govern intensity when exercising independently       Able to check pulse independently  Yes       Intervention  Provide education and demonstration on how to check pulse in carotid and radial arteries.;Review the importance of being able to check your own pulse for safety during independent exercise       Expected Outcomes  Short Term: Able to explain why pulse checking is important during independent exercise;Long Term: Able to check pulse independently and accurately       Understanding of Exercise Prescription  Yes       Intervention  Provide education, explanation, and written materials on patient's individual exercise prescription       Expected Outcomes  Short Term: Able to explain program exercise prescription;Long Term: Able to explain home exercise prescription to exercise independently           Copy of goals given to participant.

## 2018-03-01 ENCOUNTER — Encounter: Payer: PPO | Admitting: *Deleted

## 2018-03-01 DIAGNOSIS — F17200 Nicotine dependence, unspecified, uncomplicated: Secondary | ICD-10-CM | POA: Diagnosis not present

## 2018-03-01 DIAGNOSIS — Z951 Presence of aortocoronary bypass graft: Secondary | ICD-10-CM

## 2018-03-01 NOTE — Progress Notes (Signed)
Daily Session Note  Patient Details  Name: Duane Owens MRN: [id number] Date of Birth: [birth year] Referring Provider:     Cardiac Rehab from 02/26/2018 in [facility] Cardiac and Pulmonary Rehab  Referring Provider  [name] MD [Attending: Elberta Spaniel, MD]      Encounter Date: 03/01/2018  Check In: Session Check In - 03/01/18 0908      Check-In   Supervising physician immediately available to respond to emergencies  See telemetry face sheet for immediately available ER MD    Location  [facility]-Cardiac & Pulmonary Rehab    Staff Present  Renita Papa, RN BSN;Mandi Marlin, BS, PEC;Kamal Jurgens [place], MA, RCEP, CCRP, Exercise Physiologist;Amanda Oletta Darter, [place], ACSM CEP, Exercise Physiologist    Medication changes reported      No    Fall or balance concerns reported     No    Warm-up and Cool-down  Performed on first and last piece of equipment    Resistance Training Performed  Yes    VAD Patient?  No    PAD/SET Patient?  No      Pain Assessment   Currently in Pain?  No/denies          Social History   Tobacco Use  Smoking Status Current Every Day Smoker  . Packs/day: 0.50  . Years: 5.00  . Pack years: 2.50  . Types: Cigarettes  Smokeless Tobacco Never Used  Tobacco Comment   Has called [place] [facility] and ordered nicotene patches. Will Quit when patches start    Goals Met:  Exercise tolerated well Personal goals reviewed No report of cardiac concerns or symptoms Strength training completed today  Goals Unmet:  Not Applicable  Comments: First full day of exercise!  Patient was oriented to gym and equipment including functions, settings, policies, and procedures.  Patient's individual exercise prescription and treatment plan were reviewed.  All starting workloads were established based on the results of the 6 minute walk test done at initial orientation visit.  The plan for exercise progression was also introduced and progression will be customized based on patient's  performance and goals.    Dr. Emily Filbert is Medical Director for [place] and [facility].

## 2018-03-06 ENCOUNTER — Encounter: Payer: PPO | Admitting: *Deleted

## 2018-03-06 DIAGNOSIS — Z951 Presence of aortocoronary bypass graft: Secondary | ICD-10-CM

## 2018-03-06 DIAGNOSIS — F17200 Nicotine dependence, unspecified, uncomplicated: Secondary | ICD-10-CM | POA: Diagnosis not present

## 2018-03-06 NOTE — Progress Notes (Signed)
Daily Session Note  Patient Details  Name: Duane Owens MRN: [id number] Date of Birth: 1940-12-05 Referring Provider:     Cardiac Rehab from 02/26/2018 in [facility] Cardiac and Pulmonary Rehab  Referring Provider  [name] MD [Attending: Elberta Spaniel, MD]      Encounter Date: 03/06/2018  Check In: Session Check In - 03/06/18 0810      Check-In   Supervising physician immediately available to respond to emergencies  See telemetry face sheet for immediately available ER MD    Location  [facility]-Cardiac & Pulmonary Rehab    Staff Present  Joellyn Rued, BS, PEC;Susanne Bice, RN, BSN, CCRP;Jessica [place], MA, RCEP, CCRP, Exercise Physiologist;Vartan Kerins Oletta Darter, BA, ACSM CEP, Exercise Physiologist    Medication changes reported      No    Fall or balance concerns reported     No    Warm-up and Cool-down  Performed on first and last piece of equipment    Resistance Training Performed  Yes    VAD Patient?  No    PAD/SET Patient?  No      Pain Assessment   Currently in Pain?  No/denies          Social History   Tobacco Use  Smoking Status Current Every Day Smoker  . Packs/day: 0.50  . Years: 5.00  . Pack years: 2.50  . Types: Cigarettes  Smokeless Tobacco Never Used  Tobacco Comment   Has called [place] [facility] and ordered nicotene patches. Will Quit when patches start    Goals Met:  Independence with exercise equipment Exercise tolerated well No report of cardiac concerns or symptoms Strength training completed today  Goals Unmet:  Not Applicable  Comments: Pt able to follow exercise prescription today without complaint.  Will continue to monitor for progression.    Dr. Emily Filbert is Medical Director for [place] and [facility].

## 2018-03-07 ENCOUNTER — Encounter: Payer: Self-pay | Admitting: *Deleted

## 2018-03-07 DIAGNOSIS — Z951 Presence of aortocoronary bypass graft: Secondary | ICD-10-CM

## 2018-03-07 NOTE — Progress Notes (Signed)
Cardiac Individual Treatment Plan  Patient Details  Name: Duane Owens MRN: [id number] Date of Birth: 1940-10-18 Referring Provider:     Cardiac Rehab from 02/26/2018 in [facility] Cardiac and Pulmonary Rehab  Referring Provider  [name] MD [Attending: Elberta Spaniel, MD]      Initial Encounter Date:    Cardiac Rehab from 02/26/2018 in [facility] Cardiac and Pulmonary Rehab  Date  02/26/18      Visit Diagnosis: S/P CABG x 3  Patient's Home Medications on Admission:  Current Outpatient Medications:  .  allopurinol (ZYLOPRIM) 300 MG tablet, Take 300 mg by mouth daily., Disp: , Rfl:  .  aspirin 325 MG tablet, Take 325 mg by mouth daily., Disp: , Rfl:  .  atorvastatin (LIPITOR) 80 MG tablet, Take by mouth., Disp: , Rfl:  .  ELIQUIS 5 MG TABS tablet, , Disp: , Rfl:  .  hydrochlorothiazide (HYDRODIURIL) 12.5 MG tablet, Take 12.5 mg by mouth daily., Disp: , Rfl:  .  lactulose (CHRONULAC) 10 GM/15ML solution, Take 30 mLs (20 g total) by mouth 3 (three) times daily as needed for mild constipation or moderate constipation., Disp: 240 mL, Rfl: 3 .  oxyCODONE-acetaminophen (PERCOCET) 7.5-325 MG tablet, Take by mouth 2 (two) times daily., Disp: , Rfl:   Past Medical History: Past Medical History:  Diagnosis Date  . Anemia   . Atherosclerosis   . Cancer [facility])    prostate  . Hypercholesteremia   . Hyperlipemia   . Hypertension   . Spinal stenosis   . Varicose vein   . Venous insufficiency     Tobacco Use: Social History   Tobacco Use  Smoking Status Current Every Day Smoker  . Packs/day: 0.50  . Years: 5.00  . Pack years: 2.50  . Types: Cigarettes  Smokeless Tobacco Never Used  Tobacco Comment   Has called [facility] and ordered nicotene patches. Will Quit when patches start    Labs: Recent Review Flowsheet Data    There is no flowsheet data to display.       Exercise Target Goals: Exercise Program Goal: Individual exercise prescription set using results from  initial 6 min walk test and THRR while considering  patient's activity barriers and safety.   Exercise Prescription Goal: Initial exercise prescription builds to 30-45 minutes a day of aerobic activity, 2-3 days per week.  Home exercise guidelines will be given to patient during program as part of exercise prescription that the participant will acknowledge.  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 HR  107 bpm     Max Ex. BP  156/74     2 Minute Post BP  142/62       Interval HR   Interval Heart Rate?  Yes        Oxygen Initial Assessment:   Oxygen Re-Evaluation:   Oxygen Discharge (Final Oxygen Re-Evaluation):   Initial Exercise Prescription: Initial Exercise Prescription - 02/26/18 1500      Date of Initial Exercise RX and Referring Provider   Date  02/26/18    Referring Provider  [name] MD   Attending: Elberta Spaniel, MD     Treadmill   MPH  2.1    Grade  0.5    Minutes  15    METs  2.75      Recumbant Bike   Level  2    RPM  50    Watts  20    Minutes  15    METs  2.8      NuStep   Level  2    SPM  80    Minutes  15    METs  2.5      Prescription Details   Frequency (times per week)  2    Duration   Progress to 30 minutes of continuous aerobic without signs/symptoms of physical distress      Intensity   THRR 40-80% of Max Heartrate  91-125    Ratings of Perceived Exertion  11-13    Perceived Dyspnea  0-4      Progression   Progression  Continue to progress workloads to maintain intensity without signs/symptoms of physical distress.      Resistance Training   Training Prescription  Yes    Weight  3 lbs    Reps  10-15       Perform Capillary Blood Glucose checks as needed.  Exercise Prescription Changes: Exercise Prescription Changes    Row Name 02/26/18 1200             Response to Exercise   Blood Pressure (Admit)  126/74       Blood Pressure (Exercise)  156/74       Blood Pressure (Exit)  142/62       Heart Rate (Admit)  57 bpm       Heart Rate (Exercise)  107 bpm       Heart Rate (Exit)  64 bpm       Oxygen Saturation (Admit)  97 %       Oxygen Saturation (Exercise)  99 %       Rating of Perceived Exertion (Exercise)  11       Symptoms  none       Comments  walk test results          Exercise Comments: Exercise Comments    Row Name 03/01/18 0916           Exercise Comments   First full day of exercise!  Patient was oriented to gym and equipment including functions, settings, policies, and procedures.  Patient's individual exercise prescription and treatment plan were reviewed.  All starting workloads were established based on the results of the 6 minute walk test done at initial orientation visit.  The plan for exercise progression was also introduced and progression  will be customized based on patient's performance and goals.          Exercise Goals and Review: Exercise Goals    Row Name 02/26/18 1523             Exercise Goals   Increase Physical Activity  Yes       Intervention  Provide advice, education, support and counseling about physical activity/exercise needs.;Develop an individualized exercise prescription for aerobic and resistive  training based on initial evaluation findings, risk stratification, comorbidities and participant's personal goals.       Expected Outcomes  Short Term: Attend rehab on a regular basis to increase amount of physical activity.;Long Term: Add in home exercise to make exercise part of routine and to increase amount of physical activity.;Long Term: Exercising regularly at least 3-5 days a week.       Increase Strength and Stamina  Yes       Intervention  Provide advice, education, support and counseling about physical activity/exercise needs.;Develop an individualized exercise prescription for aerobic and resistive training based on initial evaluation findings, risk stratification, comorbidities and participant's personal goals.       Expected Outcomes  Short Term: Increase workloads from initial exercise prescription for resistance, speed, and METs.;Long Term: Improve cardiorespiratory fitness, muscular endurance and strength as measured by increased METs and functional capacity (6MWT);Short Term: Perform resistance training exercises routinely during rehab and add in resistance training at home       Able to understand and use rate of perceived exertion (RPE) scale  Yes       Intervention  Provide education and explanation on how to use RPE scale       Expected Outcomes  Short Term: Able to use RPE daily in rehab to express subjective intensity level;Long Term:  Able to use RPE to guide intensity level when exercising independently       Able to understand and use Dyspnea scale  Yes       Intervention  Provide education and explanation on how to use Dyspnea scale       Expected Outcomes  Short Term: Able to use Dyspnea scale daily in rehab to express subjective sense of shortness of breath during exertion;Long Term: Able to use Dyspnea scale to guide intensity level when exercising independently       Knowledge and understanding of Target Heart Rate Range (THRR)  Yes       Intervention  Provide education  and explanation of THRR including how the numbers were predicted and where they are located for reference       Expected Outcomes  Short Term: Able to state/look up THRR;Short Term: Able to use daily as guideline for intensity in rehab;Long Term: Able to use THRR to govern intensity when exercising independently       Able to check pulse independently  Yes       Intervention  Provide education and demonstration on how to check pulse in carotid and radial arteries.;Review the importance of being able to check your own pulse for safety during independent exercise       Expected Outcomes  Short Term: Able to explain why pulse checking is important during independent exercise;Long Term: Able to check pulse independently and accurately       Understanding of Exercise Prescription  Yes       Intervention  Provide education, explanation, and written materials on patient's individual exercise prescription       Expected Outcomes  Short Term:  Able to explain program exercise prescription;Long Term: Able to explain home exercise prescription to exercise independently          Exercise Goals Re-Evaluation : Exercise Goals Re-Evaluation    Row Name 03/01/18 0916             Exercise Goal Re-Evaluation   Exercise Goals Review  Increase Physical Activity;Increase Strength and Stamina;Able to understand and use rate of perceived exertion (RPE) scale;Knowledge and understanding of Target Heart Rate Range (THRR);Understanding of Exercise Prescription       Comments  Reviewed RPE scale, THR and program prescription with pt today.  Pt voiced understanding and was given a copy of goals to take home.        Expected Outcomes  Short: Use RPE daily to regulate intensity. Long: Follow program prescription in THR.          Discharge Exercise Prescription (Final Exercise Prescription Changes): Exercise Prescription Changes - 02/26/18 1200      Response to Exercise   Blood Pressure (Admit)  126/74    Blood  Pressure (Exercise)  156/74    Blood Pressure (Exit)  142/62    Heart Rate (Admit)  57 bpm    Heart Rate (Exercise)  107 bpm    Heart Rate (Exit)  64 bpm    Oxygen Saturation (Admit)  97 %    Oxygen Saturation (Exercise)  99 %    Rating of Perceived Exertion (Exercise)  11    Symptoms  none    Comments  walk test results       Nutrition:  Target Goals: Understanding of nutrition guidelines, daily intake of sodium '1500mg'$ , cholesterol '200mg'$ , calories 30% from fat and 7% or less from saturated fats, daily to have 5 or more servings of fruits and vegetables.  Biometrics: Pre Biometrics - 02/26/18 1524      Pre Biometrics   Height  5' 11.9" (1.826 m)    Weight  165 lb 11.2 oz (75.2 kg)    Waist Circumference  33.5 inches    Hip Circumference  40 inches    Waist to Hip Ratio  0.84 %    BMI (Calculated)  22.54    Single Leg Stand  9.25 seconds        Nutrition Therapy Plan and Nutrition Goals: Nutrition Therapy & Goals - 03/06/18 0932      Nutrition Therapy   Diet  TLC    Drug/Food Interactions  Statins/Certain Fruits    Protein (specify units)  9oz    Fiber  30 grams    Whole Grain Foods  3 servings   does not always choose whole grains   Saturated Fats  16 max. grams    Fruits and Vegetables  4 servings/day   8 ideal. eats vegetables 2-3x/wk, eats fruits more regularly   Sodium  2000 grams      Personal Nutrition Goals   Nutrition Goal  Because you are only able to eat small amounts at meal times, consuming at least 1, ideally 2-3 snacks per day would help you meet your nutritional needs    Personal Goal #2  Drink shakes or nutritional drinks such as Ensure on a more regular basis; every other day if possible until you reach your 5-10# wt gain goal    Personal Goal #3  Continue to monitor sodium content in foods and to choose salt substitutes for seasoning, great job!    Comments  He is working to regain the weight he lost  in the hospital, but is only able to eat small  portions d/t early satiety. He is currently eating 2 regular meals and 1 snack per day. H/o drinking Ensure but is not currently drinking them consistently. Eats vegetables only a couple of times per week      Intervention Plan   Intervention  Prescribe, educate and counsel regarding individualized specific dietary modifications aiming towards targeted core components such as weight, hypertension, lipid management, diabetes, heart failure and other comorbidities.    Expected Outcomes  Short Term Goal: Understand basic principles of dietary content, such as calories, fat, sodium, cholesterol and nutrients.;Short Term Goal: A plan has been developed with personal nutrition goals set during dietitian appointment.;Long Term Goal: Adherence to prescribed nutrition plan.       Nutrition Assessments: Nutrition Assessments - 02/26/18 1232      MEDFICTS Scores   Pre Score  21       Nutrition Goals Re-Evaluation: Nutrition Goals Re-Evaluation    [place] Name 03/06/18 0939             Goals   Nutrition Goal  Because you are only able to eat small amounts at meal times, consuming at least 1, ideally 2-3 snacks per day would help you meet your nutritional needs       Comment  He c/o early satiety at meal times though desires weight gain. Current eating pattern and portion sizes are typically not sufficient to promote weight gain       Expected Outcome  He will increase his daily caloric intake through addtional snacks, or nutritional shakes if needed         Personal Goal #2 Re-Evaluation   Personal Goal #2  Drink shakes or nutritional drinks such as Ensure on a more regular basis; every other day if possible until you reach your 5-10# wt gain goal         Personal Goal #3 Re-Evaluation   Personal Goal #3  Continue to montior sodium content in foods and to choose salt substitutes for seasoning, great job!          Nutrition Goals Discharge (Final Nutrition Goals Re-Evaluation): Nutrition Goals  Re-Evaluation - 03/06/18 0939      Goals   Nutrition Goal  Because you are only able to eat small amounts at meal times, consuming at least 1, ideally 2-3 snacks per day would help you meet your nutritional needs    Comment  He c/o early satiety at meal times though desires weight gain. Current eating pattern and portion sizes are typically not sufficient to promote weight gain    Expected Outcome  He will increase his daily caloric intake through addtional snacks, or nutritional shakes if needed      Personal Goal #2 Re-Evaluation   Personal Goal #2  Drink shakes or nutritional drinks such as Ensure on a more regular basis; every other day if possible until you reach your 5-10# wt gain goal      Personal Goal #3 Re-Evaluation   Personal Goal #3  Continue to montior sodium content in foods and to choose salt substitutes for seasoning, great job!       Psychosocial: Target Goals: Acknowledge presence or absence of significant depression and/or stress, maximize coping skills, provide positive support system. Participant is able to verbalize types and ability to use techniques and skills needed for reducing stress and depression.   Initial Review & Psychosocial Screening: Initial Psych Review & Screening - 02/26/18 1238  Initial Review   Current issues with  None Identified      Family Dynamics   Good Support System?  Yes   daughters     Barriers   Psychosocial barriers to participate in program  The patient should benefit from training in stress management and relaxation.;There are no identifiable barriers or psychosocial needs.      Screening Interventions   Interventions  Encouraged to exercise;To provide support and resources with identified psychosocial needs;Provide feedback about the scores to participant    Expected Outcomes  Short Term goal: Utilizing psychosocial counselor, staff and physician to assist with identification of specific Stressors or current issues  interfering with healing process. Setting desired goal for each stressor or current issue identified.;Long Term Goal: Stressors or current issues are controlled or eliminated.;Short Term goal: Identification and review with participant of any Quality of Life or Depression concerns found by scoring the questionnaire.;Long Term goal: The participant improves quality of Life and PHQ9 Scores as seen by post scores and/or verbalization of changes       Quality of Life Scores:  Quality of Life - 02/26/18 1238      Quality of Life   Select  Quality of Life      Quality of Life Scores   Health/Function Pre  22.6 %    Socioeconomic Pre  21.43 %    Psych/Spiritual Pre  28.29 %    Family Pre  24 %    GLOBAL Pre  23.74 %      Scores of 19 and below usually indicate a poorer quality of life in these areas.  A difference of  2-3 points is a clinically meaningful difference.  A difference of 2-3 points in the total score of the Quality of Life Index has been associated with significant improvement in overall quality of life, self-image, physical symptoms, and general health in studies assessing change in quality of life.  PHQ-9: Recent Review Flowsheet Data    Depression screen [facility] 2/9 02/26/2018 11/01/2016   Decreased Interest 1 0   Down, Depressed, Hopeless 0 0   PHQ - 2 Score 1 0   Altered sleeping 0 -   Tired, decreased energy 3 -   Change in appetite 3 -   Feeling bad or failure about yourself  0 -   Trouble concentrating 0 -   Moving slowly or fidgety/restless 0 -   Suicidal thoughts 0 -   PHQ-9 Score 7 -   Difficult doing work/chores Not difficult at all -     Interpretation of Total Score  Total Score Depression Severity:  1-4 = Minimal depression, 5-9 = Mild depression, 10-14 = Moderate depression, 15-19 = Moderately severe depression, 20-27 = Severe depression   Psychosocial Evaluation and Intervention:   Psychosocial Re-Evaluation:   Psychosocial Discharge (Final Psychosocial  Re-Evaluation):   Vocational Rehabilitation: Provide vocational rehab assistance to qualifying candidates.   Vocational Rehab Evaluation & Intervention: Vocational Rehab - 02/26/18 1244      Initial Vocational Rehab Evaluation & Intervention   Assessment shows need for Vocational Rehabilitation  No       Education: Education Goals: Education classes will be provided on a variety of topics geared toward better understanding of heart health and risk factor modification. Participant will state understanding/return demonstration of topics presented as noted by education test scores.  Learning Barriers/Preferences: Learning Barriers/Preferences - 02/26/18 1243      Learning Barriers/Preferences   Learning Barriers  Hearing    Learning Preferences  None       Education Topics:  AED/CPR: - Group verbal and written instruction with the use of models to demonstrate the basic use of the AED with the basic ABC's of resuscitation.   General Nutrition Guidelines/Fats and Fiber: -Group instruction provided by verbal, written material, models and posters to present the general guidelines for heart healthy nutrition. Gives an explanation and review of dietary fats and fiber.   Controlling Sodium/Reading Food Labels: -Group verbal and written material supporting the discussion of sodium use in heart healthy nutrition. Review and explanation with models, verbal and written materials for utilization of the food label.   Exercise Physiology & General Exercise Guidelines: - Group verbal and written instruction with models to review the exercise physiology of the cardiovascular system and associated critical values. Provides general exercise guidelines with specific guidelines to those with heart or lung disease.    Aerobic Exercise & Resistance Training: - Gives group verbal and written instruction on the various components of exercise. Focuses on aerobic and resistive training programs and  the benefits of this training and how to safely progress through these programs..   Cardiac Rehab from 03/06/2018 in [facility] Cardiac and Pulmonary Rehab  Date  03/06/18  Educator  AS  Instruction Review Code  1- Verbalizes Understanding      Flexibility, Balance, Mind/Body Relaxation: Provides group verbal/written instruction on the benefits of flexibility and balance training, including mind/body exercise modes such as yoga, pilates and tai chi.  Demonstration and skill practice provided.   Stress and Anxiety: - Provides group verbal and written instruction about the health risks of elevated stress and causes of high stress.  Discuss the correlation between heart/lung disease and anxiety and treatment options. Review healthy ways to manage with stress and anxiety.   Depression: - Provides group verbal and written instruction on the correlation between heart/lung disease and depressed mood, treatment options, and the stigmas associated with seeking treatment.   Anatomy & Physiology of the Heart: - Group verbal and written instruction and models provide basic cardiac anatomy and physiology, with the coronary electrical and arterial systems. Review of Valvular disease and Heart Failure   Cardiac Procedures: - Group verbal and written instruction to review commonly prescribed medications for heart disease. Reviews the medication, class of the drug, and side effects. Includes the steps to properly store meds and maintain the prescription regimen. (beta blockers and nitrates)   Cardiac Medications I: - Group verbal and written instruction to review commonly prescribed medications for heart disease. Reviews the medication, class of the drug, and side effects. Includes the steps to properly store meds and maintain the prescription regimen.   Cardiac Medications II: -Group verbal and written instruction to review commonly prescribed medications for heart disease. Reviews the medication, class of  the drug, and side effects. (all other drug classes)    Go Sex-Intimacy & Heart Disease, Get SMART - Goal Setting: - Group verbal and written instruction through game format to discuss heart disease and the return to sexual intimacy. Provides group verbal and written material to discuss and apply goal setting through the application of the S.M.A.R.T. Method.   Other Matters of the Heart: - Provides group verbal, written materials and models to describe Stable Angina and Peripheral Artery. Includes description of the disease process and treatment options available to the cardiac patient.   Exercise & Equipment Safety: - Individual verbal instruction and demonstration of equipment use and safety with use of the equipment.   Cardiac Rehab  from 03/06/2018 in [facility] Cardiac and Pulmonary Rehab  Date  02/26/18  Educator  [facility]  Instruction Review Code  1- Verbalizes Understanding      Infection Prevention: - Provides verbal and written material to individual with discussion of infection control including proper hand washing and proper equipment cleaning during exercise session.   Cardiac Rehab from 03/06/2018 in [facility] Cardiac and Pulmonary Rehab  Date  02/26/18  Educator  [facility]  Instruction Review Code  1- Verbalizes Understanding      Falls Prevention: - Provides verbal and written material to individual with discussion of falls prevention and safety.   Cardiac Rehab from 03/06/2018 in [facility] Cardiac and Pulmonary Rehab  Date  02/26/18  Educator  mc  Instruction Review Code  1- Verbalizes Understanding      Diabetes: - Individual verbal and written instruction to review signs/symptoms of diabetes, desired ranges of glucose level fasting, after meals and with exercise. Acknowledge that pre and post exercise glucose checks will be done for 3 sessions at entry of program.   Cardiac Rehab from 03/06/2018 in [facility] Cardiac and Pulmonary Rehab  Date  02/26/18  Educator  [facility]  Instruction Review Code   1- Verbalizes Understanding      Know Your Numbers and Risk Factors: -Group verbal and written instruction about important numbers in your health.  Discussion of what are risk factors and how they play a role in the disease process.  Review of Cholesterol, Blood Pressure, Diabetes, and BMI and the role they play in your overall health.   Sleep Hygiene: -Provides group verbal and written instruction about how sleep can affect your health.  Define sleep hygiene, discuss sleep cycles and impact of sleep habits. Review good sleep hygiene tips.    Other: -Provides group and verbal instruction on various topics (see comments)   Knowledge Questionnaire Score: Knowledge Questionnaire Score - 02/26/18 1243      Knowledge Questionnaire Score   Pre Score  18/26   Reviewed correct responses with Ray today. He verbalized understanding      Core Components/Risk Factors/Patient Goals at Admission: Personal Goals and Risk Factors at Admission - 02/26/18 1235      Core Components/Risk Factors/Patient Goals on Admission    Weight Management  Yes;Weight Maintenance;Weight Gain    Intervention  Weight Management: Develop a combined nutrition and exercise program designed to reach desired caloric intake, while maintaining appropriate intake of nutrient and fiber, sodium and fats, and appropriate energy expenditure required for the weight goal.;Weight Management: Provide education and appropriate resources to help participant work on and attain dietary goals.    Admit Weight  165 lb 11.2 oz (75.2 kg)    Goal Weight: Short Term  169 lb (76.7 kg)    Goal Weight: Long Term  175 lb (79.4 kg)    Expected Outcomes  Short Term: Continue to assess and modify interventions until short term weight is achieved;Long Term: Adherence to nutrition and physical activity/exercise program aimed toward attainment of established weight goal;Weight Gain: Understanding of general recommendations for a high calorie, high  protein meal plan that promotes weight gain by distributing calorie intake throughout the day with the consumption for 4-5 meals, snacks, and/or supplements    Tobacco Cessation  Yes    Intervention  Assist the participant in steps to quit. Provide individualized education and counseling about committing to Tobacco Cessation, relapse prevention, and pharmacological support that can be provided by physician.;Advice worker, assist with locating and accessing local/national Quit Smoking  programs, and support quit date choice.    Expected Outcomes  Short Term: Will demonstrate readiness to quit, by selecting a quit date.;Short Term: Will quit all tobacco product use, adhering to prevention of relapse plan.;Long Term: Complete abstinence from all tobacco products for at least 12 months from quit date.    Heart Failure  Yes    Intervention  Provide a combined exercise and nutrition program that is supplemented with education, support and counseling about heart failure. Directed toward relieving symptoms such as shortness of breath, decreased exercise tolerance, and extremity edema.    Expected Outcomes  Improve functional capacity of life;Short term: Attendance in program 2-3 days a week with increased exercise capacity. Reported lower sodium intake. Reported increased fruit and vegetable intake. Reports medication compliance.;Short term: Daily weights obtained and reported for increase. Utilizing diuretic protocols set by physician.;Long term: Adoption of self-care skills and reduction of barriers for early signs and symptoms recognition and intervention leading to self-care maintenance.    Hypertension  Yes    Intervention  Provide education on lifestyle modifcations including regular physical activity/exercise, weight management, moderate sodium restriction and increased consumption of fresh fruit, vegetables, and low fat dairy, alcohol moderation, and smoking cessation.;Monitor prescription use  compliance.    Expected Outcomes  Short Term: Continued assessment and intervention until BP is < 140/69m HG in hypertensive participants. < 130/824mHG in hypertensive participants with diabetes, heart failure or chronic kidney disease.;Long Term: Maintenance of blood pressure at goal levels.    Lipids  Yes    Intervention  Provide education and support for participant on nutrition & aerobic/resistive exercise along with prescribed medications to achieve LDL '70mg'$ , HDL >'40mg'$ .    Expected Outcomes  Short Term: Participant states understanding of desired cholesterol values and is compliant with medications prescribed. Participant is following exercise prescription and nutrition guidelines.;Long Term: Cholesterol controlled with medications as prescribed, with individualized exercise RX and with personalized nutrition plan. Value goals: LDL < '70mg'$ , HDL > 40 mg.       Core Components/Risk Factors/Patient Goals Review:    Core Components/Risk Factors/Patient Goals at Discharge (Final Review):    ITP Comments: ITP Comments    Row Name 02/26/18 1208 02/26/18 1209 03/07/18 0811       ITP Comments  Medical review completed today. ITP sent to Dr B Caryn Sectionor review,changes as needed and signature. [facility]. ITP sent to Dr B Caryn Sectionor review,changes as needed and signature. [facility]  30 day review completed. ITP sent to Dr. BeRamonita Labcovering for Dr. MaEmily FilbertMedical Director of Cardiac Rehab. Continue with ITP unless changes are made by physician.  New to program        Comments: 30 day review

## 2018-03-09 DIAGNOSIS — R110 Nausea: Secondary | ICD-10-CM | POA: Diagnosis not present

## 2018-03-09 DIAGNOSIS — F17200 Nicotine dependence, unspecified, uncomplicated: Secondary | ICD-10-CM | POA: Diagnosis not present

## 2018-03-09 DIAGNOSIS — E440 Moderate protein-calorie malnutrition: Secondary | ICD-10-CM | POA: Diagnosis not present

## 2018-03-09 DIAGNOSIS — I2510 Atherosclerotic heart disease of native coronary artery without angina pectoris: Secondary | ICD-10-CM | POA: Diagnosis not present

## 2018-03-09 DIAGNOSIS — M545 Low back pain: Secondary | ICD-10-CM | POA: Diagnosis not present

## 2018-03-09 DIAGNOSIS — M1A9XX0 Chronic gout, unspecified, without tophus (tophi): Secondary | ICD-10-CM | POA: Diagnosis not present

## 2018-03-09 DIAGNOSIS — I739 Peripheral vascular disease, unspecified: Secondary | ICD-10-CM | POA: Diagnosis not present

## 2018-03-09 DIAGNOSIS — G8929 Other chronic pain: Secondary | ICD-10-CM | POA: Diagnosis not present

## 2018-03-09 DIAGNOSIS — I10 Essential (primary) hypertension: Secondary | ICD-10-CM | POA: Diagnosis not present

## 2018-03-09 DIAGNOSIS — J301 Allergic rhinitis due to pollen: Secondary | ICD-10-CM | POA: Diagnosis not present

## 2018-03-09 DIAGNOSIS — M109 Gout, unspecified: Secondary | ICD-10-CM | POA: Diagnosis not present

## 2018-03-13 ENCOUNTER — Encounter: Payer: PPO | Admitting: *Deleted

## 2018-03-13 DIAGNOSIS — Z951 Presence of aortocoronary bypass graft: Secondary | ICD-10-CM

## 2018-03-13 DIAGNOSIS — F17200 Nicotine dependence, unspecified, uncomplicated: Secondary | ICD-10-CM | POA: Diagnosis not present

## 2018-03-13 NOTE — Progress Notes (Signed)
Daily Session Note  Patient Details  Name: Duane Owens MRN: [id number] Date of Birth: 16-Apr-1941 Referring Provider:     Cardiac Rehab from 02/26/2018 in [facility] Cardiac and Pulmonary Rehab  Referring Provider  [name] MD [Attending: Elberta Spaniel, MD]      Encounter Date: 03/13/2018  Check In: Session Check In - 03/13/18 0820      Check-In   Supervising physician immediately available to respond to emergencies  See telemetry face sheet for immediately available ER MD    Location  [facility]-Cardiac & Pulmonary Rehab    Staff Present  Joellyn Rued, BS, Olga Coaster, RN BSN;Jessica Chili, MA, RCEP, CCRP, Exercise Physiologist;Bandy Honaker Oletta Darter, [place], ACSM CEP, Exercise Physiologist    Medication changes reported      No    Fall or balance concerns reported     No    Warm-up and Cool-down  Performed on first and last piece of equipment    Resistance Training Performed  Yes    VAD Patient?  No    PAD/SET Patient?  No      Pain Assessment   Currently in Pain?  No/denies          Social History   Tobacco Use  Smoking Status Current Every Day Smoker  . Packs/day: 0.50  . Years: 5.00  . Pack years: 2.50  . Types: Cigarettes  Smokeless Tobacco Never Used  Tobacco Comment   Has called [place] [facility] and ordered nicotene patches. Will Quit when patches start    Goals Met:  Independence with exercise equipment Exercise tolerated well No report of cardiac concerns or symptoms Strength training completed today  Goals Unmet:  Not Applicable  Comments: Pt able to follow exercise prescription today without complaint.  Will continue to monitor for progression.    Dr. Emily Filbert is Medical Director for [place] and [facility].

## 2018-03-15 DIAGNOSIS — Z951 Presence of aortocoronary bypass graft: Secondary | ICD-10-CM

## 2018-03-15 NOTE — Progress Notes (Signed)
Daily Session Note  Patient Details  Name: Duane Owens MRN: [id number] Date of Birth: [birth year] Referring Provider:     Cardiac Rehab from 02/26/2018 in [facility] Cardiac and Pulmonary Rehab  Referring Provider  [name] MD [Attending: Elberta Spaniel, MD]      Encounter Date: 03/15/2018  Check In: Session Check In - 03/15/18 0834      Check-In   Supervising physician immediately available to respond to emergencies  See telemetry face sheet for immediately available ER MD    Location  [facility]-Cardiac & Pulmonary Rehab    Staff Present  Joellyn Rued, BS, PEC;Carroll Enterkin, RN, BSN;Jessica [place], MA, RCEP, CCRP, Exercise Physiologist;Aliani Caccavale Oletta Darter, BA, ACSM CEP, Exercise Physiologist    Medication changes reported      No    Fall or balance concerns reported     No    Warm-up and Cool-down  Performed on first and last piece of equipment    Resistance Training Performed  Yes    VAD Patient?  No    PAD/SET Patient?  No      Pain Assessment   Currently in Pain?  No/denies    Multiple Pain Sites  No          Social History   Tobacco Use  Smoking Status Current Every Day Smoker  . Packs/day: 0.50  . Years: 5.00  . Pack years: 2.50  . Types: Cigarettes  Smokeless Tobacco Never Used  Tobacco Comment   Has called [place] [facility] and ordered nicotene patches. Will Quit when patches start    Goals Met:  Independence with exercise equipment Exercise tolerated well No report of cardiac concerns or symptoms Strength training completed today  Goals Unmet:  Not Applicable  Comments: Pt able to follow exercise prescription today without complaint.  Will continue to monitor for progression.    Dr. Emily Filbert is Medical Director for [name] and [facility].

## 2018-03-20 ENCOUNTER — Encounter: Payer: PPO | Admitting: *Deleted

## 2018-03-20 DIAGNOSIS — F17200 Nicotine dependence, unspecified, uncomplicated: Secondary | ICD-10-CM | POA: Diagnosis not present

## 2018-03-20 DIAGNOSIS — Z951 Presence of aortocoronary bypass graft: Secondary | ICD-10-CM

## 2018-03-20 NOTE — Progress Notes (Signed)
Daily Session Note  Patient Details  Name: Duane Owens MRN: [id number] Date of Birth: 1940-11-19 Referring Provider:     Cardiac Rehab from 02/26/2018 in [facility] Cardiac and Pulmonary Rehab  Referring Provider  [name] MD [Attending: Elberta Spaniel, MD]      Encounter Date: 03/20/2018  Check In: Session Check In - 03/20/18 0813      Check-In   Supervising physician immediately available to respond to emergencies  See telemetry face sheet for immediately available ER MD    Location  [facility]-Cardiac & Pulmonary Rehab    Staff Present  Joellyn Rued, BS, PEC;Susanne Bice, RN, BSN, CCRP;Jessica [place], MA, RCEP, CCRP, Exercise Physiologist;Ashlei Chinchilla Oletta Darter, BA, ACSM CEP, Exercise Physiologist    Medication changes reported      No    Fall or balance concerns reported     No    Warm-up and Cool-down  Performed on first and last piece of equipment    Resistance Training Performed  Yes    VAD Patient?  No    PAD/SET Patient?  No      Pain Assessment   Currently in Pain?  No/denies    Multiple Pain Sites  No          Social History   Tobacco Use  Smoking Status Current Every Day Smoker  . Packs/day: 0.50  . Years: 5.00  . Pack years: 2.50  . Types: Cigarettes  Smokeless Tobacco Never Used  Tobacco Comment   Has called [place] [facility] and ordered nicotene patches. Will Quit when patches start    Goals Met:  Independence with exercise equipment Exercise tolerated well Personal goals reviewed No report of cardiac concerns or symptoms Strength training completed today  Goals Unmet:  Not Applicable  Comments: Pt able to follow exercise prescription today without complaint.  Will continue to monitor for progression.    Dr. Emily Filbert is Medical Director for [place] and [facility].

## 2018-03-22 ENCOUNTER — Encounter: Payer: PPO | Admitting: *Deleted

## 2018-03-22 DIAGNOSIS — Z951 Presence of aortocoronary bypass graft: Secondary | ICD-10-CM

## 2018-03-22 NOTE — Progress Notes (Signed)
Daily Session Note  Patient Details  Name: Duane Owens MRN: [id number] Date of Birth: Feb 20, 1941 Referring Provider:     Cardiac Rehab from 02/26/2018 in [facility] Cardiac and Pulmonary Rehab  Referring Provider  [name] MD [Attending: Elberta Spaniel, MD]      Encounter Date: 03/22/2018  Check In: Session Check In - 03/22/18 0804      Check-In   Supervising physician immediately available to respond to emergencies  See telemetry face sheet for immediately available ER MD    Location  [facility]-Cardiac & Pulmonary Rehab    Staff Present  Joellyn Rued, BS, PEC;Carroll Enterkin, RN, BSN;Jessica [place], MA, RCEP, CCRP, Exercise Physiologist;Tiki Tucciarone Oletta Darter, BA, ACSM CEP, Exercise Physiologist    Medication changes reported      No    Fall or balance concerns reported     No    Warm-up and Cool-down  Performed on first and last piece of equipment    Resistance Training Performed  Yes    VAD Patient?  No    PAD/SET Patient?  No      Pain Assessment   Currently in Pain?  No/denies    Multiple Pain Sites  No          Social History   Tobacco Use  Smoking Status Current Every Day Smoker  . Packs/day: 0.50  . Years: 5.00  . Pack years: 2.50  . Types: Cigarettes  Smokeless Tobacco Never Used  Tobacco Comment   Has called [place] [facility] and ordered nicotene patches. Will Quit when patches start    Goals Met:  Independence with exercise equipment Exercise tolerated well No report of cardiac concerns or symptoms Strength training completed today  Goals Unmet:  Not Applicable  Comments: Pt able to follow exercise prescription today without complaint.  Will continue to monitor for progression. Reviewed home exercise with pt today.  Pt plans to walk at home, use his stationary bike and use his home gym set for exercise.  Reviewed THR, pulse, RPE, sign and symptoms, NTG use, and when to call 911 or MD.  Also discussed weather considerations and indoor options.  Pt voiced  understanding.   Short: Dr. Emily Filbert is Medical Director for [place] and [facility].

## 2018-03-27 ENCOUNTER — Encounter: Payer: PPO | Attending: Cardiothoracic Surgery

## 2018-03-27 DIAGNOSIS — H469 Unspecified optic neuritis: Secondary | ICD-10-CM | POA: Diagnosis not present

## 2018-03-27 DIAGNOSIS — H35033 Hypertensive retinopathy, bilateral: Secondary | ICD-10-CM | POA: Diagnosis not present

## 2018-03-27 DIAGNOSIS — H34211 Partial retinal artery occlusion, right eye: Secondary | ICD-10-CM | POA: Diagnosis not present

## 2018-03-27 DIAGNOSIS — H04129 Dry eye syndrome of unspecified lacrimal gland: Secondary | ICD-10-CM | POA: Diagnosis not present

## 2018-03-27 DIAGNOSIS — F17200 Nicotine dependence, unspecified, uncomplicated: Secondary | ICD-10-CM | POA: Insufficient documentation

## 2018-03-27 DIAGNOSIS — H527 Unspecified disorder of refraction: Secondary | ICD-10-CM | POA: Diagnosis not present

## 2018-03-27 DIAGNOSIS — H25813 Combined forms of age-related cataract, bilateral: Secondary | ICD-10-CM | POA: Diagnosis not present

## 2018-04-03 ENCOUNTER — Telehealth: Payer: Self-pay | Admitting: *Deleted

## 2018-04-03 ENCOUNTER — Encounter: Payer: Self-pay | Admitting: *Deleted

## 2018-04-03 DIAGNOSIS — Z951 Presence of aortocoronary bypass graft: Secondary | ICD-10-CM

## 2018-04-03 NOTE — Progress Notes (Signed)
error 

## 2018-04-03 NOTE — Progress Notes (Signed)
Discharge Progress Report  Patient Details  Name: Duane Owens MRN: [id number] Date of Birth: 1940-09-21 Referring Provider:     Cardiac Rehab from 02/26/2018 in [facility]  Referring Provider  [name] MD [Attending: Elberta Spaniel, MD]       Number of Visits:  9/36  Reason for Discharge:  Early Exit:  Back to work  Smoking History:  Social History   Tobacco Use  Smoking Status Current Every Day Smoker  . Packs/day: 0.50  . Years: 5.00  . Pack years: 2.50  . Types: Cigarettes  Smokeless Tobacco Never Used  Tobacco Comment   Has called [place] [facility] and ordered nicotene patches. Will Quit when patches start    Diagnosis:  S/P CABG x 3  ADL UCSD:   Initial Exercise Prescription: Initial Exercise Prescription - 02/26/18 1500      Date of Initial Exercise RX and Referring Provider   Date  02/26/18    Referring Provider  [name] MD   Attending: Elberta Spaniel, MD     Treadmill   MPH  2.1    Grade  0.5    Minutes  15    METs  2.75      Recumbant Bike   Level  2    RPM  50    Watts  20    Minutes  15    METs  2.8      NuStep   Level  2    SPM  80    Minutes  15    METs  2.5      Prescription Details   Frequency (times per week)  2    Duration  Progress to 30 minutes of continuous aerobic without signs/symptoms of physical distress      Intensity   THRR 40-80% of Max Heartrate  91-125    Ratings of Perceived Exertion  11-13    Perceived Dyspnea  0-4      Progression   Progression  Continue to progress workloads to maintain intensity without signs/symptoms of physical distress.      Resistance Training   Training Prescription  Yes    Weight  3 lbs    Reps  10-15       Discharge Exercise Prescription (Final Exercise Prescription Changes): Exercise Prescription Changes - 03/27/18 1500      Response to Exercise   Blood Pressure (Admit)  124/53    Blood Pressure (Exercise)  125/76    Blood Pressure  (Exit)  126/52    Heart Rate (Admit)  78 bpm    Heart Rate (Exercise)  92 bpm    Heart Rate (Exit)  73 bpm    Rating of Perceived Exertion (Exercise)  12    Symptoms  none    Duration  Continue with 30 min of aerobic exercise without signs/symptoms of physical distress.    Intensity  THRR unchanged      Progression   Progression  Continue to progress workloads to maintain intensity without signs/symptoms of physical distress.    Average METs  2.87      Resistance Training   Training Prescription  Yes    Weight  5 lbs    Reps  10-15      Interval Training   Interval Training  No      Treadmill   MPH  2.1    Grade  0.5    Minutes  15    METs  2.75  Recumbant Bike   Level  5    Watts  30    Minutes  15    METs  3.26      NuStep   Level  2    Minutes  15    METs  2.6      Home Exercise Plan   Plans to continue exercise at  Home (comment)   walks outside but does have a treadmill and stationary bike   Frequency  Add 3 additional days to program exercise sessions.    Initial Home Exercises Provided  03/22/18       Functional Capacity: 6 Minute Walk    Row Name 02/26/18 1518         6 Minute Walk   Phase  Initial     Distance  1120 feet     Walk Time  6 minutes     # of Rest Breaks  0     MPH  2.12     METS  2.82     RPE  11     VO2 Peak  9.88     Symptoms  No     Resting HR  57 bpm     Resting BP  126/74     Resting Oxygen Saturation   97 %     Exercise Oxygen Saturation  during 6 min walk  99 %     Max Ex. HR  107 bpm     Max Ex. BP  156/74     2 Minute Post BP  142/62       Interval HR   Interval Heart Rate?  Yes        Psychological, QOL, Others - Outcomes: PHQ 2/9: Depression screen [facility] 2/9 03/22/2018 02/26/2018 11/01/2016  Decreased Interest 2 1 0  Down, Depressed, Hopeless 0 0 0  PHQ - 2 Score 2 1 0  Altered sleeping 0 0 -  Tired, decreased energy 2 3 -  Change in appetite 3 3 -  Feeling bad or failure about yourself  0 0 -  Trouble  concentrating 0 0 -  Moving slowly or fidgety/restless 0 0 -  Suicidal thoughts 0 0 -  PHQ-9 Score 7 7 -  Difficult doing work/chores Somewhat difficult Not difficult at all -    Quality of Life: Quality of Life - 02/26/18 1238      Quality of Life   Select  Quality of Life      Quality of Life Scores   Health/Function Pre  22.6 %    Socioeconomic Pre  21.43 %    Psych/Spiritual Pre  28.29 %    Family Pre  24 %    GLOBAL Pre  23.74 %       Personal Goals: Goals established at orientation with interventions provided to work toward goal. Personal Goals and Risk Factors at Admission - 02/26/18 1235      Core Components/Risk Factors/Patient Goals on Admission    Weight Management  Yes;Weight Maintenance;Weight Gain    Intervention  Weight Management: Develop a combined nutrition and exercise program designed to reach desired caloric intake, while maintaining appropriate intake of nutrient and fiber, sodium and fats, and appropriate energy expenditure required for the weight goal.;Weight Management: Provide education and appropriate resources to help participant work on and attain dietary goals.    Admit Weight  165 lb 11.2 oz (75.2 kg)    Goal Weight: Short Term  169 lb (76.7 kg)  Goal Weight: Long Term  175 lb (79.4 kg)    Expected Outcomes  Short Term: Continue to assess and modify interventions until short term weight is achieved;Long Term: Adherence to nutrition and physical activity/exercise program aimed toward attainment of established weight goal;Weight Gain: Understanding of general recommendations for a high calorie, high protein meal plan that promotes weight gain by distributing calorie intake throughout the day with the consumption for 4-5 meals, snacks, and/or supplements    Tobacco Cessation  Yes    Intervention  Assist the participant in steps to quit. Provide individualized education and counseling about committing to Tobacco Cessation, relapse prevention, and  pharmacological support that can be provided by physician.;Advice worker, assist with locating and accessing local/national Quit Smoking programs, and support quit date choice.    Expected Outcomes  Short Term: Will demonstrate readiness to quit, by selecting a quit date.;Short Term: Will quit all tobacco product use, adhering to prevention of relapse plan.;Long Term: Complete abstinence from all tobacco products for at least 12 months from quit date.    Heart Failure  Yes    Intervention  Provide a combined exercise and nutrition program that is supplemented with education, support and counseling about heart failure. Directed toward relieving symptoms such as shortness of breath, decreased exercise tolerance, and extremity edema.    Expected Outcomes  Improve functional capacity of life;Short term: Attendance in program 2-3 days a week with increased exercise capacity. Reported lower sodium intake. Reported increased fruit and vegetable intake. Reports medication compliance.;Short term: Daily weights obtained and reported for increase. Utilizing diuretic protocols set by physician.;Long term: Adoption of self-care skills and reduction of barriers for early signs and symptoms recognition and intervention leading to self-care maintenance.    Hypertension  Yes    Intervention  Provide education on lifestyle modifcations including regular physical activity/exercise, weight management, moderate sodium restriction and increased consumption of fresh fruit, vegetables, and low fat dairy, alcohol moderation, and smoking cessation.;Monitor prescription use compliance.    Expected Outcomes  Short Term: Continued assessment and intervention until BP is < 140/55mm HG in hypertensive participants. < 130/64mm HG in hypertensive participants with diabetes, heart failure or chronic kidney disease.;Long Term: Maintenance of blood pressure at goal levels.    Lipids  Yes    Intervention  Provide education and  support for participant on nutrition & aerobic/resistive exercise along with prescribed medications to achieve LDL 70mg , HDL >40mg .    Expected Outcomes  Short Term: Participant states understanding of desired cholesterol values and is compliant with medications prescribed. Participant is following exercise prescription and nutrition guidelines.;Long Term: Cholesterol controlled with medications as prescribed, with individualized exercise RX and with personalized nutrition plan. Value goals: LDL < 70mg , HDL > 40 mg.        Personal Goals Discharge: Goals and Risk Factor Review    Row Name 03/20/18 0823             Core Components/Risk Factors/Patient Goals Review   Personal Goals Review  Weight Management/Obesity;Hypertension;Lipids       Review  Ray's weight is still yoyoing.  He gains some, but loses it.  He says that the heat doesn't help him gain weight.  Ray's blood pressures have been good, he checks it at home a few times a week.   He is doing well on his medications.  He has a follow up appointment next week  with his primary.         Expected Outcomes  Short: Continue  to work on weight gain.  Long: Continue to monitor risk factors.           Exercise Goals and Review: Exercise Goals    Row Name 02/26/18 1523             Exercise Goals   Increase Physical Activity  Yes       Intervention  Provide advice, education, support and counseling about physical activity/exercise needs.;Develop an individualized exercise prescription for aerobic and resistive training based on initial evaluation findings, risk stratification, comorbidities and participant's personal goals.       Expected Outcomes  Short Term: Attend rehab on a regular basis to increase amount of physical activity.;Long Term: Add in home exercise to make exercise part of routine and to increase amount of physical activity.;Long Term: Exercising regularly at least 3-5 days a week.       Increase Strength and Stamina  Yes        Intervention  Provide advice, education, support and counseling about physical activity/exercise needs.;Develop an individualized exercise prescription for aerobic and resistive training based on initial evaluation findings, risk stratification, comorbidities and participant's personal goals.       Expected Outcomes  Short Term: Increase workloads from initial exercise prescription for resistance, speed, and METs.;Long Term: Improve cardiorespiratory fitness, muscular endurance and strength as measured by increased METs and functional capacity (6MWT);Short Term: Perform resistance training exercises routinely during rehab and add in resistance training at home       Able to understand and use rate of perceived exertion (RPE) scale  Yes       Intervention  Provide education and explanation on how to use RPE scale       Expected Outcomes  Short Term: Able to use RPE daily in rehab to express subjective intensity level;Long Term:  Able to use RPE to guide intensity level when exercising independently       Able to understand and use Dyspnea scale  Yes       Intervention  Provide education and explanation on how to use Dyspnea scale       Expected Outcomes  Short Term: Able to use Dyspnea scale daily in rehab to express subjective sense of shortness of breath during exertion;Long Term: Able to use Dyspnea scale to guide intensity level when exercising independently       Knowledge and understanding of Target Heart Rate Range (THRR)  Yes       Intervention  Provide education and explanation of THRR including how the numbers were predicted and where they are located for reference       Expected Outcomes  Short Term: Able to state/look up THRR;Short Term: Able to use daily as guideline for intensity in rehab;Long Term: Able to use THRR to govern intensity when exercising independently       Able to check pulse independently  Yes       Intervention  Provide education and demonstration on how to check  pulse in carotid and radial arteries.;Review the importance of being able to check your own pulse for safety during independent exercise       Expected Outcomes  Short Term: Able to explain why pulse checking is important during independent exercise;Long Term: Able to check pulse independently and accurately       Understanding of Exercise Prescription  Yes       Intervention  Provide education, explanation, and written materials on patient's individual exercise prescription       Expected  Outcomes  Short Term: Able to explain program exercise prescription;Long Term: Able to explain home exercise prescription to exercise independently          Nutrition & Weight - Outcomes: Pre Biometrics - 02/26/18 1524      Pre Biometrics   Height  5' 11.9" (1.826 m)    Weight  165 lb 11.2 oz (75.2 kg)    Waist Circumference  33.5 inches    Hip Circumference  40 inches    Waist to Hip Ratio  0.84 %    BMI (Calculated)  22.54    Single Leg Stand  9.25 seconds        Nutrition: Nutrition Therapy & Goals - 03/06/18 0932      Nutrition Therapy   Diet  TLC    Drug/Food Interactions  Statins/Certain Fruits    Protein (specify units)  9oz    Fiber  30 grams    Whole Grain Foods  3 servings   does not always choose whole grains   Saturated Fats  16 max. grams    Fruits and Vegetables  4 servings/day   8 ideal. eats vegetables 2-3x/wk, eats fruits more regularly   Sodium  2000 grams      Personal Nutrition Goals   Nutrition Goal  Because you are only able to eat small amounts at meal times, consuming at least 1, ideally 2-3 snacks per day would help you meet your nutritional needs    Personal Goal #2  Drink shakes or nutritional drinks such as Ensure on a more regular basis; every other day if possible until you reach your 5-10# wt gain goal    Personal Goal #3  Continue to monitor sodium content in foods and to choose salt substitutes for seasoning, great job!    Comments  He is working to regain  the weight he lost in the hospital, but is only able to eat small portions d/t early satiety. He is currently eating 2 regular meals and 1 snack per day. H/o drinking Ensure but is not currently drinking them consistently. Eats vegetables only a couple of times per week      Intervention Plan   Intervention  Prescribe, educate and counsel regarding individualized specific dietary modifications aiming towards targeted core components such as weight, hypertension, lipid management, diabetes, heart failure and other comorbidities.    Expected Outcomes  Short Term Goal: Understand basic principles of dietary content, such as calories, fat, sodium, cholesterol and nutrients.;Short Term Goal: A plan has been developed with personal nutrition goals set during dietitian appointment.;Long Term Goal: Adherence to prescribed nutrition plan.       Nutrition Discharge: Nutrition Assessments - 02/26/18 1232      MEDFICTS Scores   Pre Score  21       Education Questionnaire Score: Knowledge Questionnaire Score - 02/26/18 1243      Knowledge Questionnaire Score   Pre Score  18/26   Reviewed correct responses with Ray today. He verbalized understanding      Goals reviewed with patient; copy given to patient.

## 2018-04-03 NOTE — Progress Notes (Signed)
Cardiac Individual Treatment Plan  Patient Details  Name: Duane Owens MRN: [id number] Date of Birth: 1940-10-18 Referring Provider:     Cardiac Rehab from 02/26/2018 in [facility] Cardiac and Pulmonary Rehab  Referring Provider  [name] MD [Attending: Elberta Spaniel, MD]      Initial Encounter Date:    Cardiac Rehab from 02/26/2018 in [facility] Cardiac and Pulmonary Rehab  Date  02/26/18      Visit Diagnosis: S/P CABG x 3  Patient's Home Medications on Admission:  Current Outpatient Medications:  .  allopurinol (ZYLOPRIM) 300 MG tablet, Take 300 mg by mouth daily., Disp: , Rfl:  .  aspirin 325 MG tablet, Take 325 mg by mouth daily., Disp: , Rfl:  .  atorvastatin (LIPITOR) 80 MG tablet, Take by mouth., Disp: , Rfl:  .  ELIQUIS 5 MG TABS tablet, , Disp: , Rfl:  .  hydrochlorothiazide (HYDRODIURIL) 12.5 MG tablet, Take 12.5 mg by mouth daily., Disp: , Rfl:  .  lactulose (CHRONULAC) 10 GM/15ML solution, Take 30 mLs (20 g total) by mouth 3 (three) times daily as needed for mild constipation or moderate constipation., Disp: 240 mL, Rfl: 3 .  oxyCODONE-acetaminophen (PERCOCET) 7.5-325 MG tablet, Take by mouth 2 (two) times daily., Disp: , Rfl:   Past Medical History: Past Medical History:  Diagnosis Date  . Anemia   . Atherosclerosis   . Cancer [facility])    prostate  . Hypercholesteremia   . Hyperlipemia   . Hypertension   . Spinal stenosis   . Varicose vein   . Venous insufficiency     Tobacco Use: Social History   Tobacco Use  Smoking Status Current Every Day Smoker  . Packs/day: 0.50  . Years: 5.00  . Pack years: 2.50  . Types: Cigarettes  Smokeless Tobacco Never Used  Tobacco Comment   Has called [employer] [facility] and ordered nicotene patches. Will Quit when patches start    Labs: Recent Review Flowsheet Data    There is no flowsheet data to display.       Exercise Target Goals: Exercise Program Goal: Individual exercise prescription set using results from  initial 6 min walk test and THRR while considering  patient's activity barriers and safety.   Exercise Prescription Goal: Initial exercise prescription builds to 30-45 minutes a day of aerobic activity, 2-3 days per week.  Home exercise guidelines will be given to patient during program as part of exercise prescription that the participant will acknowledge.  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 HR  107 bpm     Max Ex. BP  156/74     2 Minute Post BP  142/62       Interval HR   Interval Heart Rate?  Yes        Oxygen Initial Assessment:   Oxygen Re-Evaluation:   Oxygen Discharge (Final Oxygen Re-Evaluation):   Initial Exercise Prescription: Initial Exercise Prescription - 02/26/18 1500      Date of Initial Exercise RX and Referring Provider   Date  02/26/18    Referring Provider  [name] MD   Attending: Elberta Spaniel, MD     Treadmill   MPH  2.1    Grade  0.5    Minutes  15    METs  2.75      Recumbant Bike   Level  2    RPM  50    Watts  20    Minutes  15    METs  2.8      NuStep   Level  2    SPM  80    Minutes  15    METs  2.5      Prescription Details   Frequency (times per week)  2    Duration   Progress to 30 minutes of continuous aerobic without signs/symptoms of physical distress      Intensity   THRR 40-80% of Max Heartrate  91-125    Ratings of Perceived Exertion  11-13    Perceived Dyspnea  0-4      Progression   Progression  Continue to progress workloads to maintain intensity without signs/symptoms of physical distress.      Resistance Training   Training Prescription  Yes    Weight  3 lbs    Reps  10-15       Perform Capillary Blood Glucose checks as needed.  Exercise Prescription Changes: Exercise Prescription Changes    Row Name 02/26/18 1200 03/13/18 1500 03/22/18 0800 03/27/18 1500       Response to Exercise   Blood Pressure (Admit)  126/74  132/60  -  124/53    Blood Pressure (Exercise)  156/74  144/72  -  125/76    Blood Pressure (Exit)  142/62  132/60  -  126/52    Heart Rate (Admit)  57 bpm  69 bpm  -  78 bpm    Heart Rate (Exercise)  107 bpm  86 bpm  -  92 bpm    Heart Rate (Exit)  64 bpm  76 bpm  -  73 bpm    Oxygen Saturation (Admit)  97 %  -  -  -    Oxygen Saturation (Exercise)  99 %  -  -  -    Rating of Perceived Exertion (Exercise)  11  13  -  12    Symptoms  none  none  -  none    Comments  walk test results  -  -  -    Duration  -  Continue with 30 min of aerobic exercise without signs/symptoms of physical distress.  -  Continue with 30 min of aerobic exercise without signs/symptoms of physical distress.    Intensity  -  THRR unchanged  -  THRR unchanged  Progression   Progression  -  Continue to progress workloads to maintain intensity without signs/symptoms of physical distress.  -  Continue to progress workloads to maintain intensity without signs/symptoms of physical distress.    Average METs  -  2.63  -  2.87      Resistance Training   Training Prescription  -  Yes  -  Yes    Weight  -  5 lbs  -  5 lbs    Reps  -  10-15  -  10-15      Interval Training   Interval Training  -  No  -  No      Treadmill   MPH  -  2.1  -   2.1    Grade  -  0.5  -  0.5    Minutes  -  15  -  15    METs  -  2.75  -  2.75      Recumbant Bike   Level  -  2  -  5    Watts  -  -  -  30    Minutes  -  15  -  15    METs  -  2.63  -  3.26      NuStep   Level  -  2  -  2    Minutes  -  15  -  15    METs  -  2.5  -  2.6      Home Exercise Plan   Plans to continue exercise at  -  -  Home (comment) walks outside but does have a treadmill and stationary bike  Home (comment) walks outside but does have a treadmill and stationary bike    Frequency  -  -  Add 3 additional days to program exercise sessions.  Add 3 additional days to program exercise sessions.    Initial Home Exercises Provided  -  -  03/22/18  03/22/18       Exercise Comments: Exercise Comments    Row Name 03/01/18 [id number] 03/22/18 0177         Exercise Comments   First full day of exercise!  Patient was oriented to gym and equipment including functions, settings, policies, and procedures.  Patient's individual exercise prescription and treatment plan were reviewed.  All starting workloads were established based on the results of the 6 minute walk test done at initial orientation visit.  The plan for exercise progression was also introduced and progression will be customized based on patient's performance and goals.  Reviewed home exercise with pt today.  Pt plans to walk at home, use his stationary bike and use his home gym set for exercise.  Reviewed THR, pulse, RPE, sign and symptoms, NTG use, and when to call 911 or MD.  Also discussed weather considerations and indoor options.  Pt voiced understanding.         Exercise Goals and Review: Exercise Goals    Row Name 02/26/18 1523             Exercise Goals   Increase Physical Activity  Yes       Intervention  Provide advice, education, support and counseling about physical activity/exercise needs.;Develop an individualized exercise prescription for aerobic and resistive training based on initial evaluation  findings, risk stratification, comorbidities and participant's personal goals.       Expected Outcomes  Short Term: Attend rehab on a regular  basis to increase amount of physical activity.;Long Term: Add in home exercise to make exercise part of routine and to increase amount of physical activity.;Long Term: Exercising regularly at least 3-5 days a week.       Increase Strength and Stamina  Yes       Intervention  Provide advice, education, support and counseling about physical activity/exercise needs.;Develop an individualized exercise prescription for aerobic and resistive training based on initial evaluation findings, risk stratification, comorbidities and participant's personal goals.       Expected Outcomes  Short Term: Increase workloads from initial exercise prescription for resistance, speed, and METs.;Long Term: Improve cardiorespiratory fitness, muscular endurance and strength as measured by increased METs and functional capacity (6MWT);Short Term: Perform resistance training exercises routinely during rehab and add in resistance training at home       Able to understand and use rate of perceived exertion (RPE) scale  Yes       Intervention  Provide education and explanation on how to use RPE scale       Expected Outcomes  Short Term: Able to use RPE daily in rehab to express subjective intensity level;Long Term:  Able to use RPE to guide intensity level when exercising independently       Able to understand and use Dyspnea scale  Yes       Intervention  Provide education and explanation on how to use Dyspnea scale       Expected Outcomes  Short Term: Able to use Dyspnea scale daily in rehab to express subjective sense of shortness of breath during exertion;Long Term: Able to use Dyspnea scale to guide intensity level when exercising independently       Knowledge and understanding of Target Heart Rate Range (THRR)  Yes       Intervention  Provide education and explanation of THRR including how  the numbers were predicted and where they are located for reference       Expected Outcomes  Short Term: Able to state/look up THRR;Short Term: Able to use daily as guideline for intensity in rehab;Long Term: Able to use THRR to govern intensity when exercising independently       Able to check pulse independently  Yes       Intervention  Provide education and demonstration on how to check pulse in carotid and radial arteries.;Review the importance of being able to check your own pulse for safety during independent exercise       Expected Outcomes  Short Term: Able to explain why pulse checking is important during independent exercise;Long Term: Able to check pulse independently and accurately       Understanding of Exercise Prescription  Yes       Intervention  Provide education, explanation, and written materials on patient's individual exercise prescription       Expected Outcomes  Short Term: Able to explain program exercise prescription;Long Term: Able to explain home exercise prescription to exercise independently          Exercise Goals Re-Evaluation : Exercise Goals Re-Evaluation    Row Name 03/01/18 0916 03/13/18 1520 03/20/18 0817 03/22/18 0823 03/27/18 1544     Exercise Goal Re-Evaluation   Exercise Goals Review  Increase Physical Activity;Increase Strength and Stamina;Able to understand and use rate of perceived exertion (RPE) scale;Knowledge and understanding of Target Heart Rate Range (THRR);Understanding of Exercise Prescription  Increase Physical Activity;Increase Strength and Stamina;Understanding of Exercise Prescription  Increase Physical Activity;Increase Strength and Stamina;Understanding of Exercise Prescription  Increase Physical Activity;Increase Strength and Stamina;Understanding of Exercise Prescription  Increase Physical Activity;Increase Strength and Stamina;Understanding of Exercise Prescription   Comments  Reviewed RPE scale, THR and program prescription with pt today.   Pt voiced understanding and was given a copy of goals to take home.   Duane Owens is off to a good start in rehab.  He has completed three full days of exercise.  We will continue to monitor his progression.   Duane Owens is doing well in rehab. He has been using his gym for weights at home and walking a mile a few days a week.  We talked about increasing his time for walking to build to two miles.  He is starting to feel stronger and has more stamina.   Reviewed home exercise with pt today.  Pt plans to walk at home, use his stationary bike and use his home gym set for exercise.  Reviewed THR, pulse, RPE, sign and symptoms, NTG use, and when to call 911 or MD.  Also discussed weather considerations and indoor options.  Pt voiced understanding.  Duane Owens has been doing well in rehab.  Duane Owens has moved up to 30 watts on the recumbent bike on level 5.  We will continue to monitor his progress.    Expected Outcomes  Short: Use RPE daily to regulate intensity. Long: Follow program prescription in THR.  Short: Review home exercise guidelines.  Long: Continue to follow program prescription.   Short: Add in more time walking to build to two miles. Long: Continue to exercise at home on off days to build strength back up.   Short: add 3 addtional days of exercise at home Long: increase overall activity level   Short: Continue to add in exercise at home.  Long: Continue to increase strength and stamina.       Discharge Exercise Prescription (Final Exercise Prescription Changes): Exercise Prescription Changes - 03/27/18 1500      Response to Exercise   Blood Pressure (Admit)  124/53    Blood Pressure (Exercise)  125/76    Blood Pressure (Exit)  126/52    Heart Rate (Admit)  78 bpm    Heart Rate (Exercise)  92 bpm    Heart Rate (Exit)  73 bpm    Rating of Perceived Exertion (Exercise)  12    Symptoms  none    Duration  Continue with 30 min of aerobic exercise without signs/symptoms of physical distress.    Intensity  THRR unchanged       Progression   Progression  Continue to progress workloads to maintain intensity without signs/symptoms of physical distress.    Average METs  2.87      Resistance Training   Training Prescription  Yes    Weight  5 lbs    Reps  10-15      Interval Training   Interval Training  No      Treadmill   MPH  2.1    Grade  0.5    Minutes  15    METs  2.75      Recumbant Bike   Level  5    Watts  30    Minutes  15    METs  3.26      NuStep   Level  2    Minutes  15    METs  2.6      Home Exercise Plan   Plans to continue exercise at  Home (comment)   walks outside but does  have a treadmill and stationary bike   Frequency  Add 3 additional days to program exercise sessions.    Initial Home Exercises Provided  03/22/18       Nutrition:  Target Goals: Understanding of nutrition guidelines, daily intake of sodium <1556m, cholesterol <2042m calories 30% from fat and 7% or less from saturated fats, daily to have 5 or more servings of fruits and vegetables.  Biometrics: Pre Biometrics - 02/26/18 1524      Pre Biometrics   Height  5' 11.9" (1.826 m)    Weight  165 lb 11.2 oz (75.2 kg)    Waist Circumference  33.5 inches    Hip Circumference  40 inches    Waist to Hip Ratio  0.84 %    BMI (Calculated)  22.54    Single Leg Stand  9.25 seconds        Nutrition Therapy Plan and Nutrition Goals: Nutrition Therapy & Goals - 03/06/18 0932      Nutrition Therapy   Diet  TLC    Drug/Food Interactions  Statins/Certain Fruits    Protein (specify units)  9oz    Fiber  30 grams    Whole Grain Foods  3 servings   does not always choose whole grains   Saturated Fats  16 max. grams    Fruits and Vegetables  4 servings/day   8 ideal. eats vegetables 2-3x/wk, eats fruits more regularly   Sodium  2000 grams      Personal Nutrition Goals   Nutrition Goal  Because you are only able to eat small amounts at meal times, consuming at least 1, ideally 2-3 snacks per day would help  you meet your nutritional needs    Personal Goal #2  Drink shakes or nutritional drinks such as Ensure on a more regular basis; every other day if possible until you reach your 5-10# wt gain goal    Personal Goal #3  Continue to monitor sodium content in foods and to choose salt substitutes for seasoning, great job!    Comments  He is working to regain the weight he lost in the hospital, but is only able to eat small portions d/t early satiety. He is currently eating 2 regular meals and 1 snack per day. H/o drinking Ensure but is not currently drinking them consistently. Eats vegetables only a couple of times per week      Intervention Plan   Intervention  Prescribe, educate and counsel regarding individualized specific dietary modifications aiming towards targeted core components such as weight, hypertension, lipid management, diabetes, heart failure and other comorbidities.    Expected Outcomes  Short Term Goal: Understand basic principles of dietary content, such as calories, fat, sodium, cholesterol and nutrients.;Short Term Goal: A plan has been developed with personal nutrition goals set during dietitian appointment.;Long Term Goal: Adherence to prescribed nutrition plan.       Nutrition Assessments: Nutrition Assessments - 02/26/18 1232      MEDFICTS Scores   Pre Score  21       Nutrition Goals Re-Evaluation: Nutrition Goals Re-Evaluation    Row Name 03/06/18 0939 03/20/18 [id number]         Goals   Nutrition Goal  Because you are only able to eat small amounts at meal times, consuming at least 1, ideally 2-3 snacks per day would help you meet your nutritional needs  Increase snacks, Nutritional shakes, watch sodium      Comment  He c/o early satiety at  meal times though desires weight gain. Current eating pattern and portion sizes are typically not sufficient to promote weight gain  Duane Owens has increased uto 3-4 snacks a day.  He has added in the Ensure shakes, but is currently out of  them.  He is not reading food lables, but he has gotten away from adding in salt.  He is still working on gaining weight.       Expected Outcome  He will increase his daily caloric intake through addtional snacks, or nutritional shakes if needed  Short: Continue to increase calorie intake.  Long: Continue to use the shakes for more calories.         Personal Goal #2 Re-Evaluation   Personal Goal #2  Drink shakes or nutritional drinks such as Ensure on a more regular basis; every other day if possible until you reach your 5-10# wt gain goal  -        Personal Goal #3 Re-Evaluation   Personal Goal #3  Continue to montior sodium content in foods and to choose salt substitutes for seasoning, great job!  -         Nutrition Goals Discharge (Final Nutrition Goals Re-Evaluation): Nutrition Goals Re-Evaluation - 03/20/18 9381      Goals   Nutrition Goal  Increase snacks, Nutritional shakes, watch sodium    Comment  Duane Owens has increased uto 3-4 snacks a day.  He has added in the Ensure shakes, but is currently out of them.  He is not reading food lables, but he has gotten away from adding in salt.  He is still working on gaining weight.     Expected Outcome  Short: Continue to increase calorie intake.  Long: Continue to use the shakes for more calories.        Psychosocial: Target Goals: Acknowledge presence or absence of significant depression and/or stress, maximize coping skills, provide positive support system. Participant is able to verbalize types and ability to use techniques and skills needed for reducing stress and depression.   Initial Review & Psychosocial Screening: Initial Psych Review & Screening - 02/26/18 1238      Initial Review   Current issues with  None Identified      Family Dynamics   Good Support System?  Yes   daughters     Barriers   Psychosocial barriers to participate in program  The patient should benefit from training in stress management and relaxation.;There are  no identifiable barriers or psychosocial needs.      Screening Interventions   Interventions  Encouraged to exercise;To provide support and resources with identified psychosocial needs;Provide feedback about the scores to participant    Expected Outcomes  Short Term goal: Utilizing psychosocial counselor, staff and physician to assist with identification of specific Stressors or current issues interfering with healing process. Setting desired goal for each stressor or current issue identified.;Long Term Goal: Stressors or current issues are controlled or eliminated.;Short Term goal: Identification and review with participant of any Quality of Life or Depression concerns found by scoring the questionnaire.;Long Term goal: The participant improves quality of Life and PHQ9 Scores as seen by post scores and/or verbalization of changes       Quality of Life Scores:  Quality of Life - 02/26/18 1238      Quality of Life   Select  Quality of Life      Quality of Life Scores   Health/Function Pre  22.6 %    Socioeconomic Pre  21.43 %  Psych/Spiritual Pre  28.29 %    Family Pre  24 %    GLOBAL Pre  23.74 %      Scores of 19 and below usually indicate a poorer quality of life in these areas.  A difference of  2-3 points is a clinically meaningful difference.  A difference of 2-3 points in the total score of the Quality of Life Index has been associated with significant improvement in overall quality of life, self-image, physical symptoms, and general health in studies assessing change in quality of life.  PHQ-9: Recent Review Flowsheet Data    Depression screen [facility] 2/9 03/22/2018 02/26/2018 11/01/2016   Decreased Interest 2 1 0   Down, Depressed, Hopeless 0 0 0   PHQ - 2 Score 2 1 0   Altered sleeping 0 0 -   Tired, decreased energy 2 3 -   Change in appetite 3 3 -   Feeling bad or failure about yourself  0 0 -   Trouble concentrating 0 0 -   Moving slowly or fidgety/restless 0 0 -   Suicidal  thoughts 0 0 -   PHQ-9 Score 7 7 -   Difficult doing work/chores Somewhat difficult Not difficult at all -     Interpretation of Total Score  Total Score Depression Severity:  1-4 = Minimal depression, 5-9 = Mild depression, 10-14 = Moderate depression, 15-19 = Moderately severe depression, 20-27 = Severe depression   Psychosocial Evaluation and Intervention: Psychosocial Evaluation - 03/22/18 0949      Psychosocial Evaluation & Interventions   Interventions  Relaxation education;Stress management education;Encouraged to exercise with the program and follow exercise prescription    Comments  Counselor met with Mr. Boehlke (Duane Owens) today for initial psychosocial evaluation.  He is a 77 year old who had a heart attack and CABGx3 procedure several months ago.  He also had a stroke during the surgery and has recovered mostly from that - with the exception of some vision problems.  Duane Owens has a strong support system with a daughter and older grandchildren who live in the same home with him; and many friends and church "family" who are all actively involved with him.  His spouse of 5 years passed away in 09/12/09.  Duane Owens states he sleeps well and has a "fair" appetite.  He denies a history of depression or anxiety or any current symptoms and is typically in a positive mood.  Duane Owens says his health and finances are his primary stressors currently.  He has goals to increase his stamina and strength and be able to get back to his part time job in the near future.  Staff will follow with Duane Owens.    Expected Outcomes  Short:  Duane Owens will exercise for his health and for stress management.  He will attend the psychoeducational components of this program to learn positive ways to cope with stress in his life.   Long:   Duane Owens will maintain a positive and healthy lifestyle of exercise; stress management and diet.      Continue Psychosocial Services   Follow up required by staff       Psychosocial Re-Evaluation: Psychosocial  Re-Evaluation    [place] Name 03/20/18 [phone]             Psychosocial Re-Evaluation   Current issues with  None Identified       Comments  Duane Owens is doing well in rehab.  He has limited stress, his health is his biggest issue.  He sleeps  well at home.  He has a strong support system in his daughter and granddaughter.        Expected Outcomes  Short: Continue to stay positive.  Long: Continue to exercise.        Interventions  Encouraged to attend Cardiac Rehabilitation for the exercise       Continue Psychosocial Services   Follow up required by counselor          Psychosocial Discharge (Final Psychosocial Re-Evaluation): Psychosocial Re-Evaluation - 03/20/18 0819      Psychosocial Re-Evaluation   Current issues with  None Identified    Comments  Duane Owens is doing well in rehab.  He has limited stress, his health is his biggest issue.  He sleeps well at home.  He has a strong support system in his daughter and granddaughter.     Expected Outcomes  Short: Continue to stay positive.  Long: Continue to exercise.     Interventions  Encouraged to attend Cardiac Rehabilitation for the exercise    Continue Psychosocial Services   Follow up required by counselor       Vocational Rehabilitation: Provide vocational rehab assistance to qualifying candidates.   Vocational Rehab Evaluation & Intervention: Vocational Rehab - 02/26/18 1244      Initial Vocational Rehab Evaluation & Intervention   Assessment shows need for Vocational Rehabilitation  No       Education: Education Goals: Education classes will be provided on a variety of topics geared toward better understanding of heart health and risk factor modification. Participant will state understanding/return demonstration of topics presented as noted by education test scores.  Learning Barriers/Preferences: Learning Barriers/Preferences - 02/26/18 1243      Learning Barriers/Preferences   Learning Barriers  Hearing    Learning Preferences   None       Education Topics:  AED/CPR: - Group verbal and written instruction with the use of models to demonstrate the basic use of the AED with the basic ABC's of resuscitation.   General Nutrition Guidelines/Fats and Fiber: -Group instruction provided by verbal, written material, models and posters to present the general guidelines for heart healthy nutrition. Gives an explanation and review of dietary fats and fiber.   Controlling Sodium/Reading Food Labels: -Group verbal and written material supporting the discussion of sodium use in heart healthy nutrition. Review and explanation with models, verbal and written materials for utilization of the food label.   Exercise Physiology & General Exercise Guidelines: - Group verbal and written instruction with models to review the exercise physiology of the cardiovascular system and associated critical values. Provides general exercise guidelines with specific guidelines to those with heart or lung disease.    Aerobic Exercise & Resistance Training: - Gives group verbal and written instruction on the various components of exercise. Focuses on aerobic and resistive training programs and the benefits of this training and how to safely progress through these programs..   Cardiac Rehab from 03/22/2018 in [facility] Cardiac and Pulmonary Rehab  Date  03/06/18  Educator  AS  Instruction Review Code  1- Verbalizes Understanding      Flexibility, Balance, Mind/Body Relaxation: Provides group verbal/written instruction on the benefits of flexibility and balance training, including mind/body exercise modes such as yoga, pilates and tai chi.  Demonstration and skill practice provided.   Stress and Anxiety: - Provides group verbal and written instruction about the health risks of elevated stress and causes of high stress.  Discuss the correlation between heart/lung disease and anxiety and treatment  options. Review healthy ways to manage with stress and  anxiety.   Cardiac Rehab from 03/22/2018 in [facility] Cardiac and Pulmonary Rehab  Date  03/13/18  Educator  [facility]  Instruction Review Code  1- Verbalizes Understanding      Depression: - Provides group verbal and written instruction on the correlation between heart/lung disease and depressed mood, treatment options, and the stigmas associated with seeking treatment.   Anatomy & Physiology of the Heart: - Group verbal and written instruction and models provide basic cardiac anatomy and physiology, with the coronary electrical and arterial systems. Review of Valvular disease and Heart Failure   Cardiac Rehab from 03/22/2018 in [facility] Cardiac and Pulmonary Rehab  Date  03/15/18  Educator  CE  Instruction Review Code  1- Verbalizes Understanding      Cardiac Procedures: - Group verbal and written instruction to review commonly prescribed medications for heart disease. Reviews the medication, class of the drug, and side effects. Includes the steps to properly store meds and maintain the prescription regimen. (beta blockers and nitrates)   Cardiac Medications I: - Group verbal and written instruction to review commonly prescribed medications for heart disease. Reviews the medication, class of the drug, and side effects. Includes the steps to properly store meds and maintain the prescription regimen.   Cardiac Rehab from 03/22/2018 in [facility] Cardiac and Pulmonary Rehab  Date  03/20/18  Educator  SB  Instruction Review Code  1- Verbalizes Understanding      Cardiac Medications II: -Group verbal and written instruction to review commonly prescribed medications for heart disease. Reviews the medication, class of the drug, and side effects. (all other drug classes)    Go Sex-Intimacy & Heart Disease, Get SMART - Goal Setting: - Group verbal and written instruction through game format to discuss heart disease and the return to sexual intimacy. Provides group verbal and written material to discuss and  apply goal setting through the application of the S.M.A.R.T. Method.   Other Matters of the Heart: - Provides group verbal, written materials and models to describe Stable Angina and Peripheral Artery. Includes description of the disease process and treatment options available to the cardiac patient.   Cardiac Rehab from 03/22/2018 in [facility] Cardiac and Pulmonary Rehab  Date  03/15/18  Educator  CE  Instruction Review Code  1- Verbalizes Understanding      Exercise & Equipment Safety: - Individual verbal instruction and demonstration of equipment use and safety with use of the equipment.   Cardiac Rehab from 03/22/2018 in [facility] Cardiac and Pulmonary Rehab  Date  02/26/18  Educator  [facility]  Instruction Review Code  1- Verbalizes Understanding      Infection Prevention: - Provides verbal and written material to individual with discussion of infection control including proper hand washing and proper equipment cleaning during exercise session.   Cardiac Rehab from 03/22/2018 in [facility] Cardiac and Pulmonary Rehab  Date  02/26/18  Educator  mc  Instruction Review Code  1- Verbalizes Understanding      Falls Prevention: - Provides verbal and written material to individual with discussion of falls prevention and safety.   Cardiac Rehab from 03/22/2018 in [facility] Cardiac and Pulmonary Rehab  Date  02/26/18  Educator  mc  Instruction Review Code  1- Verbalizes Understanding      Diabetes: - Individual verbal and written instruction to review signs/symptoms of diabetes, desired ranges of glucose level fasting, after meals and with exercise. Acknowledge that pre and post exercise glucose checks will  be done for 3 sessions at entry of program.   Cardiac Rehab from 03/22/2018 in [facility] Cardiac and Pulmonary Rehab  Date  02/26/18  Educator  SB  Instruction Review Code  1- Verbalizes Understanding      Know Your Numbers and Risk Factors: -Group verbal and written instruction about important numbers in  your health.  Discussion of what are risk factors and how they play a role in the disease process.  Review of Cholesterol, Blood Pressure, Diabetes, and BMI and the role they play in your overall health.   Sleep Hygiene: -Provides group verbal and written instruction about how sleep can affect your health.  Define sleep hygiene, discuss sleep cycles and impact of sleep habits. Review good sleep hygiene tips.    Other: -Provides group and verbal instruction on various topics (see comments)   Knowledge Questionnaire Score: Knowledge Questionnaire Score - 02/26/18 1243      Knowledge Questionnaire Score   Pre Score  18/26   Reviewed correct responses with Duane Owens today. He verbalized understanding      Core Components/Risk Factors/Patient Goals at Admission: Personal Goals and Risk Factors at Admission - 02/26/18 1235      Core Components/Risk Factors/Patient Goals on Admission    Weight Management  Yes;Weight Maintenance;Weight Gain    Intervention  Weight Management: Develop a combined nutrition and exercise program designed to reach desired caloric intake, while maintaining appropriate intake of nutrient and fiber, sodium and fats, and appropriate energy expenditure required for the weight goal.;Weight Management: Provide education and appropriate resources to help participant work on and attain dietary goals.    Admit Weight  165 lb 11.2 oz (75.2 kg)    Goal Weight: Short Term  169 lb (76.7 kg)    Goal Weight: Long Term  175 lb (79.4 kg)    Expected Outcomes  Short Term: Continue to assess and modify interventions until short term weight is achieved;Long Term: Adherence to nutrition and physical activity/exercise program aimed toward attainment of established weight goal;Weight Gain: Understanding of general recommendations for a high calorie, high protein meal plan that promotes weight gain by distributing calorie intake throughout the day with the consumption for 4-5 meals, snacks, and/or  supplements    Tobacco Cessation  Yes    Intervention  Assist the participant in steps to quit. Provide individualized education and counseling about committing to Tobacco Cessation, relapse prevention, and pharmacological support that can be provided by physician.;Advice worker, assist with locating and accessing local/national Quit Smoking programs, and support quit date choice.    Expected Outcomes  Short Term: Will demonstrate readiness to quit, by selecting a quit date.;Short Term: Will quit all tobacco product use, adhering to prevention of relapse plan.;Long Term: Complete abstinence from all tobacco products for at least 12 months from quit date.    Heart Failure  Yes    Intervention  Provide a combined exercise and nutrition program that is supplemented with education, support and counseling about heart failure. Directed toward relieving symptoms such as shortness of breath, decreased exercise tolerance, and extremity edema.    Expected Outcomes  Improve functional capacity of life;Short term: Attendance in program 2-3 days a week with increased exercise capacity. Reported lower sodium intake. Reported increased fruit and vegetable intake. Reports medication compliance.;Short term: Daily weights obtained and reported for increase. Utilizing diuretic protocols set by physician.;Long term: Adoption of self-care skills and reduction of barriers for early signs and symptoms recognition and intervention leading to self-care maintenance.  Hypertension  Yes    Intervention  Provide education on lifestyle modifcations including regular physical activity/exercise, weight management, moderate sodium restriction and increased consumption of fresh fruit, vegetables, and low fat dairy, alcohol moderation, and smoking cessation.;Monitor prescription use compliance.    Expected Outcomes  Short Term: Continued assessment and intervention until BP is < 140/73m HG in hypertensive participants. <  130/843mHG in hypertensive participants with diabetes, heart failure or chronic kidney disease.;Long Term: Maintenance of blood pressure at goal levels.    Lipids  Yes    Intervention  Provide education and support for participant on nutrition & aerobic/resistive exercise along with prescribed medications to achieve LDL <7085mHDL >43m33m  Expected Outcomes  Short Term: Participant states understanding of desired cholesterol values and is compliant with medications prescribed. Participant is following exercise prescription and nutrition guidelines.;Long Term: Cholesterol controlled with medications as prescribed, with individualized exercise RX and with personalized nutrition plan. Value goals: LDL < 70mg72mL > 40 mg.       Core Components/Risk Factors/Patient Goals Review:  Goals and Risk Factor Review    Row Name 03/20/18 0823 [id number]        Core Components/Risk Factors/Patient Goals Review   Personal Goals Review  Weight Management/Obesity;Hypertension;Lipids       Review  Duane Owens's weight is still yoyoing.  He gains some, but loses it.  He says that the heat doesn't help him gain weight.  Duane Owens's blood pressures have been good, he checks it at home a few times a week.   He is doing well on his medications.  He has a follow up appointment next week  with his primary.         Expected Outcomes  Short: Continue to work on weight gain.  Long: Continue to monitor risk factors.           Core Components/Risk Factors/Patient Goals at Discharge (Final Review):  Goals and Risk Factor Review - 03/20/18 0823      Core Components/Risk Factors/Patient Goals Review   Personal Goals Review  Weight Management/Obesity;Hypertension;Lipids    Review  Duane Owens's weight is still yoyoing.  He gains some, but loses it.  He says that the heat doesn't help him gain weight.  Duane Owens's blood pressures have been good, he checks it at home a few times a week.   He is doing well on his medications.  He has a follow up appointment  next week  with his primary.      Expected Outcomes  Short: Continue to work on weight gain.  Long: Continue to monitor risk factors.        ITP Comments: ITP Comments    Row Name 02/26/18 1208 02/26/18 1209 03/07/18 0811 04/03/18 1513     ITP Comments  Medical review completed today. ITP sent to Dr B KleCaryn Sectionreview,changes as needed and signature. [facility] ITP sent to Dr B KleCaryn Sectionreview,changes as needed and signature. Documentation of diagnosis can be found in [facility]/2019  30 day review completed. ITP sent to Dr. Bert Ramonita Labering for Dr. Mark Emily Filbertical Director of Cardiac Rehab. Continue with ITP unless changes are made by physician.  New to program  Called to check on pt.  Duane Owens has returned to work and would like to be discharged at this time. We will send off his discharge ITP for review and signature.  Comments: Discharge ITP

## 2018-04-03 NOTE — Telephone Encounter (Signed)
Called to check on pt.  Duane Owens has returned to work and would like to be discharged at this time. We will send off his discharge ITP for review and signature.

## 2018-04-06 DIAGNOSIS — F17200 Nicotine dependence, unspecified, uncomplicated: Secondary | ICD-10-CM | POA: Diagnosis not present

## 2018-04-06 DIAGNOSIS — M109 Gout, unspecified: Secondary | ICD-10-CM | POA: Diagnosis not present

## 2018-04-06 DIAGNOSIS — M545 Low back pain: Secondary | ICD-10-CM | POA: Diagnosis not present

## 2018-04-06 DIAGNOSIS — E440 Moderate protein-calorie malnutrition: Secondary | ICD-10-CM | POA: Diagnosis not present

## 2018-04-06 DIAGNOSIS — E785 Hyperlipidemia, unspecified: Secondary | ICD-10-CM | POA: Diagnosis not present

## 2018-04-06 DIAGNOSIS — G8929 Other chronic pain: Secondary | ICD-10-CM | POA: Diagnosis not present

## 2018-04-06 DIAGNOSIS — M1A9XX0 Chronic gout, unspecified, without tophus (tophi): Secondary | ICD-10-CM | POA: Diagnosis not present

## 2018-04-06 DIAGNOSIS — I2510 Atherosclerotic heart disease of native coronary artery without angina pectoris: Secondary | ICD-10-CM | POA: Diagnosis not present

## 2018-04-06 DIAGNOSIS — I739 Peripheral vascular disease, unspecified: Secondary | ICD-10-CM | POA: Diagnosis not present

## 2018-04-06 DIAGNOSIS — I10 Essential (primary) hypertension: Secondary | ICD-10-CM | POA: Diagnosis not present

## 2018-04-10 ENCOUNTER — Other Ambulatory Visit: Payer: Self-pay

## 2018-04-10 DIAGNOSIS — I739 Peripheral vascular disease, unspecified: Secondary | ICD-10-CM

## 2018-04-24 DIAGNOSIS — I739 Peripheral vascular disease, unspecified: Secondary | ICD-10-CM | POA: Diagnosis not present

## 2018-05-04 DIAGNOSIS — I739 Peripheral vascular disease, unspecified: Secondary | ICD-10-CM | POA: Diagnosis not present

## 2018-05-04 DIAGNOSIS — M1A9XX0 Chronic gout, unspecified, without tophus (tophi): Secondary | ICD-10-CM | POA: Diagnosis not present

## 2018-05-04 DIAGNOSIS — M545 Low back pain: Secondary | ICD-10-CM | POA: Diagnosis not present

## 2018-05-04 DIAGNOSIS — F17200 Nicotine dependence, unspecified, uncomplicated: Secondary | ICD-10-CM | POA: Diagnosis not present

## 2018-05-04 DIAGNOSIS — N189 Chronic kidney disease, unspecified: Secondary | ICD-10-CM | POA: Diagnosis not present

## 2018-05-04 DIAGNOSIS — I10 Essential (primary) hypertension: Secondary | ICD-10-CM | POA: Diagnosis not present

## 2018-05-04 DIAGNOSIS — G8929 Other chronic pain: Secondary | ICD-10-CM | POA: Diagnosis not present

## 2018-05-04 DIAGNOSIS — E440 Moderate protein-calorie malnutrition: Secondary | ICD-10-CM | POA: Diagnosis not present

## 2018-05-04 DIAGNOSIS — M109 Gout, unspecified: Secondary | ICD-10-CM | POA: Diagnosis not present

## 2018-05-10 DIAGNOSIS — R1030 Lower abdominal pain, unspecified: Secondary | ICD-10-CM | POA: Diagnosis not present

## 2018-05-10 DIAGNOSIS — M545 Low back pain: Secondary | ICD-10-CM | POA: Diagnosis not present

## 2018-05-10 DIAGNOSIS — M479 Spondylosis, unspecified: Secondary | ICD-10-CM | POA: Diagnosis not present

## 2018-05-18 ENCOUNTER — Other Ambulatory Visit: Payer: Self-pay

## 2018-05-18 ENCOUNTER — Ambulatory Visit (INDEPENDENT_AMBULATORY_CARE_PROVIDER_SITE_OTHER): Payer: PPO | Admitting: Vascular Surgery

## 2018-05-18 ENCOUNTER — Ambulatory Visit (HOSPITAL_COMMUNITY)
Admission: RE | Admit: 2018-05-18 | Discharge: 2018-05-18 | Disposition: A | Discharge status: Home / Self Care | Payer: PPO | Source: Ambulatory Visit | Attending: Vascular Surgery | Admitting: Vascular Surgery

## 2018-05-18 ENCOUNTER — Encounter: Payer: Self-pay | Admitting: Vascular Surgery

## 2018-05-18 VITALS — BP 108/60 | HR 66 | Resp 18 | Ht 71.9 in | Wt 166.0 lb

## 2018-05-18 DIAGNOSIS — I739 Peripheral vascular disease, unspecified: Secondary | ICD-10-CM

## 2018-05-18 NOTE — Progress Notes (Signed)
Patient ID: Duane Owens, male   DOB: [birth year], 77 y.o.   MRN: [id number]  Reason for Consult: New Patient (Initial Visit) (eval PAD - Dr. Glenna Durand)   Referred by Duane Marble, MD  Subjective:     HPI:  Duane Owens is a 77 y.o. male with a history of right SFA stenting after arthrectomy this was done for right lower extremity claudication 2017.  He continues to have pain in his bilateral groins radiating down his medial leg.  He recently saw a neurologist who recommended possible back injections but to have his vascular work-up first.  He takes aspirin daily does take Eliquis as well and he is also on a statin drug.Marland Kitchen  Has a history of coronary artery disease as well as congestive heart failure.  Does not have any rest pain or tissue loss in his bilateral lower extremities.  States that pain is exacerbated by standing particularly when cooking and occasionally with activity.  Past Medical History:  Diagnosis Date  . Anemia   . Atherosclerosis   . Cancer [facility])    prostate  . CHF (congestive heart failure) ([place])   . Hypercholesteremia   . Hyperlipemia   . Hypertension   . Spinal stenosis   . Varicose vein   . Venous insufficiency    History reviewed. No pertinent family history. Past Surgical History:  Procedure Laterality Date  . BACK SURGERY    . CYSTOSCOPY WITH DIRECT VISION INTERNAL URETHROTOMY N/A 02/02/2016   Procedure: CYSTOSCOPY WITH DIRECT VISION INTERNAL URETHROTOMY;  Surgeon: Royston Cowper, MD;  Location: [facility] ORS;  Service: Urology;  Laterality: N/A;  . HOLMIUM LASER APPLICATION N/A 9/98/3382   Procedure: HOLMIUM LASER APPLICATION;  Surgeon: Royston Cowper, MD;  Location: [facility] ORS;  Service: Urology;  Laterality: N/A;  . PERIPHERAL VASCULAR CATHETERIZATION N/A 08/11/2015   Procedure: Abdominal Aortogram w/Lower Extremity;  Surgeon: Katha Cabal, MD;  Location: [place] CV LAB;  Service: Cardiovascular;  Laterality: N/A;  . PERIPHERAL  VASCULAR CATHETERIZATION  08/11/2015   Procedure: Lower Extremity Intervention;  Surgeon: Katha Cabal, MD;  Location: [place] CV LAB;  Service: Cardiovascular;;  . TONSILLECTOMY      Short Social History:  Social History   Tobacco Use  . Smoking status: Current Every Day Smoker    Packs/day: 0.50    Years: 5.00    Pack years: 2.50    Types: Cigarettes  . Smokeless tobacco: Never Used  . Tobacco comment: Has called [place] [facility] and ordered nicotene patches. Will Quit when patches start  Substance Use Topics  . Alcohol use: No    No Known Allergies  Current Outpatient Medications  Medication Sig Dispense Refill  . allopurinol (ZYLOPRIM) 300 MG tablet Take 300 mg by mouth daily.    Marland Kitchen aspirin 325 MG tablet Take 325 mg by mouth daily.    Marland Kitchen atorvastatin (LIPITOR) 80 MG tablet Take by mouth.    [name] 5 MG TABS tablet     . hydrochlorothiazide (HYDRODIURIL) 12.5 MG tablet Take 12.5 mg by mouth daily.    Marland Kitchen lactulose (CHRONULAC) 10 GM/15ML solution Take 30 mLs (20 g total) by mouth 3 (three) times daily as needed for mild constipation or moderate constipation. 240 mL 3  . oxyCODONE-acetaminophen (PERCOCET) 7.5-325 MG tablet Take by mouth 2 (two) times daily.     No current facility-administered medications for this visit.     Review of Systems  Constitutional:  Constitutional negative.  HENT: HENT negative.  Eyes: Eyes negative.  Cardiovascular: Positive for claudication.  GI: Gastrointestinal negative.  Musculoskeletal: Positive for leg pain.  Neurological:       Right eye nerve paralysis Hematologic: Hematologic/lymphatic negative.  Psychiatric: Psychiatric negative.        Objective:  Objective   Vitals:   05/18/18 1217  BP: 108/60  Pulse: 66  Resp: 18  SpO2: 100%  Weight: 166 lb (75.3 kg)  Height: 5' 11.9" (1.826 m)   Body mass index is 22.58 kg/m.  Physical Exam  Constitutional: He is oriented to person, place, and time. He appears  well-developed.  HENT:  Head: Normocephalic.  Eyes: Pupils are equal, round, and reactive to light. EOM are normal.  Neck: Normal range of motion. Neck supple.  Cardiovascular: Normal rate.  Pulses:      Femoral pulses are 2+ on the right side, and 2+ on the left side.      Popliteal pulses are 2+ on the right side, and 2+ on the left side.       Dorsalis pedis pulses are 2+ on the right side, and 2+ on the left side.  No carotid bruits  Pulmonary/Chest: Effort normal and breath sounds normal.  Abdominal: Soft. He exhibits no mass.  Musculoskeletal: Normal range of motion. He exhibits no edema.  Neurological: He is alert and oriented to person, place, and time.  Skin: Skin is dry. Capillary refill takes less than 2 seconds.  Psychiatric: He has a normal mood and affect. His behavior is normal. Judgment and thought content normal.    Data: I have independently interpreted his bilateral ABIs to be 1.2 bilaterally and triphasic there is bilateral PT arteries at the ankle.  Right great toe pressure 103 left great toe pressure 89     Assessment/Plan:     77 year old male with a history of right SFA stenting in [place].  He now sent here for bilateral lower extremity pain which I do not think is vascular nature.  He will continue his aspirin he is also on Eliquis.  Have him follow-up in 1 year with a lower extremity duplex to evaluate his stent as well as bilateral ABIs.  He is okay from vascular standpoint to proceed with any back intervention as seen fit by neurology.     Duane Sandy MD [facility]

## 2018-05-22 DIAGNOSIS — G8929 Other chronic pain: Secondary | ICD-10-CM | POA: Diagnosis not present

## 2018-05-22 DIAGNOSIS — M79605 Pain in left leg: Secondary | ICD-10-CM | POA: Diagnosis not present

## 2018-05-22 DIAGNOSIS — M545 Low back pain: Secondary | ICD-10-CM | POA: Diagnosis not present

## 2018-05-22 DIAGNOSIS — M79604 Pain in right leg: Secondary | ICD-10-CM | POA: Diagnosis not present

## 2018-06-01 ENCOUNTER — Encounter: Payer: PPO | Admitting: Vascular Surgery

## 2018-06-01 ENCOUNTER — Encounter (HOSPITAL_COMMUNITY): Payer: PPO

## 2018-06-01 DIAGNOSIS — K5909 Other constipation: Secondary | ICD-10-CM | POA: Diagnosis not present

## 2018-06-01 DIAGNOSIS — I10 Essential (primary) hypertension: Secondary | ICD-10-CM | POA: Diagnosis not present

## 2018-06-01 DIAGNOSIS — G8929 Other chronic pain: Secondary | ICD-10-CM | POA: Diagnosis not present

## 2018-06-01 DIAGNOSIS — E440 Moderate protein-calorie malnutrition: Secondary | ICD-10-CM | POA: Diagnosis not present

## 2018-06-01 DIAGNOSIS — D649 Anemia, unspecified: Secondary | ICD-10-CM | POA: Diagnosis not present

## 2018-06-01 DIAGNOSIS — F17200 Nicotine dependence, unspecified, uncomplicated: Secondary | ICD-10-CM | POA: Diagnosis not present

## 2018-06-01 DIAGNOSIS — I739 Peripheral vascular disease, unspecified: Secondary | ICD-10-CM | POA: Diagnosis not present

## 2018-06-01 DIAGNOSIS — M1A9XX0 Chronic gout, unspecified, without tophus (tophi): Secondary | ICD-10-CM | POA: Diagnosis not present

## 2018-06-01 DIAGNOSIS — N189 Chronic kidney disease, unspecified: Secondary | ICD-10-CM | POA: Diagnosis not present

## 2018-06-01 DIAGNOSIS — M109 Gout, unspecified: Secondary | ICD-10-CM | POA: Diagnosis not present

## 2018-06-01 DIAGNOSIS — M545 Low back pain: Secondary | ICD-10-CM | POA: Diagnosis not present

## 2018-06-12 ENCOUNTER — Other Ambulatory Visit: Payer: Self-pay | Admitting: Student

## 2018-06-12 DIAGNOSIS — M545 Low back pain, unspecified: Secondary | ICD-10-CM

## 2018-06-29 DIAGNOSIS — I2510 Atherosclerotic heart disease of native coronary artery without angina pectoris: Secondary | ICD-10-CM | POA: Diagnosis not present

## 2018-06-29 DIAGNOSIS — M109 Gout, unspecified: Secondary | ICD-10-CM | POA: Diagnosis not present

## 2018-06-29 DIAGNOSIS — I5022 Chronic systolic (congestive) heart failure: Secondary | ICD-10-CM | POA: Diagnosis not present

## 2018-06-29 DIAGNOSIS — E785 Hyperlipidemia, unspecified: Secondary | ICD-10-CM | POA: Diagnosis not present

## 2018-06-29 DIAGNOSIS — I739 Peripheral vascular disease, unspecified: Secondary | ICD-10-CM | POA: Diagnosis not present

## 2018-06-29 DIAGNOSIS — I10 Essential (primary) hypertension: Secondary | ICD-10-CM | POA: Diagnosis not present

## 2018-06-29 DIAGNOSIS — N189 Chronic kidney disease, unspecified: Secondary | ICD-10-CM | POA: Diagnosis not present

## 2018-06-29 DIAGNOSIS — M545 Low back pain: Secondary | ICD-10-CM | POA: Diagnosis not present

## 2018-06-29 DIAGNOSIS — E440 Moderate protein-calorie malnutrition: Secondary | ICD-10-CM | POA: Diagnosis not present

## 2018-06-29 DIAGNOSIS — F17200 Nicotine dependence, unspecified, uncomplicated: Secondary | ICD-10-CM | POA: Diagnosis not present

## 2018-06-29 DIAGNOSIS — G8929 Other chronic pain: Secondary | ICD-10-CM | POA: Diagnosis not present

## 2018-07-02 ENCOUNTER — Ambulatory Visit
Admission: RE | Admit: 2018-07-02 | Discharge: 2018-07-02 | Disposition: A | Discharge status: Home / Self Care | Payer: PPO | Source: Ambulatory Visit | Attending: Student | Admitting: Student

## 2018-07-02 DIAGNOSIS — M545 Low back pain, unspecified: Secondary | ICD-10-CM

## 2018-07-10 ENCOUNTER — Other Ambulatory Visit: Payer: Self-pay | Admitting: Student

## 2018-07-10 DIAGNOSIS — M545 Low back pain, unspecified: Secondary | ICD-10-CM

## 2018-07-10 DIAGNOSIS — G8929 Other chronic pain: Secondary | ICD-10-CM

## 2018-07-22 ENCOUNTER — Ambulatory Visit
Admission: RE | Admit: 2018-07-22 | Discharge: 2018-07-22 | Disposition: A | Discharge status: Home / Self Care | Payer: PPO | Source: Ambulatory Visit | Attending: Student | Admitting: Student

## 2018-07-22 DIAGNOSIS — M545 Low back pain, unspecified: Secondary | ICD-10-CM

## 2018-07-22 DIAGNOSIS — M48061 Spinal stenosis, lumbar region without neurogenic claudication: Secondary | ICD-10-CM | POA: Diagnosis not present

## 2018-07-22 DIAGNOSIS — G8929 Other chronic pain: Secondary | ICD-10-CM

## 2020-07-19 DIAGNOSIS — D638 Anemia in other chronic diseases classified elsewhere: Secondary | ICD-10-CM | POA: Insufficient documentation

## 2020-07-19 DIAGNOSIS — D696 Thrombocytopenia, unspecified: Secondary | ICD-10-CM | POA: Insufficient documentation

## 2020-07-19 NOTE — Progress Notes (Signed)
[place]  Telephone:(336) [phone] Fax:(336) [phone]  ID: Duane Owens OB: May 26, 1941  MR#: [id number]  CHY#:[id number]  Patient Care Team: [name], MD as PCP - General (Internal Medicine)  CHIEF COMPLAINT: Anemia and thrombocytopenia.  INTERVAL HISTORY: Patient is an 79 year old male who was noted to have a slightly decreased hemoglobin and platelet count on routine blood work.  He is referred for further evaluation.  He currently feels well and is asymptomatic.  He has no neurologic complaints.  He denies any recent fevers or illnesses.  He has a good appetite and denies weight loss.  He has no chest pain, shortness of breath, cough, or hemoptysis.  He denies any nausea, vomiting, constipation, or diarrhea.  He has no urinary complaints.  Patient feels at his baseline and offers no specific complaints today.  REVIEW OF SYSTEMS:   Review of Systems  Constitutional: Negative.  Negative for fever, malaise/fatigue and weight loss.  Respiratory: Negative.  Negative for cough, hemoptysis and shortness of breath.   Cardiovascular: Negative.  Negative for chest pain and leg swelling.  Gastrointestinal: Negative.  Negative for abdominal pain, blood in stool and melena.  Genitourinary: Negative.  Negative for hematuria.  Musculoskeletal: Negative.  Negative for back pain.  Skin: Negative.  Negative for rash.  Neurological: Negative.  Negative for dizziness, focal weakness, weakness and headaches.  Psychiatric/Behavioral: Negative.  The patient is not nervous/anxious.     As per HPI. Otherwise, a complete review of systems is negative.  PAST MEDICAL HISTORY: Past Medical History:  Diagnosis Date  . Anemia   . Atherosclerosis   . Cancer [facility])    prostate  . CHF (congestive heart failure) ([place])   . Hypercholesteremia   . Hyperlipemia   . Hypertension   . Spinal stenosis   . Varicose vein   . Venous insufficiency     PAST SURGICAL HISTORY: Past  Surgical History:  Procedure Laterality Date  . BACK SURGERY    . CYSTOSCOPY WITH DIRECT VISION INTERNAL URETHROTOMY N/A 02/02/2016   Procedure: CYSTOSCOPY WITH DIRECT VISION INTERNAL URETHROTOMY;  Surgeon: Royston Cowper, MD;  Location: [facility] ORS;  Service: Urology;  Laterality: N/A;  . HOLMIUM LASER APPLICATION N/A 8/78/6767   Procedure: HOLMIUM LASER APPLICATION;  Surgeon: Royston Cowper, MD;  Location: [facility] ORS;  Service: Urology;  Laterality: N/A;  . PERIPHERAL VASCULAR CATHETERIZATION N/A 08/11/2015   Procedure: Abdominal Aortogram w/Lower Extremity;  Surgeon: Katha Cabal, MD;  Location: [place] CV LAB;  Service: Cardiovascular;  Laterality: N/A;  . PERIPHERAL VASCULAR CATHETERIZATION  08/11/2015   Procedure: Lower Extremity Intervention;  Surgeon: Katha Cabal, MD;  Location: [place] CV LAB;  Service: Cardiovascular;;  . TONSILLECTOMY      FAMILY HISTORY: No family history on file.  ADVANCED DIRECTIVES (Y/N):  N  HEALTH MAINTENANCE: Social History   Tobacco Use  . Smoking status: Current Every Day Smoker    Packs/day: 0.50    Years: 5.00    Pack years: 2.50    Types: Cigarettes  . Smokeless tobacco: Never Used  . Tobacco comment: Has called [place] [facility] and ordered nicotene patches. Will Quit when patches start  Vaping Use  . Vaping Use: Never used  Substance Use Topics  . Alcohol use: No  . Drug use: No     Colonoscopy:  PAP:  Bone density:  Lipid panel:  Allergies  Allergen Reactions  . 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  Would consider alternative agents if possible.    Current Outpatient Medications  Medication Sig Dispense Refill  . allopurinol (ZYLOPRIM) 300 MG tablet Take 300 mg by mouth daily.    Marland Kitchen apixaban (ELIQUIS) 5 MG TABS tablet Take 5 mg by mouth 2 (two) times daily.    Marland Kitchen atorvastatin (LIPITOR) 20 MG tablet Take 20 mg by mouth daily.    .  metoprolol succinate (TOPROL-XL) 100 MG 24 hr tablet Take by mouth.    . mirtazapine (REMERON) 15 MG tablet Mirtazapine 15 MG Oral Tablet QTY: 90 each Days: 90 Refills: 0  Written: 06/05/20 Patient Instructions: TAKE 1 TABLET BY MOUTH AT BEDTIME    . nitroGLYCERIN (NITROSTAT) 0.4 MG SL tablet Place under the tongue.    . traZODone (DESYREL) 50 MG tablet Take by mouth.    Marland Kitchen amLODipine (NORVASC) 10 MG tablet Take 10 mg by mouth daily. (Patient not taking: Reported on 07/23/2020)    . hydrochlorothiazide (HYDRODIURIL) 12.5 MG tablet Take 12.5 mg by mouth daily. (Patient not taking: Reported on 07/23/2020)    . spironolactone (ALDACTONE) 25 MG tablet Take by mouth. (Patient not taking: Reported on 07/23/2020)     No current facility-administered medications for this visit.    OBJECTIVE: Vitals:   07/23/20 1114  BP: (!) 120/46  Pulse: 65  Temp: (!) 97.5 F (36.4 C)  SpO2: 100%     Body mass index is 25.69 kg/m.    ECOG FS:0 - Asymptomatic  General: Well-developed, well-nourished, no acute distress. Eyes: Pink conjunctiva, anicteric sclera. HEENT: Normocephalic, moist mucous membranes. Lungs: No audible wheezing or coughing. Heart: Regular rate and rhythm. Abdomen: Soft, nontender, no obvious distention. Musculoskeletal: No edema, cyanosis, or clubbing. Neuro: Alert, answering all questions appropriately. Cranial nerves grossly intact. Skin: No rashes or petechiae noted. Psych: Normal affect. Lymphatics: No cervical, calvicular, axillary or inguinal LAD.   LAB RESULTS:  Lab Results  Component Value Date   K 4.1 08/11/2015   BUN 29 (H) 08/11/2015   CREATININE 1.10 08/11/2015   GFRNONAA >60 08/11/2015   GFRAA >60 08/11/2015    Lab Results  Component Value Date   WBC 6.7 07/23/2020   HGB 11.7 (L) 07/23/2020   HCT 34.8 (L) 07/23/2020   MCV 93.3 07/23/2020   PLT 146 (L) 07/23/2020     STUDIES: No results found.  ASSESSMENT: Anemia and thrombocytopenia.  PLAN:    1.  Anemia: Mild.  Patient's hemoglobin is 11.7 today.  Iron stores, B12, folate, and hemolysis labs are all negative or within normal limits.  SPEP and IntelliGEN myeloid panel were drawn for completeness and are pending at time of dictation.  No intervention is needed at this time.  Patient does not require bone marrow biopsy.  Return to clinic in 3 weeks for further evaluation discussion of his results. 2.  Thrombocytopenia: Mild.  Patient's platelet count is 146 today.  Laboratory work as above.  Platelet antibodies are pending at time of dictation.  I spent a total of 45 minutes reviewing chart data, face-to-face evaluation with the patient, counseling and coordination of care as detailed above.   Patient expressed understanding and was in agreement with this plan. He also understands that He can call clinic at any time with any questions, concerns, or complaints.    Lloyd Huger, MD   07/24/2020 8:14 AM

## 2020-07-23 ENCOUNTER — Inpatient Hospital Stay: Payer: Medicare HMO

## 2020-07-23 ENCOUNTER — Other Ambulatory Visit: Payer: Self-pay

## 2020-07-23 ENCOUNTER — Inpatient Hospital Stay: Payer: Medicare HMO | Attending: Oncology | Admitting: Oncology

## 2020-07-23 DIAGNOSIS — E7800 Pure hypercholesterolemia, unspecified: Secondary | ICD-10-CM | POA: Insufficient documentation

## 2020-07-23 DIAGNOSIS — F17210 Nicotine dependence, cigarettes, uncomplicated: Secondary | ICD-10-CM | POA: Diagnosis not present

## 2020-07-23 DIAGNOSIS — Z79899 Other long term (current) drug therapy: Secondary | ICD-10-CM | POA: Insufficient documentation

## 2020-07-23 DIAGNOSIS — E785 Hyperlipidemia, unspecified: Secondary | ICD-10-CM | POA: Insufficient documentation

## 2020-07-23 DIAGNOSIS — I509 Heart failure, unspecified: Secondary | ICD-10-CM | POA: Diagnosis not present

## 2020-07-23 DIAGNOSIS — Z7901 Long term (current) use of anticoagulants: Secondary | ICD-10-CM | POA: Insufficient documentation

## 2020-07-23 DIAGNOSIS — D649 Anemia, unspecified: Secondary | ICD-10-CM | POA: Diagnosis not present

## 2020-07-23 DIAGNOSIS — D696 Thrombocytopenia, unspecified: Secondary | ICD-10-CM

## 2020-07-23 DIAGNOSIS — I110 Hypertensive heart disease with heart failure: Secondary | ICD-10-CM | POA: Insufficient documentation

## 2020-07-23 LAB — VITAMIN B12: Vitamin B-12: 339 pg/mL (ref 180–914)

## 2020-07-23 LAB — CBC
HCT: 34.8 % — ABNORMAL LOW (ref 39.0–52.0)
Hemoglobin: 11.7 g/dL — ABNORMAL LOW (ref 13.0–17.0)
MCH: 31.4 pg (ref 26.0–34.0)
MCHC: 33.6 g/dL (ref 30.0–36.0)
MCV: 93.3 fL (ref 80.0–100.0)
Platelets: 146 10*3/uL — ABNORMAL LOW (ref 150–400)
RBC: 3.73 MIL/uL — ABNORMAL LOW (ref 4.22–5.81)
RDW: 14.7 % (ref 11.5–15.5)
WBC: 6.7 10*3/uL (ref 4.0–10.5)
nRBC: 0 % (ref 0.0–0.2)

## 2020-07-23 LAB — IRON AND TIBC
Iron: 70 ug/dL (ref 45–182)
Saturation Ratios: 25 % (ref 17.9–39.5)
TIBC: 280 ug/dL (ref 250–450)
UIBC: 210 ug/dL

## 2020-07-23 LAB — FERRITIN: Ferritin: 126 ng/mL (ref 24–336)

## 2020-07-23 LAB — FOLATE: Folate: 12.5 ng/mL (ref >5.9)

## 2020-07-23 LAB — LACTATE DEHYDROGENASE: LDH: 111 U/L (ref 98–192)

## 2020-07-23 LAB — DAT, POLYSPECIFIC AHG (ARMC ONLY): Polyspecific AHG test: NEGATIVE

## 2020-07-23 NOTE — Progress Notes (Signed)
Pt referred for anemia and thrombocytopenia.

## 2020-07-24 LAB — HAPTOGLOBIN: Haptoglobin: 117 mg/dL (ref 34–355)

## 2020-07-27 LAB — PROTEIN ELECTROPHORESIS, SERUM
A/G Ratio: 1.3 (ref 0.7–1.7)
Albumin ELP: 3.6 g/dL (ref 2.9–4.4)
Alpha-1-Globulin: 0.2 g/dL (ref 0.0–0.4)
Alpha-2-Globulin: 0.7 g/dL (ref 0.4–1.0)
Beta Globulin: 0.8 g/dL (ref 0.7–1.3)
Gamma Globulin: 1 g/dL (ref 0.4–1.8)
Globulin, Total: 2.7 g/dL (ref 2.2–3.9)
Total Protein ELP: 6.3 g/dL (ref 6.0–8.5)

## 2020-07-28 DIAGNOSIS — I5022 Chronic systolic (congestive) heart failure: Secondary | ICD-10-CM | POA: Diagnosis not present

## 2020-07-28 DIAGNOSIS — Z4502 Encounter for adjustment and management of automatic implantable cardiac defibrillator: Secondary | ICD-10-CM | POA: Diagnosis not present

## 2020-07-28 LAB — PLATELET ANTIBODY PROFILE
Glycoprotein IV Antibody: NEGATIVE
HLA Ab Ser Ql EIA: NEGATIVE
IA/IIA Antibody: NEGATIVE
IB/IX Antibody: NEGATIVE
IIB/IIIA Antibody: NEGATIVE

## 2020-07-31 ENCOUNTER — Telehealth: Payer: Self-pay | Admitting: *Deleted

## 2020-07-31 NOTE — Telephone Encounter (Signed)
incoming call from Christie Beckers at [facility]. Needs dx related to process myeloid Intelligen test. Please return phone call.

## 2020-08-08 NOTE — Progress Notes (Signed)
[place]  Telephone:(336) [phone] Fax:(336) [phone]  ID: Duane Owens OB: 22-May-1941  MR#: [id number]  SEL#:[id number]  Patient Care Team: [name], MD as PCP - General (Internal Medicine)  CHIEF COMPLAINT: Anemia and thrombocytopenia.  INTERVAL HISTORY: Patient returns to clinic today for further evaluation and discussion of his laboratory results.  He continues to feel well and remains asymptomatic. He has no neurologic complaints.  He denies any recent fevers or illnesses.  He has a good appetite and denies weight loss.  He has no chest pain, shortness of breath, cough, or hemoptysis.  He denies any nausea, vomiting, constipation, or diarrhea.  He has no urinary complaints.  Patient offers no specific complaints today.  REVIEW OF SYSTEMS:   Review of Systems  Constitutional: Negative.  Negative for fever, malaise/fatigue and weight loss.  Respiratory: Negative.  Negative for cough, hemoptysis and shortness of breath.   Cardiovascular: Negative.  Negative for chest pain and leg swelling.  Gastrointestinal: Negative.  Negative for abdominal pain, blood in stool and melena.  Genitourinary: Negative.  Negative for hematuria.  Musculoskeletal: Negative.  Negative for back pain.  Skin: Negative.  Negative for rash.  Neurological: Negative.  Negative for dizziness, focal weakness, weakness and headaches.  Psychiatric/Behavioral: Negative.  The patient is not nervous/anxious.     As per HPI. Otherwise, a complete review of systems is negative.  PAST MEDICAL HISTORY: Past Medical History:  Diagnosis Date  . Anemia   . Atherosclerosis   . Cancer [facility])    prostate  . CHF (congestive heart failure) ([place])   . Hypercholesteremia   . Hyperlipemia   . Hypertension   . Spinal stenosis   . Varicose vein   . Venous insufficiency     PAST SURGICAL HISTORY: Past Surgical History:  Procedure Laterality Date  . BACK SURGERY    . CYSTOSCOPY WITH DIRECT  VISION INTERNAL URETHROTOMY N/A 02/02/2016   Procedure: CYSTOSCOPY WITH DIRECT VISION INTERNAL URETHROTOMY;  Surgeon: Royston Cowper, MD;  Location: [facility] ORS;  Service: Urology;  Laterality: N/A;  . HOLMIUM LASER APPLICATION N/A 3/56/8616   Procedure: HOLMIUM LASER APPLICATION;  Surgeon: Royston Cowper, MD;  Location: [facility] ORS;  Service: Urology;  Laterality: N/A;  . PERIPHERAL VASCULAR CATHETERIZATION N/A 08/11/2015   Procedure: Abdominal Aortogram w/Lower Extremity;  Surgeon: Katha Cabal, MD;  Location: [place] CV LAB;  Service: Cardiovascular;  Laterality: N/A;  . PERIPHERAL VASCULAR CATHETERIZATION  08/11/2015   Procedure: Lower Extremity Intervention;  Surgeon: Katha Cabal, MD;  Location: [place] CV LAB;  Service: Cardiovascular;;  . TONSILLECTOMY      FAMILY HISTORY: History reviewed. No pertinent family history.  ADVANCED DIRECTIVES (Y/N):  N  HEALTH MAINTENANCE: Social History   Tobacco Use  . Smoking status: Current Every Day Smoker    Packs/day: 0.50    Years: 5.00    Pack years: 2.50    Types: Cigarettes  . Smokeless tobacco: Never Used  . Tobacco comment: Has called [place] [facility] and ordered nicotene patches. Will Quit when patches start  Vaping Use  . Vaping Use: Never used  Substance Use Topics  . Alcohol use: No  . Drug use: No     Colonoscopy:  PAP:  Bone density:  Lipid panel:  Allergies  Allergen Reactions  . 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  Would consider alternative agents if possible.    Current Outpatient Medications  Medication Sig Dispense Refill  . allopurinol (ZYLOPRIM) 300 MG tablet Take 300 mg by mouth daily.    Marland Kitchen amLODipine (NORVASC) 10 MG tablet Take 10 mg by mouth daily.    Marland Kitchen apixaban (ELIQUIS) 5 MG TABS tablet Take 5 mg by mouth 2 (two) times daily.    Marland Kitchen atorvastatin (LIPITOR) 20 MG tablet Take 20 mg by mouth daily.    .  hydrochlorothiazide (HYDRODIURIL) 12.5 MG tablet Take 12.5 mg by mouth daily.    . metoprolol succinate (TOPROL-XL) 100 MG 24 hr tablet Take by mouth daily.    . mirtazapine (REMERON) 15 MG tablet Mirtazapine 15 MG Oral Tablet QTY: 90 each Days: 90 Refills: 0  Written: 06/05/20 Patient Instructions: TAKE 1 TABLET BY MOUTH AT BEDTIME    . nitroGLYCERIN (NITROSTAT) 0.4 MG SL tablet Place under the tongue. (Patient not taking: Reported on 08/13/2020)    . traZODone (DESYREL) 50 MG tablet Take by mouth. (Patient not taking: Reported on 08/13/2020)     No current facility-administered medications for this visit.    OBJECTIVE: Vitals:   08/13/20 1110  BP: (!) 110/55  Pulse: (!) 59  Temp: (!) 97.4 F (36.3 C)     Body mass index is 25.66 kg/m.    ECOG FS:0 - Asymptomatic  General: Well-developed, well-nourished, no acute distress. Eyes: Pink conjunctiva, anicteric sclera. HEENT: Normocephalic, moist mucous membranes. Lungs: No audible wheezing or coughing. Heart: Regular rate and rhythm. Abdomen: Soft, nontender, no obvious distention. Musculoskeletal: No edema, cyanosis, or clubbing. Neuro: Alert, answering all questions appropriately. Cranial nerves grossly intact. Skin: No rashes or petechiae noted. Psych: Normal affect.   LAB RESULTS:  Lab Results  Component Value Date   K 4.1 08/11/2015   BUN 29 (H) 08/11/2015   CREATININE 1.10 08/11/2015   GFRNONAA >60 08/11/2015   GFRAA >60 08/11/2015    Lab Results  Component Value Date   WBC 6.7 07/23/2020   HGB 11.7 (L) 07/23/2020   HCT 34.8 (L) 07/23/2020   MCV 93.3 07/23/2020   PLT 146 (L) 07/23/2020     STUDIES: No results found.  ASSESSMENT: Anemia and thrombocytopenia.  PLAN:    1. Anemia: Mild.  Patient's most recent hemoglobin is 11.7.  Iron stores, B12, folate, and hemolysis labs are all negative or within normal limits.  SPEP and IntelliGEN myeloid panel are also negative.  No intervention is needed at this time.   Patient does not require bone marrow biopsy.  After discussion with the patient, is agreed upon that no further follow-up is necessary and his laboratory work can be monitored periodically by his primary care physician.  Please refer patient back if there are any questions or concerns.   2.  Thrombocytopenia: Mild.  Patient most recent platelet count is 146.  Laboratory work as above.  Platelet antibodies are negative.  No follow-up as above.  I spent a total of 20 minutes reviewing chart data, face-to-face evaluation with the patient, counseling and coordination of care as detailed above.   Patient expressed understanding and was in agreement with this plan. He also understands that He can call clinic at any time with any questions, concerns, or complaints.    Lloyd Huger, MD   08/15/2020 9:30 AM

## 2020-08-10 LAB — INTELLIGEN MYELOID

## 2020-08-11 DIAGNOSIS — M48062 Spinal stenosis, lumbar region with neurogenic claudication: Secondary | ICD-10-CM | POA: Diagnosis not present

## 2020-08-11 DIAGNOSIS — M5136 Other intervertebral disc degeneration, lumbar region: Secondary | ICD-10-CM | POA: Diagnosis not present

## 2020-08-11 DIAGNOSIS — M5416 Radiculopathy, lumbar region: Secondary | ICD-10-CM | POA: Diagnosis not present

## 2020-08-13 ENCOUNTER — Encounter: Payer: Self-pay | Admitting: Oncology

## 2020-08-13 ENCOUNTER — Inpatient Hospital Stay: Payer: Medicare HMO | Attending: Oncology | Admitting: Oncology

## 2020-08-13 VITALS — BP 110/55 | HR 59 | Temp 97.4°F | Wt 188.7 lb

## 2020-08-13 DIAGNOSIS — Z7901 Long term (current) use of anticoagulants: Secondary | ICD-10-CM | POA: Diagnosis not present

## 2020-08-13 DIAGNOSIS — R69 Illness, unspecified: Secondary | ICD-10-CM | POA: Diagnosis not present

## 2020-08-13 DIAGNOSIS — Z79899 Other long term (current) drug therapy: Secondary | ICD-10-CM | POA: Insufficient documentation

## 2020-08-13 DIAGNOSIS — I110 Hypertensive heart disease with heart failure: Secondary | ICD-10-CM | POA: Diagnosis not present

## 2020-08-13 DIAGNOSIS — D696 Thrombocytopenia, unspecified: Secondary | ICD-10-CM | POA: Insufficient documentation

## 2020-08-13 DIAGNOSIS — E785 Hyperlipidemia, unspecified: Secondary | ICD-10-CM | POA: Insufficient documentation

## 2020-08-13 DIAGNOSIS — I872 Venous insufficiency (chronic) (peripheral): Secondary | ICD-10-CM | POA: Diagnosis not present

## 2020-08-13 DIAGNOSIS — D649 Anemia, unspecified: Secondary | ICD-10-CM | POA: Insufficient documentation

## 2020-08-13 DIAGNOSIS — F17210 Nicotine dependence, cigarettes, uncomplicated: Secondary | ICD-10-CM | POA: Insufficient documentation

## 2020-08-13 DIAGNOSIS — I509 Heart failure, unspecified: Secondary | ICD-10-CM | POA: Diagnosis not present

## 2020-08-20 DIAGNOSIS — I252 Old myocardial infarction: Secondary | ICD-10-CM | POA: Diagnosis not present

## 2020-08-20 DIAGNOSIS — I509 Heart failure, unspecified: Secondary | ICD-10-CM | POA: Diagnosis not present

## 2020-08-20 DIAGNOSIS — I739 Peripheral vascular disease, unspecified: Secondary | ICD-10-CM | POA: Diagnosis not present

## 2020-08-20 DIAGNOSIS — Z008 Encounter for other general examination: Secondary | ICD-10-CM | POA: Diagnosis not present

## 2020-08-20 DIAGNOSIS — I4891 Unspecified atrial fibrillation: Secondary | ICD-10-CM | POA: Diagnosis not present

## 2020-08-20 DIAGNOSIS — I110 Hypertensive heart disease with heart failure: Secondary | ICD-10-CM | POA: Diagnosis not present

## 2020-08-20 DIAGNOSIS — E785 Hyperlipidemia, unspecified: Secondary | ICD-10-CM | POA: Diagnosis not present

## 2020-08-20 DIAGNOSIS — D6869 Other thrombophilia: Secondary | ICD-10-CM | POA: Diagnosis not present

## 2020-08-20 DIAGNOSIS — R69 Illness, unspecified: Secondary | ICD-10-CM | POA: Diagnosis not present

## 2020-08-20 DIAGNOSIS — I25119 Atherosclerotic heart disease of native coronary artery with unspecified angina pectoris: Secondary | ICD-10-CM | POA: Diagnosis not present

## 2020-08-20 DIAGNOSIS — E261 Secondary hyperaldosteronism: Secondary | ICD-10-CM | POA: Diagnosis not present

## 2020-09-11 DIAGNOSIS — M109 Gout, unspecified: Secondary | ICD-10-CM | POA: Diagnosis not present

## 2020-09-11 DIAGNOSIS — G8929 Other chronic pain: Secondary | ICD-10-CM | POA: Diagnosis not present

## 2020-09-11 DIAGNOSIS — I739 Peripheral vascular disease, unspecified: Secondary | ICD-10-CM | POA: Diagnosis not present

## 2020-09-11 DIAGNOSIS — M13861 Other specified arthritis, right knee: Secondary | ICD-10-CM | POA: Diagnosis not present

## 2020-09-11 DIAGNOSIS — E440 Moderate protein-calorie malnutrition: Secondary | ICD-10-CM | POA: Diagnosis not present

## 2020-09-11 DIAGNOSIS — N189 Chronic kidney disease, unspecified: Secondary | ICD-10-CM | POA: Diagnosis not present

## 2020-09-11 DIAGNOSIS — R69 Illness, unspecified: Secondary | ICD-10-CM | POA: Diagnosis not present

## 2020-09-11 DIAGNOSIS — I10 Essential (primary) hypertension: Secondary | ICD-10-CM | POA: Diagnosis not present

## 2020-09-11 DIAGNOSIS — F17200 Nicotine dependence, unspecified, uncomplicated: Secondary | ICD-10-CM | POA: Diagnosis not present

## 2020-09-22 DIAGNOSIS — Z4502 Encounter for adjustment and management of automatic implantable cardiac defibrillator: Secondary | ICD-10-CM | POA: Diagnosis not present

## 2020-09-22 DIAGNOSIS — I5022 Chronic systolic (congestive) heart failure: Secondary | ICD-10-CM | POA: Diagnosis not present

## 2020-09-23 DIAGNOSIS — R42 Dizziness and giddiness: Secondary | ICD-10-CM | POA: Diagnosis not present

## 2020-09-23 DIAGNOSIS — I959 Hypotension, unspecified: Secondary | ICD-10-CM | POA: Diagnosis not present

## 2020-09-23 DIAGNOSIS — Z5321 Procedure and treatment not carried out due to patient leaving prior to being seen by health care provider: Secondary | ICD-10-CM | POA: Diagnosis not present

## 2020-09-23 DIAGNOSIS — I459 Conduction disorder, unspecified: Secondary | ICD-10-CM | POA: Diagnosis not present

## 2020-10-01 DIAGNOSIS — I5022 Chronic systolic (congestive) heart failure: Secondary | ICD-10-CM | POA: Diagnosis not present

## 2020-10-27 DIAGNOSIS — Z4502 Encounter for adjustment and management of automatic implantable cardiac defibrillator: Secondary | ICD-10-CM | POA: Diagnosis not present

## 2020-10-27 DIAGNOSIS — I5022 Chronic systolic (congestive) heart failure: Secondary | ICD-10-CM | POA: Diagnosis not present

## 2020-11-26 DIAGNOSIS — I5020 Unspecified systolic (congestive) heart failure: Secondary | ICD-10-CM | POA: Diagnosis not present

## 2020-11-26 DIAGNOSIS — I110 Hypertensive heart disease with heart failure: Secondary | ICD-10-CM | POA: Diagnosis not present

## 2020-11-26 DIAGNOSIS — Z8673 Personal history of transient ischemic attack (TIA), and cerebral infarction without residual deficits: Secondary | ICD-10-CM | POA: Diagnosis not present

## 2020-11-26 DIAGNOSIS — Z7901 Long term (current) use of anticoagulants: Secondary | ICD-10-CM | POA: Diagnosis not present

## 2020-11-26 DIAGNOSIS — Z79899 Other long term (current) drug therapy: Secondary | ICD-10-CM | POA: Diagnosis not present

## 2020-11-26 DIAGNOSIS — I4891 Unspecified atrial fibrillation: Secondary | ICD-10-CM | POA: Diagnosis not present

## 2020-11-26 DIAGNOSIS — Z8679 Personal history of other diseases of the circulatory system: Secondary | ICD-10-CM | POA: Diagnosis not present

## 2020-11-26 DIAGNOSIS — R69 Illness, unspecified: Secondary | ICD-10-CM | POA: Diagnosis not present

## 2020-11-26 DIAGNOSIS — I351 Nonrheumatic aortic (valve) insufficiency: Secondary | ICD-10-CM | POA: Diagnosis not present

## 2020-11-26 DIAGNOSIS — I5022 Chronic systolic (congestive) heart failure: Secondary | ICD-10-CM | POA: Diagnosis not present

## 2020-12-07 DIAGNOSIS — Z4502 Encounter for adjustment and management of automatic implantable cardiac defibrillator: Secondary | ICD-10-CM | POA: Diagnosis not present

## 2020-12-18 DIAGNOSIS — G8929 Other chronic pain: Secondary | ICD-10-CM | POA: Diagnosis not present

## 2020-12-18 DIAGNOSIS — E440 Moderate protein-calorie malnutrition: Secondary | ICD-10-CM | POA: Diagnosis not present

## 2020-12-18 DIAGNOSIS — N189 Chronic kidney disease, unspecified: Secondary | ICD-10-CM | POA: Diagnosis not present

## 2020-12-18 DIAGNOSIS — M13861 Other specified arthritis, right knee: Secondary | ICD-10-CM | POA: Diagnosis not present

## 2020-12-18 DIAGNOSIS — M109 Gout, unspecified: Secondary | ICD-10-CM | POA: Diagnosis not present

## 2020-12-18 DIAGNOSIS — I10 Essential (primary) hypertension: Secondary | ICD-10-CM | POA: Diagnosis not present

## 2020-12-18 DIAGNOSIS — R69 Illness, unspecified: Secondary | ICD-10-CM | POA: Diagnosis not present

## 2020-12-18 DIAGNOSIS — I739 Peripheral vascular disease, unspecified: Secondary | ICD-10-CM | POA: Diagnosis not present

## 2021-01-18 DIAGNOSIS — Z4502 Encounter for adjustment and management of automatic implantable cardiac defibrillator: Secondary | ICD-10-CM | POA: Diagnosis not present

## 2021-01-26 DIAGNOSIS — Z4502 Encounter for adjustment and management of automatic implantable cardiac defibrillator: Secondary | ICD-10-CM | POA: Diagnosis not present

## 2021-02-22 DIAGNOSIS — Z4502 Encounter for adjustment and management of automatic implantable cardiac defibrillator: Secondary | ICD-10-CM | POA: Diagnosis not present

## 2021-03-22 DIAGNOSIS — I739 Peripheral vascular disease, unspecified: Secondary | ICD-10-CM | POA: Diagnosis not present

## 2021-03-22 DIAGNOSIS — E785 Hyperlipidemia, unspecified: Secondary | ICD-10-CM | POA: Diagnosis not present

## 2021-03-23 DIAGNOSIS — I10 Essential (primary) hypertension: Secondary | ICD-10-CM | POA: Diagnosis not present

## 2021-03-23 DIAGNOSIS — R69 Illness, unspecified: Secondary | ICD-10-CM | POA: Diagnosis not present

## 2021-03-23 DIAGNOSIS — G8929 Other chronic pain: Secondary | ICD-10-CM | POA: Diagnosis not present

## 2021-03-23 DIAGNOSIS — M13861 Other specified arthritis, right knee: Secondary | ICD-10-CM | POA: Diagnosis not present

## 2021-03-23 DIAGNOSIS — E440 Moderate protein-calorie malnutrition: Secondary | ICD-10-CM | POA: Diagnosis not present

## 2021-03-23 DIAGNOSIS — M109 Gout, unspecified: Secondary | ICD-10-CM | POA: Diagnosis not present

## 2021-03-23 DIAGNOSIS — D696 Thrombocytopenia, unspecified: Secondary | ICD-10-CM | POA: Diagnosis not present

## 2021-03-23 DIAGNOSIS — I739 Peripheral vascular disease, unspecified: Secondary | ICD-10-CM | POA: Diagnosis not present

## 2021-03-23 DIAGNOSIS — N189 Chronic kidney disease, unspecified: Secondary | ICD-10-CM | POA: Diagnosis not present

## 2021-03-30 DIAGNOSIS — Z4502 Encounter for adjustment and management of automatic implantable cardiac defibrillator: Secondary | ICD-10-CM | POA: Diagnosis not present

## 2021-04-27 DIAGNOSIS — Z4502 Encounter for adjustment and management of automatic implantable cardiac defibrillator: Secondary | ICD-10-CM | POA: Diagnosis not present

## 2021-05-03 DIAGNOSIS — Z4502 Encounter for adjustment and management of automatic implantable cardiac defibrillator: Secondary | ICD-10-CM | POA: Diagnosis not present

## 2021-05-06 DIAGNOSIS — I5022 Chronic systolic (congestive) heart failure: Secondary | ICD-10-CM | POA: Diagnosis not present

## 2021-05-06 DIAGNOSIS — Z8679 Personal history of other diseases of the circulatory system: Secondary | ICD-10-CM | POA: Diagnosis not present

## 2021-06-07 DIAGNOSIS — I5022 Chronic systolic (congestive) heart failure: Secondary | ICD-10-CM | POA: Diagnosis not present

## 2021-06-07 DIAGNOSIS — Z4502 Encounter for adjustment and management of automatic implantable cardiac defibrillator: Secondary | ICD-10-CM | POA: Diagnosis not present

## 2021-06-22 DIAGNOSIS — E440 Moderate protein-calorie malnutrition: Secondary | ICD-10-CM | POA: Diagnosis not present

## 2021-06-22 DIAGNOSIS — I739 Peripheral vascular disease, unspecified: Secondary | ICD-10-CM | POA: Diagnosis not present

## 2021-06-22 DIAGNOSIS — E559 Vitamin D deficiency, unspecified: Secondary | ICD-10-CM | POA: Diagnosis not present

## 2021-06-22 DIAGNOSIS — I10 Essential (primary) hypertension: Secondary | ICD-10-CM | POA: Diagnosis not present

## 2021-06-25 DIAGNOSIS — E440 Moderate protein-calorie malnutrition: Secondary | ICD-10-CM | POA: Diagnosis not present

## 2021-06-25 DIAGNOSIS — Z1331 Encounter for screening for depression: Secondary | ICD-10-CM | POA: Diagnosis not present

## 2021-06-25 DIAGNOSIS — Z0001 Encounter for general adult medical examination with abnormal findings: Secondary | ICD-10-CM | POA: Diagnosis not present

## 2021-06-25 DIAGNOSIS — I10 Essential (primary) hypertension: Secondary | ICD-10-CM | POA: Diagnosis not present

## 2021-06-25 DIAGNOSIS — D696 Thrombocytopenia, unspecified: Secondary | ICD-10-CM | POA: Diagnosis not present

## 2021-06-25 DIAGNOSIS — N189 Chronic kidney disease, unspecified: Secondary | ICD-10-CM | POA: Diagnosis not present

## 2021-06-25 DIAGNOSIS — M13861 Other specified arthritis, right knee: Secondary | ICD-10-CM | POA: Diagnosis not present

## 2021-06-25 DIAGNOSIS — K219 Gastro-esophageal reflux disease without esophagitis: Secondary | ICD-10-CM | POA: Diagnosis not present

## 2021-06-25 DIAGNOSIS — R69 Illness, unspecified: Secondary | ICD-10-CM | POA: Diagnosis not present

## 2021-06-25 DIAGNOSIS — I739 Peripheral vascular disease, unspecified: Secondary | ICD-10-CM | POA: Diagnosis not present

## 2021-06-25 DIAGNOSIS — F17200 Nicotine dependence, unspecified, uncomplicated: Secondary | ICD-10-CM | POA: Diagnosis not present

## 2021-06-25 DIAGNOSIS — M109 Gout, unspecified: Secondary | ICD-10-CM | POA: Diagnosis not present

## 2021-06-25 DIAGNOSIS — G8929 Other chronic pain: Secondary | ICD-10-CM | POA: Diagnosis not present

## 2021-06-25 DIAGNOSIS — F5101 Primary insomnia: Secondary | ICD-10-CM | POA: Diagnosis not present

## 2021-07-01 DIAGNOSIS — I480 Paroxysmal atrial fibrillation: Secondary | ICD-10-CM | POA: Diagnosis not present

## 2021-07-01 DIAGNOSIS — I5022 Chronic systolic (congestive) heart failure: Secondary | ICD-10-CM | POA: Diagnosis not present

## 2021-07-06 DIAGNOSIS — I5042 Chronic combined systolic (congestive) and diastolic (congestive) heart failure: Secondary | ICD-10-CM | POA: Diagnosis not present

## 2021-07-07 DIAGNOSIS — H6121 Impacted cerumen, right ear: Secondary | ICD-10-CM | POA: Diagnosis not present

## 2021-07-07 DIAGNOSIS — H9011 Conductive hearing loss, unilateral, right ear, with unrestricted hearing on the contralateral side: Secondary | ICD-10-CM | POA: Diagnosis not present

## 2021-07-13 DIAGNOSIS — M5416 Radiculopathy, lumbar region: Secondary | ICD-10-CM | POA: Diagnosis not present

## 2021-07-13 DIAGNOSIS — M48062 Spinal stenosis, lumbar region with neurogenic claudication: Secondary | ICD-10-CM | POA: Diagnosis not present

## 2021-07-27 DIAGNOSIS — Z4502 Encounter for adjustment and management of automatic implantable cardiac defibrillator: Secondary | ICD-10-CM | POA: Diagnosis not present

## 2021-08-10 DIAGNOSIS — I5042 Chronic combined systolic (congestive) and diastolic (congestive) heart failure: Secondary | ICD-10-CM | POA: Diagnosis not present

## 2021-08-10 DIAGNOSIS — Z4502 Encounter for adjustment and management of automatic implantable cardiac defibrillator: Secondary | ICD-10-CM | POA: Diagnosis not present

## 2021-09-09 DIAGNOSIS — R55 Syncope and collapse: Secondary | ICD-10-CM | POA: Diagnosis not present

## 2021-09-09 DIAGNOSIS — D509 Iron deficiency anemia, unspecified: Secondary | ICD-10-CM | POA: Diagnosis not present

## 2021-09-09 DIAGNOSIS — Z95810 Presence of automatic (implantable) cardiac defibrillator: Secondary | ICD-10-CM | POA: Diagnosis not present

## 2021-09-09 DIAGNOSIS — R42 Dizziness and giddiness: Secondary | ICD-10-CM | POA: Diagnosis not present

## 2021-09-09 DIAGNOSIS — D649 Anemia, unspecified: Secondary | ICD-10-CM | POA: Diagnosis not present

## 2021-09-09 DIAGNOSIS — I5020 Unspecified systolic (congestive) heart failure: Secondary | ICD-10-CM | POA: Diagnosis not present

## 2021-09-09 DIAGNOSIS — I255 Ischemic cardiomyopathy: Secondary | ICD-10-CM | POA: Diagnosis not present

## 2021-09-09 DIAGNOSIS — N179 Acute kidney failure, unspecified: Secondary | ICD-10-CM | POA: Diagnosis not present

## 2021-09-09 DIAGNOSIS — K921 Melena: Secondary | ICD-10-CM | POA: Diagnosis not present

## 2021-09-09 DIAGNOSIS — I110 Hypertensive heart disease with heart failure: Secondary | ICD-10-CM | POA: Diagnosis not present

## 2021-09-09 DIAGNOSIS — R0602 Shortness of breath: Secondary | ICD-10-CM | POA: Diagnosis not present

## 2021-09-09 DIAGNOSIS — Z20822 Contact with and (suspected) exposure to covid-19: Secondary | ICD-10-CM | POA: Diagnosis not present

## 2021-09-09 DIAGNOSIS — I6523 Occlusion and stenosis of bilateral carotid arteries: Secondary | ICD-10-CM | POA: Diagnosis not present

## 2021-09-09 DIAGNOSIS — R110 Nausea: Secondary | ICD-10-CM | POA: Diagnosis not present

## 2021-09-10 DIAGNOSIS — R6339 Other feeding difficulties: Secondary | ICD-10-CM | POA: Diagnosis not present

## 2021-09-10 DIAGNOSIS — I5020 Unspecified systolic (congestive) heart failure: Secondary | ICD-10-CM | POA: Diagnosis not present

## 2021-09-10 DIAGNOSIS — N179 Acute kidney failure, unspecified: Secondary | ICD-10-CM | POA: Diagnosis not present

## 2021-09-10 DIAGNOSIS — I4510 Unspecified right bundle-branch block: Secondary | ICD-10-CM | POA: Diagnosis not present

## 2021-09-10 DIAGNOSIS — R42 Dizziness and giddiness: Secondary | ICD-10-CM | POA: Diagnosis not present

## 2021-09-10 DIAGNOSIS — D509 Iron deficiency anemia, unspecified: Secondary | ICD-10-CM | POA: Diagnosis not present

## 2021-09-10 DIAGNOSIS — I255 Ischemic cardiomyopathy: Secondary | ICD-10-CM | POA: Diagnosis not present

## 2021-09-10 DIAGNOSIS — R55 Syncope and collapse: Secondary | ICD-10-CM | POA: Diagnosis not present

## 2021-09-10 DIAGNOSIS — R110 Nausea: Secondary | ICD-10-CM | POA: Diagnosis not present

## 2021-09-10 DIAGNOSIS — K921 Melena: Secondary | ICD-10-CM | POA: Diagnosis not present

## 2021-09-10 DIAGNOSIS — I110 Hypertensive heart disease with heart failure: Secondary | ICD-10-CM | POA: Diagnosis not present

## 2021-09-10 DIAGNOSIS — Z95810 Presence of automatic (implantable) cardiac defibrillator: Secondary | ICD-10-CM | POA: Diagnosis not present

## 2021-09-10 DIAGNOSIS — D649 Anemia, unspecified: Secondary | ICD-10-CM | POA: Diagnosis not present

## 2021-09-10 DIAGNOSIS — R9431 Abnormal electrocardiogram [ECG] [EKG]: Secondary | ICD-10-CM | POA: Diagnosis not present

## 2021-09-11 DIAGNOSIS — F17210 Nicotine dependence, cigarettes, uncomplicated: Secondary | ICD-10-CM | POA: Diagnosis not present

## 2021-09-11 DIAGNOSIS — E785 Hyperlipidemia, unspecified: Secondary | ICD-10-CM | POA: Diagnosis not present

## 2021-09-11 DIAGNOSIS — Z7901 Long term (current) use of anticoagulants: Secondary | ICD-10-CM | POA: Diagnosis not present

## 2021-09-11 DIAGNOSIS — R55 Syncope and collapse: Secondary | ICD-10-CM | POA: Diagnosis not present

## 2021-09-11 DIAGNOSIS — N179 Acute kidney failure, unspecified: Secondary | ICD-10-CM | POA: Diagnosis not present

## 2021-09-11 DIAGNOSIS — I110 Hypertensive heart disease with heart failure: Secondary | ICD-10-CM | POA: Diagnosis not present

## 2021-09-11 DIAGNOSIS — I2510 Atherosclerotic heart disease of native coronary artery without angina pectoris: Secondary | ICD-10-CM | POA: Diagnosis not present

## 2021-09-11 DIAGNOSIS — I4510 Unspecified right bundle-branch block: Secondary | ICD-10-CM | POA: Diagnosis not present

## 2021-09-11 DIAGNOSIS — I5020 Unspecified systolic (congestive) heart failure: Secondary | ICD-10-CM | POA: Diagnosis not present

## 2021-09-11 DIAGNOSIS — Z95810 Presence of automatic (implantable) cardiac defibrillator: Secondary | ICD-10-CM | POA: Diagnosis not present

## 2021-09-11 DIAGNOSIS — D509 Iron deficiency anemia, unspecified: Secondary | ICD-10-CM | POA: Diagnosis not present

## 2021-09-11 DIAGNOSIS — I951 Orthostatic hypotension: Secondary | ICD-10-CM | POA: Diagnosis not present

## 2021-09-11 DIAGNOSIS — R110 Nausea: Secondary | ICD-10-CM | POA: Diagnosis not present

## 2021-09-11 DIAGNOSIS — Z923 Personal history of irradiation: Secondary | ICD-10-CM | POA: Diagnosis not present

## 2021-09-11 DIAGNOSIS — Z20822 Contact with and (suspected) exposure to covid-19: Secondary | ICD-10-CM | POA: Diagnosis not present

## 2021-09-11 DIAGNOSIS — I4891 Unspecified atrial fibrillation: Secondary | ICD-10-CM | POA: Diagnosis not present

## 2021-09-11 DIAGNOSIS — I6523 Occlusion and stenosis of bilateral carotid arteries: Secondary | ICD-10-CM | POA: Diagnosis not present

## 2021-09-11 DIAGNOSIS — I5042 Chronic combined systolic (congestive) and diastolic (congestive) heart failure: Secondary | ICD-10-CM | POA: Diagnosis not present

## 2021-09-11 DIAGNOSIS — I739 Peripheral vascular disease, unspecified: Secondary | ICD-10-CM | POA: Diagnosis not present

## 2021-09-11 DIAGNOSIS — D649 Anemia, unspecified: Secondary | ICD-10-CM | POA: Diagnosis not present

## 2021-09-11 DIAGNOSIS — I255 Ischemic cardiomyopathy: Secondary | ICD-10-CM | POA: Diagnosis not present

## 2021-09-11 DIAGNOSIS — Z951 Presence of aortocoronary bypass graft: Secondary | ICD-10-CM | POA: Diagnosis not present

## 2021-09-11 DIAGNOSIS — I491 Atrial premature depolarization: Secondary | ICD-10-CM | POA: Diagnosis not present

## 2021-09-11 DIAGNOSIS — M109 Gout, unspecified: Secondary | ICD-10-CM | POA: Diagnosis not present

## 2021-09-11 DIAGNOSIS — R42 Dizziness and giddiness: Secondary | ICD-10-CM | POA: Diagnosis not present

## 2021-09-11 DIAGNOSIS — Z8546 Personal history of malignant neoplasm of prostate: Secondary | ICD-10-CM | POA: Diagnosis not present

## 2021-09-11 DIAGNOSIS — G4700 Insomnia, unspecified: Secondary | ICD-10-CM | POA: Diagnosis not present

## 2021-09-11 DIAGNOSIS — R9431 Abnormal electrocardiogram [ECG] [EKG]: Secondary | ICD-10-CM | POA: Diagnosis not present

## 2021-09-11 DIAGNOSIS — K921 Melena: Secondary | ICD-10-CM | POA: Diagnosis not present

## 2021-09-11 DIAGNOSIS — I5022 Chronic systolic (congestive) heart failure: Secondary | ICD-10-CM | POA: Diagnosis not present

## 2021-09-11 DIAGNOSIS — R2681 Unsteadiness on feet: Secondary | ICD-10-CM | POA: Diagnosis not present

## 2021-09-13 DIAGNOSIS — N179 Acute kidney failure, unspecified: Secondary | ICD-10-CM | POA: Diagnosis not present

## 2021-09-13 DIAGNOSIS — I110 Hypertensive heart disease with heart failure: Secondary | ICD-10-CM | POA: Diagnosis not present

## 2021-09-13 DIAGNOSIS — K921 Melena: Secondary | ICD-10-CM | POA: Diagnosis not present

## 2021-09-13 DIAGNOSIS — D649 Anemia, unspecified: Secondary | ICD-10-CM | POA: Diagnosis not present

## 2021-09-13 DIAGNOSIS — Z95810 Presence of automatic (implantable) cardiac defibrillator: Secondary | ICD-10-CM | POA: Diagnosis not present

## 2021-09-13 DIAGNOSIS — R110 Nausea: Secondary | ICD-10-CM | POA: Diagnosis not present

## 2021-09-13 DIAGNOSIS — I5042 Chronic combined systolic (congestive) and diastolic (congestive) heart failure: Secondary | ICD-10-CM | POA: Diagnosis not present

## 2021-09-13 DIAGNOSIS — I5020 Unspecified systolic (congestive) heart failure: Secondary | ICD-10-CM | POA: Diagnosis not present

## 2021-09-15 ENCOUNTER — Emergency Department: Payer: Medicare HMO

## 2021-09-15 ENCOUNTER — Other Ambulatory Visit: Payer: Self-pay

## 2021-09-15 ENCOUNTER — Encounter: Payer: Self-pay | Admitting: *Deleted

## 2021-09-15 ENCOUNTER — Inpatient Hospital Stay: Payer: Medicare HMO

## 2021-09-15 ENCOUNTER — Inpatient Hospital Stay
Admission: EM | Admit: 2021-09-15 | Discharge: 2021-09-22 | DRG: 871 | Disposition: A | Discharge status: Home Health | Payer: Medicare HMO | Attending: Internal Medicine | Admitting: Internal Medicine

## 2021-09-15 DIAGNOSIS — M7989 Other specified soft tissue disorders: Secondary | ICD-10-CM | POA: Diagnosis not present

## 2021-09-15 DIAGNOSIS — W19XXXA Unspecified fall, initial encounter: Secondary | ICD-10-CM | POA: Diagnosis not present

## 2021-09-15 DIAGNOSIS — R7989 Other specified abnormal findings of blood chemistry: Secondary | ICD-10-CM | POA: Diagnosis not present

## 2021-09-15 DIAGNOSIS — D638 Anemia in other chronic diseases classified elsewhere: Secondary | ICD-10-CM | POA: Diagnosis not present

## 2021-09-15 DIAGNOSIS — E7800 Pure hypercholesterolemia, unspecified: Secondary | ICD-10-CM | POA: Diagnosis not present

## 2021-09-15 DIAGNOSIS — Z452 Encounter for adjustment and management of vascular access device: Secondary | ICD-10-CM | POA: Diagnosis not present

## 2021-09-15 DIAGNOSIS — R739 Hyperglycemia, unspecified: Secondary | ICD-10-CM | POA: Diagnosis not present

## 2021-09-15 DIAGNOSIS — I639 Cerebral infarction, unspecified: Secondary | ICD-10-CM | POA: Diagnosis not present

## 2021-09-15 DIAGNOSIS — I82611 Acute embolism and thrombosis of superficial veins of right upper extremity: Secondary | ICD-10-CM | POA: Diagnosis not present

## 2021-09-15 DIAGNOSIS — F32A Depression, unspecified: Secondary | ICD-10-CM | POA: Diagnosis not present

## 2021-09-15 DIAGNOSIS — G8929 Other chronic pain: Secondary | ICD-10-CM | POA: Diagnosis present

## 2021-09-15 DIAGNOSIS — I340 Nonrheumatic mitral (valve) insufficiency: Secondary | ICD-10-CM | POA: Diagnosis not present

## 2021-09-15 DIAGNOSIS — J8410 Pulmonary fibrosis, unspecified: Secondary | ICD-10-CM | POA: Diagnosis present

## 2021-09-15 DIAGNOSIS — R778 Other specified abnormalities of plasma proteins: Secondary | ICD-10-CM | POA: Diagnosis not present

## 2021-09-15 DIAGNOSIS — N171 Acute kidney failure with acute cortical necrosis: Secondary | ICD-10-CM | POA: Diagnosis not present

## 2021-09-15 DIAGNOSIS — R609 Edema, unspecified: Secondary | ICD-10-CM

## 2021-09-15 DIAGNOSIS — Z951 Presence of aortocoronary bypass graft: Secondary | ICD-10-CM

## 2021-09-15 DIAGNOSIS — Z20822 Contact with and (suspected) exposure to covid-19: Secondary | ICD-10-CM | POA: Diagnosis not present

## 2021-09-15 DIAGNOSIS — L03113 Cellulitis of right upper limb: Secondary | ICD-10-CM | POA: Diagnosis not present

## 2021-09-15 DIAGNOSIS — R000 Tachycardia, unspecified: Secondary | ICD-10-CM | POA: Diagnosis not present

## 2021-09-15 DIAGNOSIS — I130 Hypertensive heart and chronic kidney disease with heart failure and stage 1 through stage 4 chronic kidney disease, or unspecified chronic kidney disease: Secondary | ICD-10-CM | POA: Diagnosis not present

## 2021-09-15 DIAGNOSIS — Z66 Do not resuscitate: Secondary | ICD-10-CM | POA: Diagnosis not present

## 2021-09-15 DIAGNOSIS — Z923 Personal history of irradiation: Secondary | ICD-10-CM

## 2021-09-15 DIAGNOSIS — N1831 Chronic kidney disease, stage 3a: Secondary | ICD-10-CM | POA: Diagnosis present

## 2021-09-15 DIAGNOSIS — I25118 Atherosclerotic heart disease of native coronary artery with other forms of angina pectoris: Secondary | ICD-10-CM | POA: Diagnosis not present

## 2021-09-15 DIAGNOSIS — A419 Sepsis, unspecified organism: Secondary | ICD-10-CM

## 2021-09-15 DIAGNOSIS — E876 Hypokalemia: Secondary | ICD-10-CM | POA: Diagnosis present

## 2021-09-15 DIAGNOSIS — I472 Ventricular tachycardia, unspecified: Secondary | ICD-10-CM | POA: Diagnosis not present

## 2021-09-15 DIAGNOSIS — I10 Essential (primary) hypertension: Secondary | ICD-10-CM | POA: Diagnosis present

## 2021-09-15 DIAGNOSIS — E785 Hyperlipidemia, unspecified: Secondary | ICD-10-CM | POA: Diagnosis not present

## 2021-09-15 DIAGNOSIS — E870 Hyperosmolality and hypernatremia: Secondary | ICD-10-CM | POA: Diagnosis not present

## 2021-09-15 DIAGNOSIS — I5023 Acute on chronic systolic (congestive) heart failure: Secondary | ICD-10-CM | POA: Diagnosis not present

## 2021-09-15 DIAGNOSIS — E872 Acidosis, unspecified: Secondary | ICD-10-CM | POA: Diagnosis present

## 2021-09-15 DIAGNOSIS — K7460 Unspecified cirrhosis of liver: Secondary | ICD-10-CM

## 2021-09-15 DIAGNOSIS — N179 Acute kidney failure, unspecified: Secondary | ICD-10-CM

## 2021-09-15 DIAGNOSIS — Z8042 Family history of malignant neoplasm of prostate: Secondary | ICD-10-CM

## 2021-09-15 DIAGNOSIS — B9561 Methicillin susceptible Staphylococcus aureus infection as the cause of diseases classified elsewhere: Secondary | ICD-10-CM | POA: Diagnosis not present

## 2021-09-15 DIAGNOSIS — K219 Gastro-esophageal reflux disease without esophagitis: Secondary | ICD-10-CM | POA: Diagnosis present

## 2021-09-15 DIAGNOSIS — I255 Ischemic cardiomyopathy: Secondary | ICD-10-CM | POA: Diagnosis not present

## 2021-09-15 DIAGNOSIS — R6510 Systemic inflammatory response syndrome (SIRS) of non-infectious origin without acute organ dysfunction: Secondary | ICD-10-CM | POA: Diagnosis not present

## 2021-09-15 DIAGNOSIS — D649 Anemia, unspecified: Secondary | ICD-10-CM | POA: Diagnosis not present

## 2021-09-15 DIAGNOSIS — E871 Hypo-osmolality and hyponatremia: Secondary | ICD-10-CM | POA: Diagnosis not present

## 2021-09-15 DIAGNOSIS — Z888 Allergy status to other drugs, medicaments and biological substances status: Secondary | ICD-10-CM

## 2021-09-15 DIAGNOSIS — I361 Nonrheumatic tricuspid (valve) insufficiency: Secondary | ICD-10-CM | POA: Diagnosis not present

## 2021-09-15 DIAGNOSIS — R55 Syncope and collapse: Secondary | ICD-10-CM | POA: Diagnosis not present

## 2021-09-15 DIAGNOSIS — Z95810 Presence of automatic (implantable) cardiac defibrillator: Secondary | ICD-10-CM

## 2021-09-15 DIAGNOSIS — I82409 Acute embolism and thrombosis of unspecified deep veins of unspecified lower extremity: Secondary | ICD-10-CM

## 2021-09-15 DIAGNOSIS — R7881 Bacteremia: Secondary | ICD-10-CM | POA: Diagnosis not present

## 2021-09-15 DIAGNOSIS — D500 Iron deficiency anemia secondary to blood loss (chronic): Secondary | ICD-10-CM | POA: Diagnosis not present

## 2021-09-15 DIAGNOSIS — N281 Cyst of kidney, acquired: Secondary | ICD-10-CM | POA: Diagnosis not present

## 2021-09-15 DIAGNOSIS — I4729 Other ventricular tachycardia: Secondary | ICD-10-CM | POA: Diagnosis not present

## 2021-09-15 DIAGNOSIS — M109 Gout, unspecified: Secondary | ICD-10-CM | POA: Diagnosis present

## 2021-09-15 DIAGNOSIS — I480 Paroxysmal atrial fibrillation: Secondary | ICD-10-CM | POA: Diagnosis present

## 2021-09-15 DIAGNOSIS — D631 Anemia in chronic kidney disease: Secondary | ICD-10-CM | POA: Diagnosis present

## 2021-09-15 DIAGNOSIS — I2510 Atherosclerotic heart disease of native coronary artery without angina pectoris: Secondary | ICD-10-CM | POA: Diagnosis not present

## 2021-09-15 DIAGNOSIS — R791 Abnormal coagulation profile: Secondary | ICD-10-CM | POA: Diagnosis present

## 2021-09-15 DIAGNOSIS — I739 Peripheral vascular disease, unspecified: Secondary | ICD-10-CM | POA: Diagnosis present

## 2021-09-15 DIAGNOSIS — I428 Other cardiomyopathies: Secondary | ICD-10-CM | POA: Diagnosis not present

## 2021-09-15 DIAGNOSIS — R945 Abnormal results of liver function studies: Secondary | ICD-10-CM | POA: Diagnosis not present

## 2021-09-15 DIAGNOSIS — Z8673 Personal history of transient ischemic attack (TIA), and cerebral infarction without residual deficits: Secondary | ICD-10-CM

## 2021-09-15 DIAGNOSIS — Z8249 Family history of ischemic heart disease and other diseases of the circulatory system: Secondary | ICD-10-CM

## 2021-09-15 DIAGNOSIS — A4101 Sepsis due to Methicillin susceptible Staphylococcus aureus: Principal | ICD-10-CM

## 2021-09-15 DIAGNOSIS — I499 Cardiac arrhythmia, unspecified: Secondary | ICD-10-CM | POA: Diagnosis not present

## 2021-09-15 DIAGNOSIS — Y95 Nosocomial condition: Secondary | ICD-10-CM | POA: Diagnosis not present

## 2021-09-15 DIAGNOSIS — S0990XA Unspecified injury of head, initial encounter: Secondary | ICD-10-CM | POA: Diagnosis not present

## 2021-09-15 DIAGNOSIS — Z79899 Other long term (current) drug therapy: Secondary | ICD-10-CM

## 2021-09-15 DIAGNOSIS — Z7901 Long term (current) use of anticoagulants: Secondary | ICD-10-CM

## 2021-09-15 DIAGNOSIS — I248 Other forms of acute ischemic heart disease: Secondary | ICD-10-CM | POA: Diagnosis not present

## 2021-09-15 DIAGNOSIS — R6521 Severe sepsis with septic shock: Secondary | ICD-10-CM | POA: Diagnosis present

## 2021-09-15 DIAGNOSIS — Z8546 Personal history of malignant neoplasm of prostate: Secondary | ICD-10-CM

## 2021-09-15 DIAGNOSIS — F17210 Nicotine dependence, cigarettes, uncomplicated: Secondary | ICD-10-CM | POA: Diagnosis present

## 2021-09-15 DIAGNOSIS — I5022 Chronic systolic (congestive) heart failure: Secondary | ICD-10-CM | POA: Diagnosis not present

## 2021-09-15 DIAGNOSIS — Z743 Need for continuous supervision: Secondary | ICD-10-CM | POA: Diagnosis not present

## 2021-09-15 DIAGNOSIS — I808 Phlebitis and thrombophlebitis of other sites: Secondary | ICD-10-CM | POA: Diagnosis present

## 2021-09-15 DIAGNOSIS — D696 Thrombocytopenia, unspecified: Secondary | ICD-10-CM | POA: Diagnosis present

## 2021-09-15 DIAGNOSIS — J189 Pneumonia, unspecified organism: Secondary | ICD-10-CM | POA: Diagnosis not present

## 2021-09-15 DIAGNOSIS — R531 Weakness: Secondary | ICD-10-CM | POA: Diagnosis not present

## 2021-09-15 DIAGNOSIS — T801XXA Vascular complications following infusion, transfusion and therapeutic injection, initial encounter: Secondary | ICD-10-CM | POA: Diagnosis not present

## 2021-09-15 DIAGNOSIS — I809 Phlebitis and thrombophlebitis of unspecified site: Secondary | ICD-10-CM | POA: Diagnosis not present

## 2021-09-15 DIAGNOSIS — M19011 Primary osteoarthritis, right shoulder: Secondary | ICD-10-CM | POA: Diagnosis not present

## 2021-09-15 LAB — RESP PANEL BY RT-PCR (FLU A&B, COVID) ARPGX2
Influenza A by PCR: NEGATIVE
Influenza B by PCR: NEGATIVE
SARS Coronavirus 2 by RT PCR: NEGATIVE

## 2021-09-15 LAB — COMPREHENSIVE METABOLIC PANEL
ALT: 71 U/L — ABNORMAL HIGH (ref 0–44)
AST: 141 U/L — ABNORMAL HIGH (ref 15–41)
Albumin: 3.4 g/dL — ABNORMAL LOW (ref 3.5–5.0)
Alkaline Phosphatase: 101 U/L (ref 38–126)
Anion gap: 8 (ref 5–15)
BUN: 34 mg/dL — ABNORMAL HIGH (ref 8–23)
CO2: 18 mmol/L — ABNORMAL LOW (ref 22–32)
Calcium: 8.5 mg/dL — ABNORMAL LOW (ref 8.9–10.3)
Chloride: 107 mmol/L (ref 98–111)
Creatinine, Ser: 1.83 mg/dL — ABNORMAL HIGH (ref 0.61–1.24)
GFR, Estimated: 37 mL/min — ABNORMAL LOW (ref >60)
Glucose, Bld: 154 mg/dL — ABNORMAL HIGH (ref 70–99)
Potassium: 3.3 mmol/L — ABNORMAL LOW (ref 3.5–5.1)
Sodium: 133 mmol/L — ABNORMAL LOW (ref 135–145)
Total Bilirubin: 1.2 mg/dL (ref 0.3–1.2)
Total Protein: 6.6 g/dL (ref 6.5–8.1)

## 2021-09-15 LAB — CBC
HCT: 26.8 % — ABNORMAL LOW (ref 39.0–52.0)
Hemoglobin: 8.6 g/dL — ABNORMAL LOW (ref 13.0–17.0)
MCH: 30.6 pg (ref 26.0–34.0)
MCHC: 32.1 g/dL (ref 30.0–36.0)
MCV: 95.4 fL (ref 80.0–100.0)
Platelets: 122 10*3/uL — ABNORMAL LOW (ref 150–400)
RBC: 2.81 MIL/uL — ABNORMAL LOW (ref 4.22–5.81)
RDW: 16.3 % — ABNORMAL HIGH (ref 11.5–15.5)
WBC: 18.2 10*3/uL — ABNORMAL HIGH (ref 4.0–10.5)
nRBC: 0 % (ref 0.0–0.2)

## 2021-09-15 LAB — URINALYSIS, COMPLETE (UACMP) WITH MICROSCOPIC
Bilirubin Urine: NEGATIVE
Glucose, UA: NEGATIVE mg/dL
Ketones, ur: NEGATIVE mg/dL
Leukocytes,Ua: NEGATIVE
Nitrite: NEGATIVE
Protein, ur: 100 mg/dL — AB
Specific Gravity, Urine: 1.016 (ref 1.005–1.030)
pH: 5 (ref 5.0–8.0)

## 2021-09-15 LAB — BRAIN NATRIURETIC PEPTIDE: B Natriuretic Peptide: 630.9 pg/mL — ABNORMAL HIGH (ref 0.0–100.0)

## 2021-09-15 LAB — TROPONIN I (HIGH SENSITIVITY)
Troponin I (High Sensitivity): 134 ng/L (ref <18)
Troponin I (High Sensitivity): 143 ng/L (ref <18)

## 2021-09-15 LAB — PROCALCITONIN: Procalcitonin: 1.34 ng/mL

## 2021-09-15 LAB — APTT: aPTT: 44 seconds — ABNORMAL HIGH (ref 24–36)

## 2021-09-15 LAB — MAGNESIUM: Magnesium: 1.3 mg/dL — ABNORMAL LOW (ref 1.7–2.4)

## 2021-09-15 LAB — PROTIME-INR
INR: 2 — ABNORMAL HIGH (ref 0.8–1.2)
Prothrombin Time: 23 seconds — ABNORMAL HIGH (ref 11.4–15.2)

## 2021-09-15 LAB — LACTIC ACID, PLASMA
Lactic Acid, Venous: 2.9 mmol/L (ref 0.5–1.9)
Lactic Acid, Venous: 2.2 mmol/L (ref 0.5–1.9)

## 2021-09-15 MED ORDER — NICOTINE 21 MG/24HR TD PT24
21.0000 mg | MEDICATED_PATCH | Freq: Every day | TRANSDERMAL | Status: DC
  Administered 2021-09-20 – 2021-09-22 (×3): 21 mg via TRANSDERMAL
  Filled 2021-09-15 (×6): qty 1

## 2021-09-15 MED ORDER — TRAZODONE HCL 50 MG PO TABS
50.0000 mg | ORAL_TABLET | Freq: Every evening | ORAL | Status: DC | PRN
  Administered 2021-09-16 – 2021-09-19 (×4): 50 mg via ORAL
  Filled 2021-09-15 (×5): qty 1

## 2021-09-15 MED ORDER — NOREPINEPHRINE 4 MG/250ML-% IV SOLN
INTRAVENOUS | Status: AC
  Administered 2021-09-15: 2 ug/min via INTRAVENOUS
  Filled 2021-09-15: qty 250

## 2021-09-15 MED ORDER — POTASSIUM CHLORIDE CRYS ER 20 MEQ PO TBCR
40.0000 meq | EXTENDED_RELEASE_TABLET | Freq: Once | ORAL | Status: AC
  Administered 2021-09-15: 40 meq via ORAL
  Filled 2021-09-15: qty 2

## 2021-09-15 MED ORDER — DIPHENHYDRAMINE HCL 50 MG/ML IJ SOLN: 12.5000 mg | Freq: Three times a day (TID) | INTRAMUSCULAR | Status: DC | PRN

## 2021-09-15 MED ORDER — AMIODARONE IV BOLUS ONLY 150 MG/100ML
150.0000 mg | Freq: Once | INTRAVENOUS | Status: AC
  Administered 2021-09-16: 150 mg via INTRAVENOUS
  Filled 2021-09-15: qty 100

## 2021-09-15 MED ORDER — LIDOCAINE HCL URETHRAL/MUCOSAL 2 % EX GEL
1.0000 "application " | Freq: Once | CUTANEOUS | Status: AC
  Administered 2021-09-15: 1 via URETHRAL
  Filled 2021-09-15: qty 10

## 2021-09-15 MED ORDER — APIXABAN 5 MG PO TABS
5.0000 mg | ORAL_TABLET | Freq: Two times a day (BID) | ORAL | Status: DC
Start: 2021-09-15 — End: 2021-09-22
  Administered 2021-09-16 – 2021-09-22 (×14): 5 mg via ORAL
  Filled 2021-09-15 (×14): qty 1

## 2021-09-15 MED ORDER — METRONIDAZOLE 500 MG/100ML IV SOLN
500.0000 mg | Freq: Once | INTRAVENOUS | Status: AC
  Administered 2021-09-15: 500 mg via INTRAVENOUS
  Filled 2021-09-15: qty 100

## 2021-09-15 MED ORDER — SODIUM CHLORIDE 0.9 % IV BOLUS
1000.0000 mL | Freq: Once | INTRAVENOUS | Status: AC
  Administered 2021-09-15: 1000 mL via INTRAVENOUS

## 2021-09-15 MED ORDER — PANTOPRAZOLE SODIUM 40 MG PO TBEC
40.0000 mg | DELAYED_RELEASE_TABLET | Freq: Every day | ORAL | Status: DC
  Administered 2021-09-16 – 2021-09-22 (×7): 40 mg via ORAL
  Filled 2021-09-15 (×7): qty 1

## 2021-09-15 MED ORDER — SODIUM CHLORIDE 0.9 % IV SOLN: 2.0000 g | Freq: Two times a day (BID) | INTRAVENOUS | Status: DC

## 2021-09-15 MED ORDER — SODIUM CHLORIDE 0.9 % IV BOLUS
500.0000 mL | Freq: Once | INTRAVENOUS | Status: AC
  Administered 2021-09-15: 500 mL via INTRAVENOUS

## 2021-09-15 MED ORDER — VANCOMYCIN HCL IN DEXTROSE 1-5 GM/200ML-% IV SOLN: 1000.0000 mg | INTRAVENOUS | Status: DC

## 2021-09-15 MED ORDER — SODIUM CHLORIDE 0.9 % IV SOLN
2.0000 g | Freq: Two times a day (BID) | INTRAVENOUS | Status: DC
  Administered 2021-09-16 (×2): 2 g via INTRAVENOUS
  Filled 2021-09-15 (×3): qty 2

## 2021-09-15 MED ORDER — ALLOPURINOL 100 MG PO TABS
300.0000 mg | ORAL_TABLET | Freq: Every day | ORAL | Status: DC
  Administered 2021-09-16 – 2021-09-22 (×7): 300 mg via ORAL
  Filled 2021-09-15 (×2): qty 1
  Filled 2021-09-15 (×3): qty 3
  Filled 2021-09-15: qty 1
  Filled 2021-09-15 (×2): qty 3

## 2021-09-15 MED ORDER — SODIUM CHLORIDE 0.9 % IV SOLN
250.0000 mL | INTRAVENOUS | Status: DC
Start: 2021-09-15 — End: 2021-09-18

## 2021-09-15 MED ORDER — SODIUM CHLORIDE 0.9 % IV SOLN
2.0000 g | Freq: Once | INTRAVENOUS | Status: AC
  Administered 2021-09-15: 2 g via INTRAVENOUS
  Filled 2021-09-15: qty 2

## 2021-09-15 MED ORDER — VANCOMYCIN HCL IN DEXTROSE 1-5 GM/200ML-% IV SOLN
1000.0000 mg | Freq: Once | INTRAVENOUS | Status: AC
  Administered 2021-09-15: 1000 mg via INTRAVENOUS
  Filled 2021-09-15: qty 200

## 2021-09-15 MED ORDER — SODIUM CHLORIDE 0.9 % IV SOLN
INTRAVENOUS | Status: DC
Start: 2021-09-15 — End: 2021-09-16

## 2021-09-15 MED ORDER — ACETAMINOPHEN 325 MG PO TABS
325.0000 mg | ORAL_TABLET | Freq: Four times a day (QID) | ORAL | Status: DC | PRN
Start: 2021-09-15 — End: 2021-09-22
  Administered 2021-09-15 – 2021-09-16 (×2): 325 mg via ORAL
  Filled 2021-09-15 (×3): qty 1

## 2021-09-15 MED ORDER — MIRTAZAPINE 15 MG PO TABS
15.0000 mg | ORAL_TABLET | Freq: Every day | ORAL | Status: DC
  Administered 2021-09-16 – 2021-09-17 (×3): 15 mg via ORAL
  Filled 2021-09-15 (×3): qty 1

## 2021-09-15 MED ORDER — LACTATED RINGERS IV SOLN: INTRAVENOUS | Status: AC

## 2021-09-15 MED ORDER — ATORVASTATIN CALCIUM 20 MG PO TABS
20.0000 mg | ORAL_TABLET | Freq: Every day | ORAL | Status: DC
  Administered 2021-09-16 – 2021-09-22 (×7): 20 mg via ORAL
  Filled 2021-09-15 (×7): qty 1

## 2021-09-15 MED ORDER — MAGNESIUM SULFATE 2 GM/50ML IV SOLN
2.0000 g | Freq: Once | INTRAVENOUS | Status: AC
  Administered 2021-09-15: 2 g via INTRAVENOUS
  Filled 2021-09-15: qty 50

## 2021-09-15 MED ORDER — NOREPINEPHRINE 4 MG/250ML-% IV SOLN: 2.0000 ug/min | INTRAVENOUS | Status: DC

## 2021-09-15 MED ORDER — ACETAMINOPHEN 325 MG PO TABS
650.0000 mg | ORAL_TABLET | Freq: Once | ORAL | Status: AC
  Administered 2021-09-15: 650 mg via ORAL
  Filled 2021-09-15: qty 2

## 2021-09-15 MED ORDER — SODIUM CHLORIDE 0.9 % IV SOLN: 250.0000 mL | INTRAVENOUS | Status: DC

## 2021-09-15 MED ORDER — NOREPINEPHRINE 4 MG/250ML-% IV SOLN
0.0000 ug/min | INTRAVENOUS | Status: DC
Start: 2021-09-15 — End: 2021-09-15

## 2021-09-15 MED ORDER — PHENYLEPHRINE HCL-NACL 20-0.9 MG/250ML-% IV SOLN
25.0000 ug/min | INTRAVENOUS | Status: DC
  Administered 2021-09-15 – 2021-09-16 (×2): 25 ug/min via INTRAVENOUS
  Filled 2021-09-15 (×3): qty 250

## 2021-09-15 NOTE — ED Notes (Signed)
[place] at [facility], family at [facility]. VSS.

## 2021-09-15 NOTE — Consult Note (Signed)
Pharmacy Antibiotic Note  Duane Owens is a 81 y.o. male admitted on 09/15/2021 with sepsis (unk source).  Pharmacy has been consulted for Vancomycin and Cefepime dosing.  Plan: Cefepime 2g IV q12h Vancomycin 1g+1g (to complete 2g loading dose); then Vancomycin 1g q24h Goal AUC 400-550  Est AUC: 512; Cmax: 31; Cmin: 14.4 SCr 1.83; IBW/TBW; Vd 0.72  F/u MRSA PCR ordered & cultures.   Height: 6' 0.01" (182.9 cm) Weight: 81.6 kg (180 lb) IBW/kg (Calculated) : 77.62  Temp (24hrs), Avg:100.5 F (38.1 C), Min:100.5 F (38.1 C), Max:100.5 F (38.1 C)  Recent Labs  Lab 09/15/21 1327 09/15/21 1332 09/15/21 1554  WBC 18.2*  --   --   CREATININE 1.83*  --   --   LATICACIDVEN  --  2.9* 2.2*    Estimated Creatinine Clearance: 34.7 mL/min (A) (by C-G formula based on SCr of 1.83 mg/dL (H)).    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  Would consider alternative agents if possible.    Antimicrobials this admission: Vancomycin  (2/22 >>  Cefepime (2/22 >>  Metronidazole x1 (2/22)  Dose adjustments this admission: CTM and adjust PRN  Microbiology results: 2/22 BCx: sent 2/22 UCx: sent  2/22 MRSA PCR: ordered  Thank you for allowing pharmacy to be a part of this patients care.  Lorna Dibble, PharmD, [facility] Clinical Pharmacist 09/15/2021 6:28 PM

## 2021-09-15 NOTE — ED Notes (Signed)
[place] at [facility]. Pt alert, NAD, calm, interactive, follows commands, no changes. VSS. Family at [facility].

## 2021-09-15 NOTE — ED Triage Notes (Signed)
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 Has pacemaker. Amiodarone 150mg  given PTA. Previousluy mentioned "feel good, just dizzy and weak". NSL in L FA. R arm infiltrated "from recent admission". EDP present on arrival.

## 2021-09-15 NOTE — Progress Notes (Signed)
Ultrasounded guided PIV started and documented at 7:27PM.

## 2021-09-15 NOTE — Consult Note (Signed)
CODE SEPSIS - PHARMACY COMMUNICATION  **Broad Spectrum Antibiotics should be administered within 1 hour of Sepsis diagnosis**  Time Code Sepsis Called/Page Received: 1333  Antibiotics Ordered: 1345  Time of 1st antibiotic administration: 1428  Additional action taken by pharmacy: N/A  If necessary, Name of Provider/Nurse Contacted: N/A  Lorna Dibble ,PharmD Clinical Pharmacist  09/15/2021  1:53 PM

## 2021-09-15 NOTE — Progress Notes (Signed)
An USGPIV (ultrasound guided PIV) has been placed for short-term vasopressor infusion. A correctly placed ivWatch must be used when administering Vasopressors. Should this treatment be needed beyond 72 hours, central line access should be obtained.  It will be the responsibility of the bedside nurse to follow best practice to prevent extravasations.   ?

## 2021-09-15 NOTE — Sepsis Progress Note (Signed)
Notified provider of need to order fluid bolus.  ?

## 2021-09-15 NOTE — ED Notes (Signed)
BP still low, MAPs in the 50s,  checked in other extremity, IVF bolus continues, no improvement yet. Admitting MD notified via secure messaging.

## 2021-09-15 NOTE — ED Notes (Signed)
Renal US at [facility]

## 2021-09-15 NOTE — Consult Note (Signed)
PHARMACY -  BRIEF ANTIBIOTIC NOTE   Pharmacy has received consult(s) for vancomycin and cefepime from an ED provider.  The patient's profile has been reviewed for ht/wt/allergies/indication/available labs.    One time order(s) placed for: Vancomycin 1g IV x1 Cefepime 2g IV x1 Also receiving metronidazole 500mg  IV x1  Further antibiotics/pharmacy consults should be ordered by admitting physician if indicated.                       Thank you, Lorna Dibble 09/15/2021  1:51 PM

## 2021-09-15 NOTE — Sepsis Progress Note (Signed)
eLink is following this Code Sepsis. °

## 2021-09-15 NOTE — ED Notes (Signed)
Delay in care d/t difficult stick, 2nd IV established by [place]. Labs and 1 set [facility] obtained

## 2021-09-15 NOTE — H&P (Signed)
History and Physical    Duane Owens:[id number] DOB: [birth year] DOA: 09/15/2021  Referring MD/NP/PA:   PCP: [name], MD   Patient coming from:  The patient is coming from home.      Chief Complaint: Generalized weakness, fall  HPI: Duane Owens is a 81 y.o. male with medical history significant of a fib on Eliquis, hypertension, hyperlipidemia, GERD, gout, depression, varicose vein, sCHF with EF of 35%, prostate cancer (s/p of radiation therapy), anemia, chronic pain, thrombocytopenia, CAD, pacemaker placement, CKD-3, who present fall and weakness.  Patient was recently hospitalized in [facility] on 09/09/2021.  Patient had melena and GI was consulted, patient was given IV iron.  Plan is to follow up with GI for outpatient colonoscopy. Hemoglobin was stabilized.  Eliquis was restarted per discharge summary.  At that time patient also had dizziness which was thought to be likely due to orthostatic status. Per her daughter at bedside, patient has not been feeling well in the past several days.  Patient has dizziness, generalized weakness. He fell twice since today.  No loss of consciousness. Patient has chronic shortness of breath, no active, cough or chest pain.  He has nausea, no vomiting, diarrhea or abdominal pain.  No rectal bleeding or dark stool.  Denies symptoms of UTI. Pt was found to have fever of 100.5 in ED.   Patient was found to have SIRS with persistent hypotension, not responding to IV fluid resuscitation.  No source of infection identified.  Levophed drip is started.   Data Reviewed and ED Course: pt was found to have negative COVID PCR, WBC 18.2, lactic acid 2.9, INR 2.0, PTT 44, troponin level 134 --> 143, BNP 630, abnormal liver function with ALP 101, AST 141, ALT 71, total bilirubin 1.2, Urinalysis not impressive, worsening renal function with creatinine 1.83, BUN 34, GFR 37 (creatinine was 1.01 at baseline recently), temperature 100.5, blood pressure 102/64 -->  decreased to SBP 80s.  RR 29, heart rate 93, oxygen sat 99% on 2 L oxygen.  Chest x-ray showed bilateral mild diffused opacity, possible edema.  Ultrasound of her right upper quadrant is negative for acute issues.  Renal ultrasound is negative for hydronephrosis.  Pending CT scan of head.  Patient is admitted to ICU as inpatient.  Dr. Mortimer Fries of ICU is consulted.  EKG: I have personally reviewed.  Sinus rhythm, QTc 503, frequent PVC, right bundle blockade, early R wave progression   Review of Systems:   General: has fevers, no chills, no body weight gain, has poor appetite, has fatigue HEENT: no blurry vision, hearing changes or sore throat Respiratory: has dyspnea, no coughing, wheezing CV: no chest pain, no palpitations GI: no nausea, vomiting, abdominal pain, diarrhea, constipation GU: no dysuria, burning on urination, increased urinary frequency, hematuria  Ext: no leg edema Neuro: no unilateral weakness, numbness, or tingling, no vision change or hearing loss. Has fall Skin: no rash, no skin tear. MSK: No muscle spasm, no deformity, no limitation of range of movement in spin Heme: No easy bruising.  Travel history: No recent long distant travel.   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  Would consider alternative agents if possible.    Past Medical History:  Diagnosis Date   Anemia    Atherosclerosis    Cancer [facility])    prostate   CHF (congestive heart failure) (HCC)    Hypercholesteremia  Hyperlipemia    Hypertension    Spinal stenosis    Varicose vein    Venous insufficiency     Past Surgical History:  Procedure Laterality Date   BACK SURGERY     CYSTOSCOPY WITH DIRECT VISION INTERNAL URETHROTOMY N/A 02/02/2016   Procedure: CYSTOSCOPY WITH DIRECT VISION INTERNAL URETHROTOMY;  Surgeon: Royston Cowper, MD;  Location: [facility] ORS;  Service: Urology;   Laterality: N/A;   HOLMIUM LASER APPLICATION N/A 2/94/7654   Procedure: HOLMIUM LASER APPLICATION;  Surgeon: Royston Cowper, MD;  Location: [facility] ORS;  Service: Urology;  Laterality: N/A;   PERIPHERAL VASCULAR CATHETERIZATION N/A 08/11/2015   Procedure: Abdominal Aortogram w/Lower Extremity;  Surgeon: Katha Cabal, MD;  Location: [place] [facility];  Service: Cardiovascular;  Laterality: N/A;   PERIPHERAL VASCULAR CATHETERIZATION  08/11/2015   Procedure: Lower Extremity Intervention;  Surgeon: Katha Cabal, MD;  Location: [place] [facility];  Service: Cardiovascular;;   TONSILLECTOMY      Social History:  reports that he has been smoking cigarettes. He has a 2.50 pack-year smoking history. He has never used smokeless tobacco. He reports that he does not drink alcohol and does not use drugs.  Family History:  Family History  Problem Relation Age of Onset   Heart failure Mother    Diabetes Brother    COPD Brother      Prior to Admission medications   Medication Sig Start Date End Date Taking? Authorizing Provider  allopurinol (ZYLOPRIM) 300 MG tablet Take 300 mg by mouth daily.    [provider]  amLODipine (NORVASC) 10 MG tablet Take 10 mg by mouth daily. 06/16/20   [provider]  apixaban (ELIQUIS) 5 MG TABS tablet Take 5 mg by mouth 2 (two) times daily.    [provider]  atorvastatin (LIPITOR) 20 MG tablet Take 20 mg by mouth daily.    [provider]  ENTRESTO 97-103 MG Take 1 tablet by mouth daily. 04/14/21   [provider]  furosemide (LASIX) 20 MG tablet Take 20 mg by mouth daily. 08/14/21   [provider]  hydrochlorothiazide (HYDRODIURIL) 12.5 MG tablet Take 12.5 mg by mouth daily.    [provider]  metoprolol succinate (TOPROL-XL) 100 MG 24 hr tablet Take by mouth daily. Patient not taking: Reported on 09/15/2021 05/26/20   [provider]  metoprolol succinate (TOPROL-XL) 50 MG 24 hr  tablet Take 50 mg by mouth daily. 09/13/21   [provider]  mirtazapine (REMERON) 15 MG tablet Mirtazapine 15 MG Oral Tablet QTY: 90 each Days: 90 Refills: 0  Written: 06/05/20 Patient Instructions: TAKE 1 TABLET BY MOUTH AT BEDTIME 06/05/20   [provider]  MITIGARE 0.6 MG CAPS Take 0.3 mg by mouth daily. 04/21/21   [provider]  nitroGLYCERIN (NITROSTAT) 0.4 MG SL tablet Place under the tongue. Patient not taking: Reported on 08/13/2020 02/22/19   [provider]  pantoprazole (PROTONIX) 40 MG tablet Take 40 mg by mouth daily. 06/26/21   [provider]  traZODone (DESYREL) 50 MG tablet Take by mouth. Patient not taking: Reported on 08/13/2020 03/12/20   [provider]  valsartan (DIOVAN) 80 MG tablet Take by mouth. 07/08/21   [provider]    Physical Exam: Vitals:   09/15/21 1850 09/15/21 1855 09/15/21 1915 09/15/21 2000  BP: (!) 97/49 (!) 104/53 (!) 112/59 (!) 117/55  Pulse: (!) 50 65 68 73  Resp: 20 (!) 22 (!) 21 (!) 26  Temp:      TempSrc:      SpO2: 98% 99% 100% 100%  Weight:      Height:       General: Not in acute distress HEENT:       Eyes: PERRL, EOMI, no scleral icterus.       ENT: No discharge from the ears and nose, no pharynx injection, no tonsillar enlargement.        Neck: No JVD, no bruit, no mass felt. Heme: No neck lymph node enlargement. Cardiac: S1/S2, RRR, No murmurs, No gallops or rubs. Respiratory: No rales, wheezing, rhonchi or rubs. GI: Soft, nondistended, nontender, no rebound pain, no organomegaly, BS present. GU: No hematuria Ext: No pitting leg edema bilaterally. 1+DP/PT pulse bilaterally. Musculoskeletal: No joint deformities, No joint redness or warmth, no limitation of ROM in spin. Skin: No rashes.  Neuro: Alert, oriented X3, cranial nerves II-XII grossly intact, moves all extremities normally. Psych: Patient is not psychotic, no suicidal or hemocidal ideation.  Labs on  Admission: I have personally reviewed following labs and imaging studies  CBC: Recent Labs  Lab 09/15/21 1327  WBC 18.2*  HGB 8.6*  HCT 26.8*  MCV 95.4  PLT 676*   Basic Metabolic Panel: Recent Labs  Lab 09/15/21 1327 09/15/21 1554  NA 133*  --   K 3.3*  --   CL 107  --   CO2 18*  --   GLUCOSE 154*  --   BUN 34*  --   CREATININE 1.83*  --   CALCIUM 8.5*  --   MG  --  1.3*   GFR: Estimated Creatinine Clearance: 34.7 mL/min (A) (by C-G formula based on SCr of 1.83 mg/dL (H)). Liver Function Tests: Recent Labs  Lab 09/15/21 1327  AST 141*  ALT 71*  ALKPHOS 101  BILITOT 1.2  PROT 6.6  ALBUMIN 3.4*   No results for input(s): LIPASE, AMYLASE in the last 168 hours. No results for input(s): AMMONIA in the last 168 hours. Coagulation Profile: Recent Labs  Lab 09/15/21 1327  INR 2.0*   Cardiac Enzymes: No results for input(s): CKTOTAL, CKMB, CKMBINDEX, TROPONINI in the last 168 hours. BNP (last 3 results) No results for input(s): PROBNP in the last 8760 hours. HbA1C: No results for input(s): HGBA1C in the last 72 hours. CBG: No results for input(s): GLUCAP in the last 168 hours. Lipid Profile: No results for input(s): CHOL, HDL, LDLCALC, TRIG, CHOLHDL, LDLDIRECT in the last 72 hours. Thyroid Function Tests: No results for input(s): TSH, T4TOTAL, FREET4, T3FREE, THYROIDAB in the last 72 hours. Anemia Panel: No results for input(s): VITAMINB12, FOLATE, FERRITIN, TIBC, IRON, RETICCTPCT in the last 72 hours. Urine analysis:    Component Value Date/Time   COLORURINE AMBER (A) 09/15/2021 1705   APPEARANCEUR CLOUDY (A) 09/15/2021 1705   APPEARANCEUR CLOUDY 05/31/2014 2217   LABSPEC 1.016 09/15/2021 1705   LABSPEC 1.020 05/31/2014 2217   PHURINE 5.0 09/15/2021 1705   GLUCOSEU NEGATIVE 09/15/2021 1705   GLUCOSEU see comment 05/31/2014 2217   HGBUR LARGE (A) 09/15/2021 1705   BILIRUBINUR NEGATIVE 09/15/2021 1705   BILIRUBINUR see comment 05/31/2014 2217    [place] NEGATIVE 09/15/2021 1705   PROTEINUR 100 (A) 09/15/2021 1705   NITRITE NEGATIVE 09/15/2021 1705   LEUKOCYTESUR NEGATIVE 09/15/2021 1705   LEUKOCYTESUR see comment 05/31/2014 2217   Sepsis Labs: @LABRCNTIP (procalcitonin:4,lacticidven:4) ) Recent Results (from the past 240 hour(s))  Resp Panel by RT-PCR (Flu A&B, Covid) Nasopharyngeal Swab     Status: None  Collection Time: 09/15/21  1:28 PM   Specimen: Nasopharyngeal Swab; Nasopharyngeal(NP) swabs in vial transport medium  Result Value Ref Range Status   SARS Coronavirus 2 by RT PCR NEGATIVE NEGATIVE Final    Comment: (NOTE) SARS-CoV-2 target nucleic acids are NOT DETECTED.  The SARS-CoV-2 RNA is generally detectable in upper respiratory specimens during the acute phase of infection. The lowest concentration of SARS-CoV-2 viral copies this assay can detect is 138 copies/mL. A negative result does not preclude SARS-Cov-2 infection and should not be used as the sole basis for treatment or other patient management decisions. A negative result may occur with  improper specimen collection/handling, submission of specimen other than nasopharyngeal swab, presence of viral mutation(s) within the areas targeted by this assay, and inadequate number of viral copies(<138 copies/mL). A negative result must be combined with clinical observations, patient history, and epidemiological information. The expected result is Negative.  Fact Sheet for Patients:  EntrepreneurPulse.com.au  Fact Sheet for Healthcare Providers:  IncredibleEmployment.be  This test is no t yet approved or cleared by the [place] FDA and  has been authorized for detection and/or diagnosis of SARS-CoV-2 by FDA under an Emergency Use Authorization (EUA). This EUA will remain  in effect (meaning this test can be used) for the duration of the COVID-19 declaration under Section 564(b)(1) of the Act, 21 U.S.C.section  360bbb-3(b)(1), unless the authorization is terminated  or revoked sooner.       Influenza A by PCR NEGATIVE NEGATIVE Final   Influenza B by PCR NEGATIVE NEGATIVE Final    Comment: (NOTE) The Xpert Xpress SARS-CoV-2/FLU/RSV plus assay is intended as an aid in the diagnosis of influenza from Nasopharyngeal swab specimens and should not be used as a sole basis for treatment. Nasal washings and aspirates are unacceptable for Xpert Xpress SARS-CoV-2/FLU/RSV testing.  Fact Sheet for Patients: EntrepreneurPulse.com.au  Fact Sheet for Healthcare Providers: IncredibleEmployment.be  This test is not yet approved or cleared by the [place] FDA and has been authorized for detection and/or diagnosis of SARS-CoV-2 by FDA under an Emergency Use Authorization (EUA). This EUA will remain in effect (meaning this test can be used) for the duration of the COVID-19 declaration under Section 564(b)(1) of the Act, 21 U.S.C. section 360bbb-3(b)(1), unless the authorization is terminated or revoked.  Performed at [facility], [address] [place], [place] [address]      Radiological Exams on Admission: CT HEAD WO CONTRAST (5MM)  Result Date: 09/15/2021 CLINICAL DATA:  Trauma EXAM: CT HEAD WITHOUT CONTRAST TECHNIQUE: Contiguous axial images were obtained from the base of the skull through the vertex without intravenous contrast. RADIATION DOSE REDUCTION: This exam was performed according to the departmental dose-optimization program which includes automated exposure control, adjustment of the mA and/or kV according to patient size and/or use of iterative reconstruction technique. COMPARISON:  None. FINDINGS: Brain: No acute intracranial findings are seen in noncontrast CT brain. Cortical sulci are prominent. Ventricles are not dilated. There is no focal mass effect. Vascular: Unremarkable Skull: Unremarkable. Sinuses/Orbits: There is mild mucosal  thickening in the ethmoid and sphenoid sinuses. Other: None IMPRESSION: No acute intracranial findings are seen in noncontrast CT brain. Atrophy. Electronically Signed   By: Elmer Picker M.D.   On: 09/15/2021 20:03   US RENAL  Result Date: 09/15/2021 CLINICAL DATA:  Renal dysfunction EXAM: RENAL / URINARY TRACT ULTRASOUND COMPLETE COMPARISON:  None. FINDINGS: Right Kidney: Renal measurements: 11.1 x 5.5 x 6.9 cm = volume: 219.6 mL. Echogenicity within normal  limits. No mass or hydronephrosis visualized. Left Kidney: Renal measurements: 11.2 x 6.1 x 4.8 cm = volume: 171.5 mL. There is no hydronephrosis. There is 1.2 cm cyst in the left renal cortex. Cortical echogenicity is unremarkable. Bladder: Foley catheter is seen in the bladder. Bladder is empty limiting evaluation. Other: None. IMPRESSION: There is no hydronephrosis. Cortical echogenicity in the kidneys is unremarkable. 1.2 cm left renal cyst. Electronically Signed   By: Elmer Picker M.D.   On: 09/15/2021 18:13   DG Chest Portable 1 View  Result Date: 09/15/2021 CLINICAL DATA:  Weakness, fall. EXAM: PORTABLE CHEST 1 VIEW COMPARISON:  Chest radiograph dated 05/06/2008. FINDINGS: The heart size is normal. Vascular calcifications are seen in the aortic arch. Mild diffuse bilateral interstitial opacities likely represent pulmonary edema. There is no pleural effusion or pneumothorax. Degenerative changes are seen in the spine. A left subclavian approach cardiac device is noted. Median sternotomy wires are intact and well aligned defibrillator pads overlie the chest. IMPRESSION: Mild diffuse bilateral interstitial opacities likely represent pulmonary edema. Aortic Atherosclerosis (ICD10-I70.0). Electronically Signed   By: Zerita Boers M.D.   On: 09/15/2021 13:41   US ABDOMEN LIMITED RUQ (LIVER/GB)  Result Date: 09/15/2021 CLINICAL DATA:  Abnormal liver function tests EXAM: ULTRASOUND ABDOMEN LIMITED RIGHT UPPER QUADRANT COMPARISON:  None.  FINDINGS: Gallbladder: No gallstones or wall thickening visualized. No sonographic Murphy sign noted by sonographer. Common bile duct: Diameter: 3.8 mm Liver: No focal lesion identified. Within normal limits in parenchymal echogenicity. Portal vein is patent on color Doppler imaging with normal direction of blood flow towards the liver. Other: None. IMPRESSION: No sonographic abnormality is seen in the right upper quadrant of abdomen. Electronically Signed   By: Elmer Picker M.D.   On: 09/15/2021 16:05      Assessment/Plan Principal Problem:   SIRS (systemic inflammatory response syndrome) (HCC) Active Problems:   Cerebral vascular accident ([place])   Thrombocytopenia (HCC)   HTN (hypertension)   HLD (hyperlipidemia)   Chronic systolic CHF (congestive heart failure) (HCC)   Normocytic anemia   CAD (coronary artery disease)   Fall   Elevated troponin   Abnormal LFTs   Acute renal failure superimposed on stage 3a chronic kidney disease (HCC)   NSVT (nonsustained ventricular tachycardia)   Hypokalemia   SIRS (systemic inflammatory response syndrome) ([place]): Patient has SIRS with WBC 18.2, tachycardia with heart rate 93, With RR 29, Lactic Acid 2.9.  No clear source of infection identified.  Chest x-ray showed bilateral diffuse opacity which is likely due to edema.  Urinalysis negative.  We will treat patient as SIRS now.  If clear source of infection is identified, will treat as severe sepsis. Patient Has persistent Hypotension even with IV fluid resuscitation.  After given 1.8 L of normal saline, blood pressure still in SBP of 80s.  Levophed is started.  Consulted Dr. Mortimer Fries of ICU.  -Admitted to ICU as inpatient -Antibiotics: Vancomycin cefepime (patient received 1 dose of Flagyl in ED) -Blood culture, urine culture -will get Procalcitonin and trend lactic acid levels per sepsis protocol. -IVF: 1.8 L of NS bolus in ED, followed by 75 cc/h (patient has congestive heart failure with EF  35%, limiting aggressive IV fluids treatment). -on Levophed gtt now  Hx of cerebral vascular accident ([place]) -Lipitor -pt also on Eliquis for A fib  Thrombocytopenia ([place]): this is chronic issue. Plalete 146 on 07/23/20 --> 122, likely due to SIRS -f/u by CBC  HTN (hypertension) -Hold all Bp med  HLD (  hyperlipidemia) -Lipitor  Chronic systolic CHF (congestive heart failure) ([place]): 2D echo on 09/10/2021 showed EF of 35%.  Patient does not have leg edema.  Chest x-ray showed bilateral mild diffused opacity, possibly due to pulmonary edema, will not start diuretics due to hypotension -Watch volume status closely  Normocytic anemia: Hemoglobin stable, 8.6 today (8.9 on 09/13/2021) -Follow-up with CBC  CAD (coronary artery disease) and elevated troponin: Troponin level 134, 143, no chest pain, likely due to demand ischemia -Lipitor -Trend troponin -will not start ASA since pt is on Eliquis -Check A1c, FLP  Fall -Fall precaution -PT/OT when able to  Abnormal LFTs: Possibly due to SIRS.  Right upper quadrant ultrasound is negative. -Carefully use Tylenol (patient cannot use ibuprofen due to worsening renal function) for fever -Check hepatitis panel  Acute renal failure superimposed on stage 3a chronic kidney disease ([place]): Renal ultrasound is negative for hydronephrosis.  May be due to ATN secondary to hypotension -Avoid using renal toxic medications -Follow-up by BMP -IV fluid as above  NSVT (nonsustained ventricular tachycardia): Per report, patient had episode of NSVT.  Patient was given amiodarone initially, which was stopped. -Telemetry monitoring.  Hypokalemia and hypomagnesemia: Potassium 3 .3, magnesium 1.3 -Repleted potassium and magnesium        DVT ppx: Eliquis  Code Status: DNR per his daughter. Per his daughter, OK to use vasopressor  Family Communication:   Yes, patient's  daughter  at bed side.   Disposition Plan:  Anticipate discharge back to previous  environment  Consults called:  Dr. Mortimer Fries of ICU  Admission status and Level of care: ICU:    as inpt     Severity of Illness:  The appropriate patient status for this patient is INPATIENT. Inpatient status is judged to be reasonable and necessary in order to provide the required intensity of service to ensure the patient's safety. The patient's presenting symptoms, physical exam findings, and initial radiographic and laboratory data in the context of their chronic comorbidities is felt to place them at high risk for further clinical deterioration. Furthermore, it is not anticipated that the patient will be medically stable for discharge from the hospital within 2 midnights of admission.   * I certify that at the point of admission it is my clinical judgment that the patient will require inpatient hospital care spanning beyond 2 midnights from the point of admission due to high intensity of service, high risk for further deterioration and high frequency of surveillance required.*       Date of Service 09/15/2021    Ivor Costa [facility]   If 7PM-7AM, please contact night-coverage [email or web address] 09/15/2021, 8:27 PM

## 2021-09-15 NOTE — ED Provider Notes (Signed)
[facility] Provider Note    Event Date/Time   First MD Initiated Contact with Patient 09/15/21 1331     (approximate)  History   Chief Complaint: Fall  HPI  Duane Owens is a 81 y.o. male with a past medical history of anemia, CHF, pacemaker on Eliquis who presents to the emergency department for weakness and a fall.  According to the patient he was admitted to [facility] recently for dizziness ultimately was discharged home several days ago.  Patient states he has been feeling extremely weak over the past 2 or 3 days and today had a fall.  States he does not know why he fell.  Does not believe he hit his head but is not sure.  No reported LOC.  Patient denies any recent cough nausea vomiting or diarrhea.  Does admit continues to feel dizzy as well as weak.  Patient noted to be febrile to 100.5 on arrival he is also tachypneic at 32 breaths/min meeting SIRS criteria.  Physical Exam   Triage Vital Signs: ED Triage Vitals  Enc Vitals Group     BP 09/15/21 1328 (!) 131/51     Pulse Rate 09/15/21 1328 92     Resp 09/15/21 1328 18     Temp 09/15/21 1328 (!) 100.5 F (38.1 C)     Temp src --      SpO2 09/15/21 1323 99 %     Weight 09/15/21 1329 180 lb (81.6 kg)     Height --      Head Circumference --      [facility] Flow --      Pain Score 09/15/21 1329 0     Pain Loc --      Pain Edu? --      Excl. in [place]? --     Most recent vital signs: Vitals:   09/15/21 1328 09/15/21 1337  BP: (!) 131/51 (!) 111/51  Pulse: 92 93  Resp: 18 (!) 32  Temp: (!) 100.5 F (38.1 C)   SpO2: 100% 100%    General: Awake, no distress.  Does appear weak. CV:  Good peripheral perfusion.  Regular rate and rhythm  Resp:  Normal effort.  Equal breath sounds bilaterally.  Abd:  No distention.  Soft, nontender.  No rebound or guarding. Other:  No lower extremity edema or tenderness.  Patient does have some erythema and tenderness to the right upper extremity antecubital fossa where  he had an infiltrated IV at [facility] last week per patient.   ED Results / Procedures / Treatments   EKG  EKG viewed and interpreted by myself shows an irregular rhythm 89 bpm with a widened QRS, normal axis, slight QTc prolongation otherwise normal intervals with nonspecific ST changes.  RADIOLOGY  I personally reviewed the x-ray images appears to have some interstitial opacities for possible atypical pneumonia. Radiology is read the x-ray as bilateral interstitial opacities possibly pulmonary edema.   MEDICATIONS ORDERED IN ED: Medications  sodium chloride 0.9 % bolus 1,000 mL (has no administration in time range)  lactated ringers infusion (has no administration in time range)  ceFEPIme (MAXIPIME) 2 g in sodium chloride 0.9 % 100 mL IVPB (has no administration in time range)  metroNIDAZOLE (FLAGYL) IVPB 500 mg (has no administration in time range)  vancomycin (VANCOCIN) IVPB 1000 mg/200 mL premix (has no administration in time range)     IMPRESSION / MDM / ASSESSMENT AND PLAN / ED COURSE  I reviewed the triage vital  signs and the nursing notes.  Patient presents to the emergency department for weakness and a fall.  EMS states the patient had several runs of ventricular tachycardia and they started the patient on amiodarone prior to arrival to the emergency department.  Here patient appears to be in an irregular rhythm possibly atrial fibrillation.  Patient has a reassuring blood pressure 111/51.  He is noted to be febrile as well as tachypneic to 32 meeting SIRS criteria.  We will start the patient on broad-spectrum antibiotics, IV fluids.  We will check labs, cultures, chest x-ray, urinalysis.  We will continue to closely monitor while awaiting results.  Given the patient's fever weakness and fall anticipate likely admission to the hospital once his emergency department work-up is been completed.  I reviewed the patient's discharge summary from 09/13/2021.  Patient was noted to be anemic  on that visit as well with a hemoglobin of 9.8.  Patient was experiencing dizziness which he states has been intermittent since COVID in December.  Patient noted to have severe heart failure with ejection fraction of 20% in 2019 most recent cardiogram of 40%.  Patient had his ICD interrogated with no significant arrhythmias at that time.  Three-vessel CABG in 2019.  History of atrial fibrillation was previously on amiodarone but now on metoprolol and Eliquis.  Patient's lab work shows CBC with a white blood cell count of 18,000, continues to be anemic down to 8.6.  Patient's troponin has resulted elevated as well.  He is currently on Eliquis.  We will repeat a troponin at 2 hours to trend but will hold off on heparin currently.  Lactate is 2.9.  Patient receiving IV fluids.  Patient's chemistry has resulted showing elevated LFTs.  I reviewed patient's labs from [facility] in care everywhere, no significant elevation of LFTs at that time.  Given the patient's elevated LFTs and fever we will obtain a right upper quadrant ultrasound as a precaution to rule out gallbladder pathology.  Patient's chest x-ray shows could be edema versus atypical infection which could explain the fever as well.  Patient will require admission to the hospital service for further work-up and treatment.  CRITICAL CARE Performed by: Harvest Dark   Total critical care time: 30 minutes  Critical care time was exclusive of separately billable procedures and treating other patients.  Critical care was necessary to treat or prevent imminent or life-threatening deterioration.  Critical care was time spent personally by me on the following activities: development of treatment plan with patient and/or surrogate as well as nursing, discussions with consultants, evaluation of patient's response to treatment, examination of patient, obtaining history from patient or surrogate, ordering and performing treatments and interventions, ordering  and review of laboratory studies, ordering and review of radiographic studies, pulse oximetry and re-evaluation of patient's condition.   FINAL CLINICAL IMPRESSION(S) / ED DIAGNOSES   Fall Weakness Fever Sepsis  Note:  This document was prepared using Dragon voice recognition software and may include unintentional dictation errors.   Harvest Dark, MD 09/15/21 1455

## 2021-09-16 ENCOUNTER — Inpatient Hospital Stay: Payer: Medicare HMO

## 2021-09-16 DIAGNOSIS — R7989 Other specified abnormal findings of blood chemistry: Secondary | ICD-10-CM

## 2021-09-16 DIAGNOSIS — R778 Other specified abnormalities of plasma proteins: Secondary | ICD-10-CM

## 2021-09-16 DIAGNOSIS — I4729 Other ventricular tachycardia: Secondary | ICD-10-CM

## 2021-09-16 DIAGNOSIS — I25118 Atherosclerotic heart disease of native coronary artery with other forms of angina pectoris: Secondary | ICD-10-CM

## 2021-09-16 DIAGNOSIS — I255 Ischemic cardiomyopathy: Secondary | ICD-10-CM

## 2021-09-16 DIAGNOSIS — N171 Acute kidney failure with acute cortical necrosis: Secondary | ICD-10-CM | POA: Diagnosis not present

## 2021-09-16 DIAGNOSIS — E872 Acidosis, unspecified: Secondary | ICD-10-CM

## 2021-09-16 DIAGNOSIS — W19XXXA Unspecified fall, initial encounter: Secondary | ICD-10-CM

## 2021-09-16 DIAGNOSIS — A419 Sepsis, unspecified organism: Secondary | ICD-10-CM

## 2021-09-16 DIAGNOSIS — I5022 Chronic systolic (congestive) heart failure: Secondary | ICD-10-CM

## 2021-09-16 DIAGNOSIS — R6510 Systemic inflammatory response syndrome (SIRS) of non-infectious origin without acute organ dysfunction: Secondary | ICD-10-CM

## 2021-09-16 DIAGNOSIS — E870 Hyperosmolality and hypernatremia: Secondary | ICD-10-CM

## 2021-09-16 DIAGNOSIS — D649 Anemia, unspecified: Secondary | ICD-10-CM

## 2021-09-16 DIAGNOSIS — I5023 Acute on chronic systolic (congestive) heart failure: Secondary | ICD-10-CM

## 2021-09-16 DIAGNOSIS — N1831 Chronic kidney disease, stage 3a: Secondary | ICD-10-CM

## 2021-09-16 DIAGNOSIS — E871 Hypo-osmolality and hyponatremia: Secondary | ICD-10-CM

## 2021-09-16 DIAGNOSIS — R6520 Severe sepsis without septic shock: Secondary | ICD-10-CM

## 2021-09-16 LAB — LIPID PANEL
Cholesterol: 70 mg/dL (ref 0–200)
HDL: 18 mg/dL — ABNORMAL LOW (ref >40)
LDL Cholesterol: 36 mg/dL (ref 0–99)
Total CHOL/HDL Ratio: 3.9 RATIO
Triglycerides: 79 mg/dL (ref <150)
VLDL: 16 mg/dL (ref 0–40)

## 2021-09-16 LAB — TROPONIN I (HIGH SENSITIVITY)
Troponin I (High Sensitivity): 117 ng/L (ref <18)
Troponin I (High Sensitivity): 165 ng/L (ref <18)
Troponin I (High Sensitivity): 136 ng/L (ref <18)
Troponin I (High Sensitivity): 133 ng/L (ref <18)

## 2021-09-16 LAB — CBC
HCT: 24.5 % — ABNORMAL LOW (ref 39.0–52.0)
Hemoglobin: 7.9 g/dL — ABNORMAL LOW (ref 13.0–17.0)
MCH: 30 pg (ref 26.0–34.0)
MCHC: 32.2 g/dL (ref 30.0–36.0)
MCV: 93.2 fL (ref 80.0–100.0)
Platelets: 104 10*3/uL — ABNORMAL LOW (ref 150–400)
RBC: 2.63 MIL/uL — ABNORMAL LOW (ref 4.22–5.81)
RDW: 16.3 % — ABNORMAL HIGH (ref 11.5–15.5)
WBC: 15.5 10*3/uL — ABNORMAL HIGH (ref 4.0–10.5)
nRBC: 0 % (ref 0.0–0.2)

## 2021-09-16 LAB — BASIC METABOLIC PANEL
Anion gap: 4 — ABNORMAL LOW (ref 5–15)
BUN: 30 mg/dL — ABNORMAL HIGH (ref 8–23)
CO2: 18 mmol/L — ABNORMAL LOW (ref 22–32)
Calcium: 7.9 mg/dL — ABNORMAL LOW (ref 8.9–10.3)
Chloride: 113 mmol/L — ABNORMAL HIGH (ref 98–111)
Creatinine, Ser: 1.36 mg/dL — ABNORMAL HIGH (ref 0.61–1.24)
GFR, Estimated: 52 mL/min — ABNORMAL LOW (ref >60)
Glucose, Bld: 104 mg/dL — ABNORMAL HIGH (ref 70–99)
Potassium: 3.8 mmol/L (ref 3.5–5.1)
Sodium: 135 mmol/L (ref 135–145)

## 2021-09-16 LAB — HEPATITIS PANEL, ACUTE
HCV Ab: NONREACTIVE
Hep A IgM: NONREACTIVE
Hep B C IgM: NONREACTIVE
Hepatitis B Surface Ag: NONREACTIVE

## 2021-09-16 LAB — MAGNESIUM: Magnesium: 1.9 mg/dL (ref 1.7–2.4)

## 2021-09-16 LAB — GLUCOSE, CAPILLARY: Glucose-Capillary: 113 mg/dL — ABNORMAL HIGH (ref 70–99)

## 2021-09-16 LAB — LACTIC ACID, PLASMA: Lactic Acid, Venous: 1.3 mmol/L (ref 0.5–1.9)

## 2021-09-16 LAB — MRSA NEXT GEN BY PCR, NASAL: MRSA by PCR Next Gen: NOT DETECTED

## 2021-09-16 LAB — PHOSPHORUS: Phosphorus: 2.4 mg/dL — ABNORMAL LOW (ref 2.5–4.6)

## 2021-09-16 MED ORDER — K PHOS MONO-SOD PHOS DI & MONO 155-852-130 MG PO TABS
500.0000 mg | ORAL_TABLET | Freq: Once | ORAL | Status: AC
  Administered 2021-09-16: 500 mg via ORAL
  Filled 2021-09-16: qty 2

## 2021-09-16 MED ORDER — PREDNISONE 10 MG PO TABS
10.0000 mg | ORAL_TABLET | Freq: Every day | ORAL | Status: DC
  Administered 2021-09-16 – 2021-09-22 (×7): 10 mg via ORAL
  Filled 2021-09-16 (×7): qty 1

## 2021-09-16 MED ORDER — VANCOMYCIN HCL 1250 MG/250ML IV SOLN
1250.0000 mg | INTRAVENOUS | Status: DC
  Administered 2021-09-16: 1250 mg via INTRAVENOUS
  Filled 2021-09-16: qty 250

## 2021-09-16 MED ORDER — MAGNESIUM SULFATE 2 GM/50ML IV SOLN: 2.0000 g | Freq: Once | INTRAVENOUS | Status: DC

## 2021-09-16 MED ORDER — POTASSIUM CHLORIDE CRYS ER 20 MEQ PO TBCR
20.0000 meq | EXTENDED_RELEASE_TABLET | Freq: Once | ORAL | Status: AC
  Administered 2021-09-16: 20 meq via ORAL
  Filled 2021-09-16: qty 1

## 2021-09-16 MED ORDER — MAGNESIUM SULFATE 2 GM/50ML IV SOLN
2.0000 g | Freq: Once | INTRAVENOUS | Status: AC
  Administered 2021-09-16: 2 g via INTRAVENOUS
  Filled 2021-09-16: qty 50

## 2021-09-16 MED ORDER — CHLORHEXIDINE GLUCONATE CLOTH 2 % EX PADS
6.0000 | MEDICATED_PAD | Freq: Every day | CUTANEOUS | Status: DC
  Administered 2021-09-16 – 2021-09-22 (×5): 6 via TOPICAL

## 2021-09-16 MED ORDER — MIDODRINE HCL 5 MG PO TABS: 10.0000 mg | ORAL_TABLET | Freq: Three times a day (TID) | ORAL | Status: DC

## 2021-09-16 MED ORDER — DIGOXIN 0.25 MG/ML IJ SOLN
0.2500 mg | Freq: Once | INTRAMUSCULAR | Status: AC
  Administered 2021-09-16: 0.25 mg via INTRAVENOUS
  Filled 2021-09-16: qty 2

## 2021-09-16 NOTE — Consult Note (Addendum)
PHARMACY CONSULT NOTE   Pharmacy Consult for Electrolyte Monitoring and Replacement   Recent Labs: Potassium (mmol/L)  Date Value  09/16/2021 3.8   Magnesium (mg/dL)  Date Value  09/16/2021 1.9   Calcium (mg/dL)  Date Value  09/16/2021 7.9 (L)   Albumin (g/dL)  Date Value  09/15/2021 3.4 (L)   Phosphorus (mg/dL)  Date Value  09/16/2021 2.4 (L)   Sodium (mmol/L)  Date Value  09/16/2021 135   Assessment: Patient is an 81 y/o M with medical history including Afib on Eliquis, HTN, HLD, depression, varicose veins, systolic CHF, prostate cancer s/p radiation therapy, anemia, chronic pain, CAD, pacemaker placement, CKD, recent hospital encounter on 09/09/21 for melena who is admitted with sepsis suspected to be secondary to cellulitis secondary to infected IV access from recent hospital encounter. Pharmacy consulted to assist with electrolyte monitoring and replacement as indicated.  Goal of Therapy:  K >=4 Mg >= 2 All other electrolytes within normal limits  Plan:  --AKI resolving, fluids discontinued given history of CHF --K 3.8, will give Kcl 20 mEq x 1 dose --Hypomagnesemia resolved with replacement yesterday, though, will give a repeat dose of IV magnesium sulfate 2 g IV x 1 given Afib to target Mg > 2 --Phosphorous 2.4, will give a dose of K phos neutral 500 mg x 1 today --Re-check electrolytes with AM labs tomorrow  Benita Gutter 09/16/2021 11:25 AM

## 2021-09-16 NOTE — TOC Initial Note (Signed)
Transition of Care [facility]) - Initial/Assessment Note    Patient Details  Name: Duane Owens MRN: [id number] Date of Birth: 1941/05/06  [facility] CM/SW Contact:    Shelbie Hutching, RN Phone Number: 09/16/2021, 1:26 PM  Clinical Narrative:                 Patient admitted to the hospital with acute kidney injury and SIRS.   RNCM met with patient and patient's daughter at the bedside, introduced self and explained role.  Daughter Duane Owens lives with patient, Daughter Duane [place] is [place] and in [place]  Duane Owens reports that patient is independent at home and drives, he requires no assisted devices, they may have a cane at home but no other DME.  Patient is current with [facility] Dr. Ermalene Searing Beecher Mcardle for primary care services, cardiologist is at [facility].  Patient gets prescriptions from [facility].   Patient has been to [facility] in the past but daughter reports he would not want to go back to SNF if possible.  He has also had Home Health with [facility] in the past, patient would prefer to go home with [facility] services.  TOC will follow for needs.  Currently in the ICU.    Expected Discharge Plan: [place] Barriers to Discharge: Continued Medical Work up   Patient Goals and CMS Choice Patient states their goals for this hospitalization and ongoing recovery are:: For patient to feel better and be able to return home CMS Medicare.gov Compare Post Acute Care list provided to:: Patient Choice offered to / list presented to : Patient, Adult Children  Expected Discharge Plan and Services Expected Discharge Plan: [place]   Discharge Planning Services: CM Consult Post Acute Care Choice: [place] arrangements for the past 2 months: Single Family Home                 DME Arranged: N/A DME Agency: NA                  Prior Living Arrangements/Services Living arrangements for the past 2 months: Single Family Home Lives with:: Adult  Children Patient language and need for interpreter reviewed:: Yes Do you feel safe going back to the place where you live?: Yes      Need for Family Participation in Patient Care: Yes (Comment) Care giver support system in place?: Yes (comment) (daughters)   Criminal Activity/Legal Involvement Pertinent to Current Situation/Hospitalization: No - Comment as needed  Activities of Daily Living Home Assistive Devices/Equipment: None ADL Screening (condition at time of admission) Patient's cognitive ability adequate to safely complete daily activities?: Yes Is the patient deaf or have difficulty hearing?: Yes Does the patient have difficulty seeing, even when wearing glasses/contacts?: No Does the patient have difficulty concentrating, remembering, or making decisions?: No Patient able to express need for assistance with ADLs?: Yes Does the patient have difficulty dressing or bathing?: No Independently performs ADLs?: Yes (appropriate for developmental age) Does the patient have difficulty walking or climbing stairs?: No Weakness of Legs: Both Weakness of Arms/Hands: None  Permission Sought/Granted Permission sought to share information with : Case Manager, Family Supports Permission granted to share information with : Yes, Verbal Permission Granted  Share Information with NAME: Ferlin [place] (daughter) [phone]- lives with patient     Permission granted to share info w Relationship: daughter Gerrie Nordmann- [phone]     Emotional Assessment Appearance:: Appears stated age Attitude/Demeanor/Rapport: Engaged  Affect (typically observed): Accepting Orientation: : Oriented to Self, Oriented to Place, Oriented to  Time, Oriented to Situation Alcohol / Substance Use: Not Applicable Psych Involvement: No (comment)  Admission diagnosis:  AKI (acute kidney injury) ([place]) [N17.9] Severe sepsis ([place]) [A41.9, R65.20] Patient Active Problem List   Diagnosis Date Noted   SIRS  (systemic inflammatory response syndrome) ([place]) 09/15/2021   HTN (hypertension) 09/15/2021   HLD (hyperlipidemia) 63/07/6008   Chronic systolic CHF (congestive heart failure) ([place]) 09/15/2021   Normocytic anemia 09/15/2021   CAD (coronary artery disease) 09/15/2021   Fall 09/15/2021   Elevated troponin 09/15/2021   Abnormal LFTs 09/15/2021   Acute renal failure superimposed on stage 3a chronic kidney disease ([place]) 09/15/2021   NSVT (nonsustained ventricular tachycardia) 09/15/2021   Hypokalemia 09/15/2021   Severe sepsis ([place]) 09/15/2021   Anemia, unspecified 07/19/2020   Thrombocytopenia ([place]) 93/23/5573   Systolic heart failure ([place]) 02/26/2018   Acute on chronic systolic heart failure ([place]) 12/15/2017   Cerebral vascular accident ([place]) 12/15/2017   PAD (peripheral artery disease) ([place]) 11/17/2017   Chronic low back pain (Location of Primary Source of Pain) (Bilateral) (R>L) 11/01/2016   Chronic lower extremity pain (Location of Secondary source of pain) (Right) 11/01/2016   Chronic pain syndrome 11/01/2016   Long term current use of opiate analgesic 11/01/2016   Long term prescription opiate use 11/01/2016   Opiate use (45 MME/Day) 11/01/2016   Abnormal MRI, lumbar spine (05/28/2014) 11/01/2016   Failed back surgical syndrome x 2 (decompression by Dr. Albesa Seen) 11/01/2016   Lumbar central spinal stenosis (severe at L2-3 and L3-4) 11/01/2016   Lumbar lateral recess stenosis (Left L3-4; Bilateral L5; Right L4-5) 11/01/2016   Chronic radicular pain of lower extremity 11/01/2016   Lumbar facet hypertrophy 11/01/2016   Lumbar spondylosis 11/01/2016   Lumbar foraminal stenosis (L4-5) (Bilateral) (R>L) 11/01/2016   Neurogenic pain 11/01/2016   Gout 11/01/2016   Mixed hyperlipidemia 11/01/2016   Essential hypertension, benign 11/01/2016   History of hepatitis 11/01/2016   History of prostate cancer 11/01/2016   PCP:  [name], MD Pharmacy:   [facility] [place], [place] [address] [address] Phone: [phone] Fax: [place], [place] [place] [address] Phone: [phone] Fax: [phone]     Social Determinants of Health (SDOH) Interventions    Readmission Risk Interventions No flowsheet data found.

## 2021-09-16 NOTE — Consult Note (Signed)
NAME:  Duane Owens, MRN:  [id number], DOB:  1940-11-23, LOS: 1 ADMISSION DATE:  09/15/2021, CONSULTATION DATE:  09/15/2021 REFERRING MD:  Ivor Costa, MD  CHIEF COMPLAINT: Generalized weakness and Fall   HPI  81 y.o male with significant PMH paroxysmal atrial fibrillation, HTN, HLD, GERD, anxiety and depression, HFrEF s/p ICD   Ischemic cardiomyopathy, CAD status post 3 vessel CABG in 2019 (LIMA to LAD, SVG to OM, SVG to PDA)   PAD, Anemia, Amiodarone induced lung injury, prostate cancer (s/p of radiation therapy), anemia, chronic pain, thrombocytopeniawho presented to the ED with chief complaints of generalized weakness and fall.  Per chart review, patient was recently admitted at [facility] on 09/09/2021 with symptomatic anemia.  Was evaluated by GI and given IV infusion with plans to follow-up with GI for possible colonoscopy.  Per patient's daughter, since discharge patient has complaining of generalized weakness and dizziness resulting in a fall twice today.  Denied hitting his head or loss of consciousness.  ED Course: In the emergency department, the temperature was 101.8 (38.8) C, the heart rate 130 beats/minute, the blood pressure 142/116 mm Hg, the respiratory rate 36 breaths/minute, and the oxygen saturation 100% on 2L. He was lethargic, and responded appropriately with one-word answers; symmetric movement in the arms and legs was observed. Chest x-ray showed bilateral mild diffused opacity, possible edema.  Ultrasound of her right upper quadrant is negative for acute issues.  Renal ultrasound is negative for hydronephrosis.  CT head showed no acute intracranial abnormality. Labs revealed WBC 18.2, Plts 122, Hgb/Hct: 8.6/26.8, Lactic acid 2.9, procal 1.34,  INR 2.0, PTT 44, troponin level 134 --> 143, BNP 630, elevated LFTs with ALP 101, AST 141, ALT 71, total bilirubin 1.2, worsening renal function with creatinine 1.83, BUN 34, GFR 37, Na+/ K+: 133/3.3. UA with moderate pyuria without evidence of  UTI.  Patient given IVF bolus and started on broad-spectrum antibiotics Vanco cefepime and Flagyl for sepsis with septic shock. Patient admitted under hospitalist service for further management. Patient remained hypotensive despite IVF boluses therefore was started on Levophed. PCCM consulted.  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 2/22: Noncontrast CT head>No acute intracranial abnormality 2/22: US Abdomen/Pelvis>No sonographic abnormality is seen in the right upper quadrant of abdomen 2/22 US Renal>There is no hydronephrosis. Cortical echogenicity in the kidneys is unremarkable. 1.2 cm left renal cyst.   Micro Data:  2/22: SARS-CoV-2 PCR> negative 2/22: Influenza PCR> negative 2/22: Blood culture x2> 2/22: Urine Culture> 2/22: MRSA PCR>>   Antimicrobials:  Vancomycin 2/22> Cefepime 2/22>  OBJECTIVE  Blood pressure (!) 90/50, pulse 76, temperature 98.7 F (37.1 C), temperature source Oral, resp. rate (!) 21, height 6' (1.829 m), weight 82.9 kg, SpO2 100 %.    FiO2 (%):  [100 %] 100 %   Intake/Output Summary (Last 24 hours) at 09/16/2021 0556 Last data filed at 09/16/2021 0400 Gross per 24 hour  Intake 4161.61 ml  Output --  Net 4161.61 ml   Filed Weights   09/15/21 1329 09/16/21 0126  Weight: 81.6 kg 82.9  kg   Physical Examination  GENERAL: 81 year-old critically ill patient lying in the bed with no acute distress.  EYES: Pupils equal, round, reactive to light and accommodation. No scleral icterus.  Extraocular muscles intact.  HEENT: Head atraumatic, normocephalic. Oropharynx and nasopharynx clear.  NECK:  Supple, no jugular venous distention. No thyroid enlargement, no tenderness.  LUNGS: Normal breath sounds bilaterally, no wheezing, rales,rhonchi or crepitation. No use of accessory muscles of respiration.  CARDIOVASCULAR: S1, S2 normal. No murmurs, rubs, or gallops.  ABDOMEN: Soft, nontender, nondistended. Bowel sounds present. No organomegaly or mass.  EXTREMITIES: Bilateral edema of the lower and upper extremities. No pedal edema, cyanosis, or clubbing.  NEUROLOGIC: Cranial nerves II through XII are intact.  Muscle strength 5/5 in all extremities. Sensation intact. Gait not checked.  PSYCHIATRIC: The patient is alert and oriented x 2.  SKIN: See    Labs/imaging that I havepersonally reviewed  (right click and "Reselect all SmartList Selections" daily)     Labs   CBC: Recent Labs  Lab 09/15/21 1327 09/16/21 0419  WBC 18.2* 15.5*  HGB 8.6* 7.9*  HCT 26.8* 24.5*  MCV 95.4 93.2  PLT 122* 104*    Basic Metabolic Panel: Recent Labs  Lab 09/15/21 1327 09/15/21 1554 09/16/21 0419  NA 133*  --  135  K 3.3*  --  3.8  CL 107  --  113*  CO2 18*  --  18*  GLUCOSE 154*  --  104*  BUN 34*  --  30*  CREATININE 1.83*  --  1.36*  CALCIUM 8.5*  --  7.9*  MG  --  1.3* 1.9   GFR: Estimated Creatinine Clearance: 46.8 mL/min (A) (by C-G formula based on SCr of 1.36 mg/dL (H)). Recent Labs  Lab 09/15/21 1327 09/15/21 1332 09/15/21 1554 09/15/21 1705 09/15/21 2357 09/16/21 0419  PROCALCITON  --   --   --  1.34  --   --   WBC 18.2*  --   --   --   --  15.5*  LATICACIDVEN  --  2.9* 2.2*  --  1.3  --     Liver Function Tests: Recent Labs  Lab 09/15/21 1327  AST 141*  ALT 71*  ALKPHOS 101  BILITOT 1.2  PROT 6.6  ALBUMIN 3.4*   No results for input(s): LIPASE, AMYLASE in the last 168 hours. No results for input(s): AMMONIA in the last 168 hours.  ABG No  results found for: PHART, PCO2ART, PO2ART, HCO3, TCO2, ACIDBASEDEF, O2SAT   Coagulation Profile: Recent Labs  Lab 09/15/21 1327  INR 2.0*    Cardiac Enzymes: No results for input(s): CKTOTAL, CKMB, CKMBINDEX, TROPONINI in the last 168 hours.  HbA1C: No results found for: HGBA1C  CBG: Recent Labs  Lab 09/16/21 0129  GLUCAP 113*    Review of Systems:   UNABLE TO OBTAIN PATIENT IS LETHARGIC   Past Medical History  He,  has a past medical history of Anemia, Atherosclerosis, Cancer ([place]), CHF (congestive heart failure) ([place]), Hypercholesteremia, Hyperlipemia, Hypertension, Spinal stenosis, Varicose vein, and Venous insufficiency.   Surgical History    Past Surgical History:  Procedure Laterality Date   BACK SURGERY     CYSTOSCOPY WITH DIRECT VISION INTERNAL URETHROTOMY N/A 02/02/2016   Procedure: CYSTOSCOPY WITH DIRECT VISION INTERNAL URETHROTOMY;  Surgeon: Royston Cowper, MD;  Location: [facility];  Service: Urology;  Laterality: N/A;   HOLMIUM LASER APPLICATION N/A 0/26/3785   Procedure: HOLMIUM LASER APPLICATION;  Surgeon: Royston Cowper,  MD;  Location: [facility];  Service: Urology;  Laterality: N/A;   PERIPHERAL VASCULAR CATHETERIZATION N/A 08/11/2015   Procedure: Abdominal Aortogram w/Lower Extremity;  Surgeon: Katha Cabal, MD;  Location: [place] [facility];  Service: Cardiovascular;  Laterality: N/A;   PERIPHERAL VASCULAR CATHETERIZATION  08/11/2015   Procedure: Lower Extremity Intervention;  Surgeon: Katha Cabal, MD;  Location: [place] [facility];  Service: Cardiovascular;;   TONSILLECTOMY       Social History   reports that he has been smoking cigarettes. He has a 2.50 pack-year smoking history. He has never used smokeless tobacco. He reports that he does not drink alcohol and does not use drugs.   Family History   His family history includes COPD in his brother; Diabetes in his brother; Heart failure in his mother.   Allergies Allergies  Allergen  Reactions   Amiodarone Other (See Comments)    Patient developed pulmonary amiodarone toxicity requiring steroid treatment.  Would consider alternative agents if possible.     Home Medications  Prior to Admission medications   Medication Sig Start Date End Date Taking? Authorizing Provider  allopurinol (ZYLOPRIM) 300 MG tablet Take 300 mg by mouth daily.   Yes [provider]  amLODipine (NORVASC) 10 MG tablet Take 10 mg by mouth daily. 06/16/20  Yes [provider]  apixaban (ELIQUIS) 5 MG TABS tablet Take 5 mg by mouth 2 (two) times daily.   Yes [provider]  atorvastatin (LIPITOR) 20 MG tablet Take 20 mg by mouth daily.   Yes [provider]  metoprolol succinate (TOPROL-XL) 50 MG 24 hr tablet Take 50 mg by mouth daily. 09/13/21  Yes [provider]  mirtazapine (REMERON) 15 MG tablet Mirtazapine 15 MG Oral Tablet QTY: 90 each Days: 90 Refills: 0  Written: 06/05/20 Patient Instructions: TAKE 1 TABLET BY MOUTH AT BEDTIME 06/05/20  Yes [provider]  MITIGARE 0.6 MG CAPS Take 0.3 mg by mouth daily. 04/21/21  Yes [provider]  pantoprazole (PROTONIX) 40 MG tablet Take 40 mg by mouth daily. 06/26/21  Yes [provider]  ENTRESTO 97-103 MG Take 1 tablet by mouth daily. Patient not taking: Reported on 09/15/2021 04/14/21   [provider]  furosemide (LASIX) 20 MG tablet Take 20 mg by mouth daily. Patient not taking: Reported on 09/15/2021 08/14/21   [provider]  hydrochlorothiazide (HYDRODIURIL) 12.5 MG tablet Take 12.5 mg by mouth daily. Patient not taking: Reported on 09/15/2021    [provider]  metoprolol succinate (TOPROL-XL) 100 MG 24 hr tablet Take by mouth daily. Patient not taking: Reported on 09/15/2021 05/26/20   [provider]  nitroGLYCERIN (NITROSTAT) 0.4 MG SL tablet Place under the tongue. 02/22/19   [provider]  traZODone (DESYREL) 50 MG tablet Take by  mouth. 03/12/20   [provider]  valsartan (DIOVAN) 80 MG tablet Take by mouth. Patient not taking: Reported on 09/15/2021 07/08/21   [provider]  Scheduled Meds:  allopurinol  300 mg Oral Daily   apixaban  5 mg Oral BID   atorvastatin  20 mg Oral Daily   Chlorhexidine Gluconate Cloth  6 each Topical Q0600   mirtazapine  15 mg Oral QHS   nicotine  21 mg Transdermal Daily   pantoprazole  40 mg Oral Daily   Continuous Infusions:  sodium chloride 75 mL/hr at 09/16/21 0400   sodium chloride Stopped (09/16/21 0023)   sodium chloride Stopped (09/16/21 0023)   ceFEPime (  MAXIPIME) IV 2 g (09/16/21 0240)   lactated ringers Stopped (09/15/21 2056)   phenylephrine (NEO-SYNEPHRINE) Adult infusion 50 mcg/min (09/16/21 0400)   vancomycin     PRN Meds:.acetaminophen, diphenhydrAMINE, traZODone  Active Hospital Problem list     Assessment & Plan:  Sepsis with septic shock due to suspected pneumonia versus cellulitis of right upper arm Lactic: 2.9, Baseline PCT: 1.34,  Initial interventions/workup included: 1.8 L of NS & Cefepime/ Vancomycin/ Metronidazole -Supplemental oxygen as needed, to maintain SpO2 > 90% -F/u cultures, trend lactic/ PCT -Monitor WBC/ fever curve -Continue broad-spectrum antibiotics -Gentle IVF hydration as needed -Pressors for MAP goal >65 -Strict I/O's  Acute on chronic Systolic (last known EF 97% on 09/10/2021) Hypertension Ischemic cardiomyopathy (LVEF 25% on 2021 CMR, 35-40% on echo 2022), CAD s/p 3V CABG 12/12/17,  ICD 02/2020, HLD -Continuous cardiac monitoring -Maintain MAP greater than 65 -IV Lasix as blood pressure and renal function permits; currently on Lasix 20 mg at home -Hold valsartan, metoprolol, Entresto, amlodipine and hydrochlorothiazide due to hypotension and AKI -Cardiology following, appreciate input  Paroxysmal A-fib: Currently in RVR with heart rate in the 130s responded to Amiodarone bous x 1. Previously on amiodarone  with concern for pulmonary toxicity. Currently on metoprolol and Eliquis. Initially Mg low at 1.3, given 2g mg sulfate IV  -Electrolyte repletion, K ~ 4, Mg >2 -Hold Metoprolol in the setting of hypotension -Continue Eliquis   AKI on CKD Stage III Lactic Acidosis Hyponatremia Hypokalemia -Monitor I&O's / urinary output -Follow BMP -Ensure adequate renal perfusion -Avoid nephrotoxic agents as able -Replace electrolytes as indicated   Elevated LFTs: Likely shock liver in the setting of sepsis as above -Ultrasound of right upper quadrant negative -Hepatitis panel pending -Follow CMP  Best practice:  Diet:  NPO Pain/Anxiety/Delirium protocol (if indicated): No VAP protocol (if indicated): Not indicated DVT prophylaxis: Systemic AC GI prophylaxis: H2B Glucose control:  SSI No Central venous access:  N/A Arterial line:  N/A Foley:  Yes, and it is still needed Mobility:  bed rest  PT consulted: N/A Last date of multidisciplinary goals of care discussion [2/23] Code Status:  DNR Disposition: ICU   = Goals of Care = Code Status Order: DNR  Primary Emergency Contact: Shumate (POA),Sharon Patient wishes to pursue ongoing treatment, but concurred that if deteriorated to pulselessness, patient would prefer a natural death as opposed to invasive measures such as CPR and intubation.  Family should be immediately contacted in such situations.  Critical care time: 45 minutes     Rufina Falco, DNP, CCRN, FNP-C, AGACNP-BC Acute Care Nurse Practitioner  [facility] Pager: [phone] [place] [facility] [place]  .

## 2021-09-16 NOTE — Consult Note (Signed)
Cardiology Consultation:   Patient ID: Duane Owens MRN: [id number]; DOB: [birth year]  Admit date: 09/15/2021 Date of Consult: 09/16/2021  PCP:  Duane Marble, MD   [facility] Providers Cardiologist [facility] Physician requesting consult: Dr. Blaine Owens Reason for consult: Atrial fibrillation with RVR  Patient Profile:   Duane Owens is a 81 y.o. male with a hx of ischemic cardiomyopathy, coronary disease, CABG, ejection fraction 35%, paroxysmal atrial fibrillation on anticoagulation, GI bleed recent discharge from [facility] several days ago presenting with weakness, fall, hypertension, paroxysmal atrial fibrillation  History of Present Illness:   Duane Owens presents from home with weakness, fall, shortness of breath Noted by EMS to have narrow complex tachycardia, given amiodarone 150 mg IV bolus on route Records reviewed, recently discharged from [facility], since he has been home feeling very weak, not eating anything, drinking lots of fluids 5 large water bottles a day, otherwise having anorexia, only ate half of 1 hamburger all week. In the ER reported feeling dizzy, febrile 100.5, tachypneic, concerning for SIRS criteria Normal sinus rhythm on EKG in the emergency room, converting to atrial fibrillation since that time with elevated rates 100 up to 130 bpm, hypotensive requiring pressors, nor epi Cultures pending  Lab work reviewed, hemoglobin 8.6, WBC 18 On Eliquis 5 twice daily He received IV fluids in the emergency room for acute on chronic renal failure creatinine 1.83 BUN 34 potassium 3.3 AST 141 ALT 71  Echocardiogram done at [facility] ejection fraction 35% Troponin 134, repeat 143, repeats nontrending Hepatitis panel negative BNP 630      Past Medical History:  Diagnosis Date   Anemia    Atherosclerosis    Cancer (Duane Owens)    prostate   CHF (congestive heart failure) ([place])    Hypercholesteremia    Hyperlipemia    Hypertension    Spinal stenosis    Varicose vein     Venous insufficiency     Past Surgical History:  Procedure Laterality Date   BACK SURGERY     CYSTOSCOPY WITH DIRECT VISION INTERNAL URETHROTOMY N/A 02/02/2016   Procedure: CYSTOSCOPY WITH DIRECT VISION INTERNAL URETHROTOMY;  Surgeon: Royston Cowper, MD;  Location: [facility] ORS;  Service: Urology;  Laterality: N/A;   HOLMIUM LASER APPLICATION N/A 9/89/2119   Procedure: HOLMIUM LASER APPLICATION;  Surgeon: Royston Cowper, MD;  Location: [facility] ORS;  Service: Urology;  Laterality: N/A;   PERIPHERAL VASCULAR CATHETERIZATION N/A 08/11/2015   Procedure: Abdominal Aortogram w/Lower Extremity;  Surgeon: Katha Cabal, MD;  Location: [place] [facility];  Service: Cardiovascular;  Laterality: N/A;   PERIPHERAL VASCULAR CATHETERIZATION  08/11/2015   Procedure: Lower Extremity Intervention;  Surgeon: Katha Cabal, MD;  Location: [place] [facility];  Service: Cardiovascular;;   TONSILLECTOMY       Home Medications:  Prior to Admission medications   Medication Sig Start Date End Date Taking? Authorizing Provider  allopurinol (ZYLOPRIM) 300 MG tablet Take 300 mg by mouth daily.   Yes [provider]  amLODipine (NORVASC) 10 MG tablet Take 10 mg by mouth daily. 06/16/20  Yes [provider]  apixaban (ELIQUIS) 5 MG TABS tablet Take 5 mg by mouth 2 (two) times daily.   Yes [provider]  atorvastatin (LIPITOR) 20 MG tablet Take 20 mg by mouth daily.   Yes [provider]  metoprolol succinate (TOPROL-XL) 50 MG 24 hr tablet Take 50 mg by mouth daily. 09/13/21  Yes [provider]  mirtazapine (REMERON) 15 MG tablet Mirtazapine  15 MG Oral Tablet QTY: 90 each Days: 90 Refills: 0  Written: 06/05/20 Patient Instructions: TAKE 1 TABLET BY MOUTH AT BEDTIME 06/05/20  Yes [provider]  MITIGARE 0.6 MG CAPS Take 0.3 mg by mouth daily. 04/21/21  Yes [provider]  pantoprazole (PROTONIX) 40 MG tablet Take 40 mg by mouth daily. 06/26/21  Yes  [provider]  ENTRESTO 97-103 MG Take 1 tablet by mouth daily. Patient not taking: Reported on 09/15/2021 04/14/21   [provider]  furosemide (LASIX) 20 MG tablet Take 20 mg by mouth daily. Patient not taking: Reported on 09/15/2021 08/14/21   [provider]  hydrochlorothiazide (HYDRODIURIL) 12.5 MG tablet Take 12.5 mg by mouth daily. Patient not taking: Reported on 09/15/2021    [provider]  metoprolol succinate (TOPROL-XL) 100 MG 24 hr tablet Take by mouth daily. Patient not taking: Reported on 09/15/2021 05/26/20   [provider]  nitroGLYCERIN (NITROSTAT) 0.4 MG SL tablet Place under the tongue. 02/22/19   [provider]  traZODone (DESYREL) 50 MG tablet Take by mouth. 03/12/20   [provider]  valsartan (DIOVAN) 80 MG tablet Take by mouth. Patient not taking: Reported on 09/15/2021 07/08/21   [provider]    Inpatient Medications: Scheduled Meds:  allopurinol  300 mg Oral Daily   apixaban  5 mg Oral BID   atorvastatin  20 mg Oral Daily   Chlorhexidine Gluconate Cloth  6 each Topical Q0600   mirtazapine  15 mg Oral QHS   nicotine  21 mg Transdermal Daily   pantoprazole  40 mg Oral Daily   predniSONE  10 mg Oral Q breakfast   Continuous Infusions:  sodium chloride Stopped (09/16/21 0023)   sodium chloride Stopped (09/16/21 0023)   ceFEPime (MAXIPIME) IV Stopped (09/16/21 0538)   phenylephrine (NEO-SYNEPHRINE) Adult infusion 25 mcg/min (09/16/21 1233)   vancomycin     PRN Meds: acetaminophen, diphenhydrAMINE, traZODone  Allergies:    Allergies  Allergen Reactions   Amiodarone Other (See Comments)    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  Would consider alternative agents if possible.    Social History:   Social History   Socioeconomic History   Marital status: Widowed    Spouse name: Not on file   Number of children: Not on file   Years of  education: Not on file   Highest education level: Not on file  Occupational History   Not on file  Tobacco Use   Smoking status: Every Day    Packs/day: 0.50    Years: 5.00    Pack years: 2.50    Types: Cigarettes   Smokeless tobacco: Never   Tobacco comments:    Has called [place] [facility] and ordered nicotene patches. Will Quit when patches start  Vaping Use   Vaping Use: Never used  Substance and Sexual Activity   Alcohol use: No   Drug use: No   Sexual activity: Not on file  Other Topics Concern   Not on file  Social History Narrative   Not on file   Social Determinants of Health   Financial Resource Strain: Not on file  Food Insecurity: Not on file  Transportation Needs: Not on file  Physical Activity: Not on file  Stress: Not on file  Social Connections: Not on file  Intimate Partner Violence: Not on file    Family History:    Family History  Problem Relation Age of Onset   Heart failure  Mother    Diabetes Brother    COPD Brother      ROS:  Please see the history of present illness.  Review of Systems  Constitutional:  Positive for malaise/fatigue.  HENT: Negative.    Respiratory:  Positive for shortness of breath.   Cardiovascular: Negative.   Gastrointestinal: Negative.   Musculoskeletal: Negative.   Neurological:  Positive for tremors.  Psychiatric/Behavioral: Negative.    All other systems reviewed and are negative.   Physical Exam/Data:   Vitals:   09/16/21 1100 09/16/21 1130 09/16/21 1200 09/16/21 1230  BP: 116/62 (!) 112/52 (!) 103/47 (!) 91/41  Pulse: (!) 165 82 90 75  Resp: (!) 25 (!) 27 (!) 27 (!) 23  Temp:      TempSrc:      SpO2: 97% 100% 95% 99%  Weight:      Height:        Intake/Output Summary (Last 24 hours) at 09/16/2021 1333 Last data filed at 09/16/2021 1233 Gross per 24 hour  Intake 4926.17 ml  Output 605 ml  Net 4321.17 ml   Last 3 Weights 09/16/2021 09/15/2021 08/13/2020  Weight (lbs) 182 lb 12.2 oz 180 lb 188 lb 11.2 oz   Weight (kg) 82.9 kg 81.647 kg 85.594 kg     Body mass index is 24.79 kg/m.  General: Ill-appearing, cachectic, tremor HEENT: normal Neck: no JVD Vascular: No carotid bruits; Distal pulses 2+ bilaterally Cardiac: Irregularly irregular, rapid Lungs:  clear to auscultation bilaterally, no wheezing, rhonchi or rales  Abd: soft, nontender, no hepatomegaly  Ext: no edema Musculoskeletal:  No deformities, BUE and BLE strength normal and equal Skin: warm and dry  Neuro:  CNs 2-12 intact, no focal abnormalities noted Psych:  Normal affect   EKG:  The EKG was personally reviewed and demonstrates:   Normal sinus rhythm Telemetry:  Telemetry was personally reviewed and demonstrates:   Atrial fibrillation with rate 120 beats per minute  Relevant CV Studies: Echocardiogram performed recently [facility] ejection fraction 35%  Laboratory Data:  High Sensitivity Troponin:   Recent Labs  Lab 09/15/21 1554 09/15/21 2354 09/16/21 0144 09/16/21 0632 09/16/21 0738  TROPONINIHS 143* 117* 165* 136* 133*     Chemistry Recent Labs  Lab 09/15/21 1327 09/15/21 1554 09/16/21 0419  NA 133*  --  135  K 3.3*  --  3.8  CL 107  --  113*  CO2 18*  --  18*  GLUCOSE 154*  --  104*  BUN 34*  --  30*  CREATININE 1.83*  --  1.36*  CALCIUM 8.5*  --  7.9*  MG  --  1.3* 1.9  GFRNONAA 37*  --  52*  ANIONGAP 8  --  4*    Recent Labs  Lab 09/15/21 1327  PROT 6.6  ALBUMIN 3.4*  AST 141*  ALT 71*  ALKPHOS 101  BILITOT 1.2   Lipids  Recent Labs  Lab 09/16/21 0419  CHOL 70  TRIG 79  HDL 18*  LDLCALC 36  CHOLHDL 3.9    Hematology Recent Labs  Lab 09/15/21 1327 09/16/21 0419  WBC 18.2* 15.5*  RBC 2.81* 2.63*  HGB 8.6* 7.9*  HCT 26.8* 24.5*  MCV 95.4 93.2  MCH 30.6 30.0  MCHC 32.1 32.2  RDW 16.3* 16.3*  PLT 122* 104*   Thyroid No results for input(s): TSH, FREET4 in the last 168 hours.  BNP Recent Labs  Lab 09/15/21 1327  BNP 630.9*    DDimer No results for input(s): DDIMER  in  the last 168 hours.   Radiology/Studies:  CT HEAD WO CONTRAST (5MM)  Result Date: 09/15/2021 CLINICAL DATA:  Trauma EXAM: CT HEAD WITHOUT CONTRAST TECHNIQUE: Contiguous axial images were obtained from the base of the skull through the vertex without intravenous contrast. RADIATION DOSE REDUCTION: This exam was performed according to the departmental dose-optimization program which includes automated exposure control, adjustment of the mA and/or kV according to patient size and/or use of iterative reconstruction technique. COMPARISON:  None. FINDINGS: Brain: No acute intracranial findings are seen in noncontrast CT brain. Cortical sulci are prominent. Ventricles are not dilated. There is no focal mass effect. Vascular: Unremarkable Skull: Unremarkable. Sinuses/Orbits: There is mild mucosal thickening in the ethmoid and sphenoid sinuses. Other: None IMPRESSION: No acute intracranial findings are seen in noncontrast CT brain. Atrophy. Electronically Signed   By: Elmer Picker M.D.   On: 09/15/2021 20:03   CT HUMERUS RIGHT WO CONTRAST  Result Date: 09/16/2021 CLINICAL DATA:  Foreign body suspected, upper arm, negative x-ray. EXAM: CT OF THE RIGHT HUMERUS WITHOUT CONTRAST TECHNIQUE: Multidetector CT imaging was performed according to the standard protocol. Multiplanar CT image reconstructions were also generated. RADIATION DOSE REDUCTION: This exam was performed according to the departmental dose-optimization program which includes automated exposure control, adjustment of the mA and/or kV according to patient size and/or use of iterative reconstruction technique. COMPARISON:  None. FINDINGS: Bones/Joint/Cartilage No fracture or dislocation. Normal alignment. No joint effusion. Moderate acromioclavicular osteoarthritis. Ligaments Ligaments are suboptimally evaluated by CT. Muscles and Tendons Muscles are normal in bulk and density. No intramuscular hematoma or fluid collection. No radiopaque foreign body.  Soft tissue No fluid collection or hematoma.  No soft tissue mass. IMPRESSION: 1.  No acute osseous abnormality. 2. Muscles and subcutaneous soft tissues are within normal limits. No evidence of fluid collection or abscess. No radiopaque foreign body. Electronically Signed   By: Keane Police D.O.   On: 09/16/2021 13:25   US RENAL  Result Date: 09/15/2021 CLINICAL DATA:  Renal dysfunction EXAM: RENAL / URINARY TRACT ULTRASOUND COMPLETE COMPARISON:  None. FINDINGS: Right Kidney: Renal measurements: 11.1 x 5.5 x 6.9 cm = volume: 219.6 mL. Echogenicity within normal limits. No mass or hydronephrosis visualized. Left Kidney: Renal measurements: 11.2 x 6.1 x 4.8 cm = volume: 171.5 mL. There is no hydronephrosis. There is 1.2 cm cyst in the left renal cortex. Cortical echogenicity is unremarkable. Bladder: Foley catheter is seen in the bladder. Bladder is empty limiting evaluation. Other: None. IMPRESSION: There is no hydronephrosis. Cortical echogenicity in the kidneys is unremarkable. 1.2 cm left renal cyst. Electronically Signed   By: Elmer Picker M.D.   On: 09/15/2021 18:13   US Venous Img Upper Uni Right(DVT)  Result Date: 09/16/2021 CLINICAL DATA:  Right upper extremity swelling Assess for DVT EXAM: RIGHT UPPER EXTREMITY VENOUS DOPPLER ULTRASOUND TECHNIQUE: Gray-scale sonography with graded compression, as well as color Doppler and duplex ultrasound were performed to evaluate the upper extremity deep venous system from the level of the subclavian vein and including the jugular, axillary, basilic, radial, ulnar and upper cephalic vein. Spectral Doppler was utilized to evaluate flow at rest and with distal augmentation maneuvers. COMPARISON:  None. FINDINGS: Contralateral Subclavian Vein: Respiratory phasicity is normal and symmetric with the symptomatic side. No evidence of thrombus. Normal compressibility. Internal Jugular Vein: No evidence of thrombus. Normal compressibility, respiratory phasicity and  response to augmentation. Subclavian Vein: No evidence of thrombus. Normal compressibility, respiratory phasicity and response to augmentation. Axillary Vein: No  evidence of thrombus. Normal compressibility, respiratory phasicity and response to augmentation. Cephalic Vein: Occlusive thrombus noted in the cephalic vein in the antecubital fossa. Basilic Vein: No evidence of thrombus. Normal compressibility, respiratory phasicity and response to augmentation. Brachial Veins: No evidence of thrombus. Normal compressibility, respiratory phasicity and response to augmentation. Radial Veins: No evidence of thrombus. Normal compressibility, respiratory phasicity and response to augmentation. Ulnar Veins: No evidence of thrombus. Normal compressibility, respiratory phasicity and response to augmentation. Other Findings:  None visualized. IMPRESSION: 1. Superficial venous thrombosis of the right cephalic vein at the level of the antecubital fossa. 2. No right upper extremity DVT. Electronically Signed   By: Miachel Roux M.D.   On: 09/16/2021 12:35   DG Chest Portable 1 View  Result Date: 09/15/2021 CLINICAL DATA:  Weakness, fall. EXAM: PORTABLE CHEST 1 VIEW COMPARISON:  Chest radiograph dated 05/06/2008. FINDINGS: The heart size is normal. Vascular calcifications are seen in the aortic arch. Mild diffuse bilateral interstitial opacities likely represent pulmonary edema. There is no pleural effusion or pneumothorax. Degenerative changes are seen in the spine. A left subclavian approach cardiac device is noted. Median sternotomy wires are intact and well aligned defibrillator pads overlie the chest. IMPRESSION: Mild diffuse bilateral interstitial opacities likely represent pulmonary edema. Aortic Atherosclerosis (ICD10-I70.0). Electronically Signed   By: Zerita Boers M.D.   On: 09/15/2021 13:41   US ABDOMEN LIMITED RUQ (LIVER/GB)  Result Date: 09/15/2021 CLINICAL DATA:  Abnormal liver function tests EXAM: ULTRASOUND  ABDOMEN LIMITED RIGHT UPPER QUADRANT COMPARISON:  None. FINDINGS: Gallbladder: No gallstones or wall thickening visualized. No sonographic Murphy sign noted by sonographer. Common bile duct: Diameter: 3.8 mm Liver: No focal lesion identified. Within normal limits in parenchymal echogenicity. Portal vein is patent on color Doppler imaging with normal direction of blood flow towards the liver. Other: None. IMPRESSION: No sonographic abnormality is seen in the right upper quadrant of abdomen. Electronically Signed   By: Elmer Picker M.D.   On: 09/15/2021 16:05     Assessment and Plan:   Atrial fibrillation with RVR Notes indicating amiodarone intolerance/toxicity secondary to pulmonary fibrosis Has had paroxysmal atrial fibrillation, more persistent the past 12 hours with elevated rate Difficult to treat secondary to hypotension. -Will recommend dose of digoxin 0.25 IV x1.  Poor candidate for continued use of digoxin given underlying renal dysfunction, acute on chronic -Attempting to wean off pressors -Potentially could consider low-dose pressors or midodrine with the addition of metoprolol to tartrate if additional rate control needed -We will try to avoid amiodarone if possible given prior lung toxicity per records -Not a great candidate for other antiarrhythmics Not a good candidate for diltiazem given cardiomyopathy and hypotension Continue Eliquis 5 twice daily with close monitoring of hemoglobin -As outpatient treated with Eliquis and metoprolol succinate 50 daily Addendum: Converting to normal sinus rhythm early this afternoon at time of this note  Coronary artery disease, history of CABG Nonischemic cardiomyopathy ejection fraction 35% Mildly elevated troponin consistent with demand ischemia, nontrending No further ischemic work-up needed at this time  Weakness/anorexia Recent hospital admission February 16 with discharge February 20 [facility]  Cardiomyopathy Dating back many years,  ejection fraction 20% in 2019 in the setting of multivessel disease and ischemic cardiomyopathy, has ICD Most recent echocardiogram ejection fraction of 35 to 40% at [facility] Valsartan, HCTZ, Lasix held at time of discharge September 13, 2021 pending cardiology follow-up at [facility]  Anemia Hemoglobin 8.6 on arrival, down to 7.9, Poor diet, renal dysfunction, Recent admission [facility]  in February for anemia, melena Plan was for GI work-up outpatient, endoscopy given lack of bleeding He was treated with PPI at [facility], given iron infusions --- Would recommend collecting stool, close monitoring of hemoglobin, low threshold for transfusion  Leukocytosis WBC 18 on arrival, repeat 15 cultures pending Possibly dilutional    Total encounter time more than 110 minutes  Greater than 50% was spent in counseling and coordination of care with the patient   For questions or updates, please contact [facility] Please consult [email or web address] for contact info under    Signed, Ida Rogue, MD  09/16/2021 1:33 PM

## 2021-09-16 NOTE — Care Plan (Signed)
This patient is under ICU care.  Patient is requiring Levophed support for hypotension.

## 2021-09-16 NOTE — Consult Note (Signed)
Pharmacy Antibiotic Note  Duane Owens is a 81 y.o. male admitted on 09/15/2021 with sepsis secondary to cellulitis of right upper arm thought to be precipitated by infected IV site from recent hospitalization.  Pharmacy has been consulted for vancomycin and cefepime dosing.  Pending imaging to further characterize SSTI  Plan:  Cefepime 2g IV q12h  Vancomycin 1.25 g IV q24h Goal AUC 400-550  Est AUC: 484, Cmin: 12.3 Daily Scr per protocol Levels at steady state as clinically indicated  Height: 6' (182.9 cm) Weight: 82.9 kg (182 lb 12.2 oz) IBW/kg (Calculated) : 77.6  Temp (24hrs), Avg:99.8 F (37.7 C), Min:98.7 F (37.1 C), Max:101.8 F (38.8 C)  Recent Labs  Lab 09/15/21 1327 09/15/21 1332 09/15/21 1554 09/15/21 2357 09/16/21 0419  WBC 18.2*  --   --   --  15.5*  CREATININE 1.83*  --   --   --  1.36*  LATICACIDVEN  --  2.9* 2.2* 1.3  --      Estimated Creatinine Clearance: 46.8 mL/min (A) (by C-G formula based on SCr of 1.36 mg/dL (H)).    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  Would consider alternative agents if possible.    Antimicrobials this admission: Vancomycin  (2/22 >>  Cefepime (2/22 >>  Metronidazole x1 (2/22)  Dose adjustments this admission: CTM and adjust PRN  Microbiology results: 2/22 BCx: NGTD 2/22 UCx: pending  2/22 MRSA PCR: (-)  Thank you for allowing pharmacy to be a part of this patients care.  Benita Gutter  09/16/2021 11:19 AM

## 2021-09-16 NOTE — Plan of Care (Signed)
°  Problem: Education: Goal: Knowledge of General Education information will improve Description: Including pain rating scale, medication(s)/side effects and non-pharmacologic comfort measures Outcome: Progressing   Problem: Health Behavior/Discharge Planning: Goal: Ability to manage health-related needs will improve Outcome: Progressing   Problem: Clinical Measurements: Goal: Ability to maintain clinical measurements within normal limits will improve Outcome: Progressing Goal: Will remain free from infection Outcome: Progressing Goal: Diagnostic test results will improve Outcome: Progressing Goal: Respiratory complications will improve Outcome: Progressing Goal: Cardiovascular complication will be avoided Outcome: Progressing   Problem: Nutrition: Goal: Adequate nutrition will be maintained Outcome: Progressing   Problem: Coping: Goal: Level of anxiety will decrease Outcome: Progressing   Problem: Elimination: Goal: Will not experience complications related to bowel motility Outcome: Progressing Goal: Will not experience complications related to urinary retention Outcome: Progressing   Problem: Pain Managment: Goal: General experience of comfort will improve Outcome: Progressing   Problem: Safety: Goal: Ability to remain free from injury will improve Outcome: Progressing   Problem: Skin Integrity: Goal: Risk for impaired skin integrity will decrease Outcome: Progressing  Patient on pressors so unable to get up at this time

## 2021-09-17 ENCOUNTER — Inpatient Hospital Stay: Payer: Medicare HMO

## 2021-09-17 DIAGNOSIS — J189 Pneumonia, unspecified organism: Secondary | ICD-10-CM

## 2021-09-17 DIAGNOSIS — L03113 Cellulitis of right upper limb: Secondary | ICD-10-CM

## 2021-09-17 DIAGNOSIS — I480 Paroxysmal atrial fibrillation: Secondary | ICD-10-CM

## 2021-09-17 DIAGNOSIS — E872 Acidosis, unspecified: Secondary | ICD-10-CM

## 2021-09-17 DIAGNOSIS — T801XXA Vascular complications following infusion, transfusion and therapeutic injection, initial encounter: Secondary | ICD-10-CM

## 2021-09-17 DIAGNOSIS — R6521 Severe sepsis with septic shock: Secondary | ICD-10-CM

## 2021-09-17 DIAGNOSIS — A419 Sepsis, unspecified organism: Secondary | ICD-10-CM | POA: Diagnosis not present

## 2021-09-17 DIAGNOSIS — N1831 Chronic kidney disease, stage 3a: Secondary | ICD-10-CM | POA: Diagnosis not present

## 2021-09-17 DIAGNOSIS — I809 Phlebitis and thrombophlebitis of unspecified site: Secondary | ICD-10-CM

## 2021-09-17 DIAGNOSIS — R778 Other specified abnormalities of plasma proteins: Secondary | ICD-10-CM | POA: Diagnosis not present

## 2021-09-17 DIAGNOSIS — N171 Acute kidney failure with acute cortical necrosis: Secondary | ICD-10-CM | POA: Diagnosis not present

## 2021-09-17 LAB — BASIC METABOLIC PANEL
Anion gap: 5 (ref 5–15)
BUN: 29 mg/dL — ABNORMAL HIGH (ref 8–23)
CO2: 16 mmol/L — ABNORMAL LOW (ref 22–32)
Calcium: 7.9 mg/dL — ABNORMAL LOW (ref 8.9–10.3)
Chloride: 115 mmol/L — ABNORMAL HIGH (ref 98–111)
Creatinine, Ser: 1.12 mg/dL (ref 0.61–1.24)
GFR, Estimated: >60 mL/min (ref >60)
Glucose, Bld: 112 mg/dL — ABNORMAL HIGH (ref 70–99)
Potassium: 3.9 mmol/L (ref 3.5–5.1)
Sodium: 136 mmol/L (ref 135–145)

## 2021-09-17 LAB — CBC WITH DIFFERENTIAL/PLATELET
Abs Immature Granulocytes: 0.06 10*3/uL (ref 0.00–0.07)
Basophils Absolute: 0 10*3/uL (ref 0.0–0.1)
Basophils Relative: 0 %
Eosinophils Absolute: 0 10*3/uL (ref 0.0–0.5)
Eosinophils Relative: 0 %
HCT: 22.5 % — ABNORMAL LOW (ref 39.0–52.0)
Hemoglobin: 7.6 g/dL — ABNORMAL LOW (ref 13.0–17.0)
Immature Granulocytes: 1 %
Lymphocytes Relative: 12 %
Lymphs Abs: 1.2 10*3/uL (ref 0.7–4.0)
MCH: 31.1 pg (ref 26.0–34.0)
MCHC: 33.8 g/dL (ref 30.0–36.0)
MCV: 92.2 fL (ref 80.0–100.0)
Monocytes Absolute: 1 10*3/uL (ref 0.1–1.0)
Monocytes Relative: 10 %
Neutro Abs: 7.9 10*3/uL — ABNORMAL HIGH (ref 1.7–7.7)
Neutrophils Relative %: 77 %
Platelets: 118 10*3/uL — ABNORMAL LOW (ref 150–400)
RBC: 2.44 MIL/uL — ABNORMAL LOW (ref 4.22–5.81)
RDW: 16.4 % — ABNORMAL HIGH (ref 11.5–15.5)
WBC: 10.2 10*3/uL (ref 4.0–10.5)
nRBC: 0 % (ref 0.0–0.2)

## 2021-09-17 LAB — IRON AND TIBC
Iron: 50 ug/dL (ref 45–182)
Saturation Ratios: 36 % (ref 17.9–39.5)
TIBC: 139 ug/dL — ABNORMAL LOW (ref 250–450)
UIBC: 89 ug/dL

## 2021-09-17 LAB — HEPATIC FUNCTION PANEL
ALT: 55 U/L — ABNORMAL HIGH (ref 0–44)
AST: 88 U/L — ABNORMAL HIGH (ref 15–41)
Albumin: 2.5 g/dL — ABNORMAL LOW (ref 3.5–5.0)
Alkaline Phosphatase: 75 U/L (ref 38–126)
Bilirubin, Direct: 0.2 mg/dL (ref 0.0–0.2)
Indirect Bilirubin: 0.6 mg/dL (ref 0.3–0.9)
Total Bilirubin: 0.8 mg/dL (ref 0.3–1.2)
Total Protein: 5.3 g/dL — ABNORMAL LOW (ref 6.5–8.1)

## 2021-09-17 LAB — BLOOD CULTURE ID PANEL (REFLEXED) - BCID2

## 2021-09-17 LAB — URINE CULTURE: Culture: NO GROWTH

## 2021-09-17 LAB — PHOSPHORUS: Phosphorus: 2 mg/dL — ABNORMAL LOW (ref 2.5–4.6)

## 2021-09-17 LAB — FERRITIN: Ferritin: 1637 ng/mL — ABNORMAL HIGH (ref 24–336)

## 2021-09-17 LAB — PROCALCITONIN: Procalcitonin: 0.95 ng/mL

## 2021-09-17 LAB — HEMOGLOBIN A1C
Hgb A1c MFr Bld: 5.6 % (ref 4.8–5.6)
Mean Plasma Glucose: 114 mg/dL

## 2021-09-17 LAB — VITAMIN B12: Vitamin B-12: 294 pg/mL (ref 180–914)

## 2021-09-17 LAB — MAGNESIUM: Magnesium: 2.4 mg/dL (ref 1.7–2.4)

## 2021-09-17 MED ORDER — SODIUM BICARBONATE 650 MG PO TABS
1300.0000 mg | ORAL_TABLET | Freq: Two times a day (BID) | ORAL | Status: DC
  Administered 2021-09-17 (×2): 1300 mg via ORAL
  Filled 2021-09-17 (×3): qty 2

## 2021-09-17 MED ORDER — CEFAZOLIN SODIUM-DEXTROSE 2-4 GM/100ML-% IV SOLN
2.0000 g | Freq: Three times a day (TID) | INTRAVENOUS | Status: DC
  Administered 2021-09-17 – 2021-09-18 (×3): 2 g via INTRAVENOUS
  Filled 2021-09-17 (×6): qty 100

## 2021-09-17 MED ORDER — CEFAZOLIN SODIUM-DEXTROSE 2-4 GM/100ML-% IV SOLN
2.0000 g | Freq: Three times a day (TID) | INTRAVENOUS | Status: DC
  Administered 2021-09-17: 2 g via INTRAVENOUS
  Filled 2021-09-17 (×3): qty 100

## 2021-09-17 MED ORDER — METOPROLOL TARTRATE 25 MG PO TABS
25.0000 mg | ORAL_TABLET | Freq: Four times a day (QID) | ORAL | Status: DC
Start: 2021-09-17 — End: 2021-09-18
  Administered 2021-09-17 – 2021-09-18 (×5): 25 mg via ORAL
  Filled 2021-09-17 (×5): qty 1

## 2021-09-17 MED ORDER — POTASSIUM PHOSPHATES 15 MMOLE/5ML IV SOLN
15.0000 mmol | Freq: Once | INTRAVENOUS | Status: AC
  Administered 2021-09-17: 15 mmol via INTRAVENOUS
  Filled 2021-09-17: qty 5

## 2021-09-17 MED ORDER — CYANOCOBALAMIN 1000 MCG/ML IJ SOLN
1000.0000 ug | Freq: Once | INTRAMUSCULAR | Status: AC
Start: 2021-09-17 — End: 2021-09-17
  Administered 2021-09-17: 1000 ug via INTRAMUSCULAR
  Filled 2021-09-17: qty 1

## 2021-09-17 NOTE — Consult Note (Signed)
Pharmacy Antibiotic Note  Duane Owens is a 81 y.o. male admitted on 09/15/2021 with sepsis secondary to cellulitis of right upper arm thought to be precipitated by infected IV site from recent hospitalization.  Pharmacy has been consulted for vancomycin and cefepime dosing.  Pending imaging to further characterize SSTI  Based on initial BCID results, transition Cefepime to Cefazolin, but continuing Vancomycin at this time.   Plan:  Cefazolin 2 gm q8hr for bacteremia and current renal fxn.   Vancomycin 1.25 g IV q24h Goal AUC 400-550  Est AUC: 484, Cmin: 12.3 Daily Scr per protocol Levels at steady state as clinically indicated  Height: 6' (182.9 cm) Weight: 82.9 kg (182 lb 12.2 oz) IBW/kg (Calculated) : 77.6  Temp (24hrs), Avg:98.6 F (37 C), Min:98.2 F (36.8 C), Max:98.9 F (37.2 C)  Recent Labs  Lab 09/15/21 1327 09/15/21 1332 09/15/21 1554 09/15/21 2357 09/16/21 0419  WBC 18.2*  --   --   --  15.5*  CREATININE 1.83*  --   --   --  1.36*  LATICACIDVEN  --  2.9* 2.2* 1.3  --      Estimated Creatinine Clearance: 46.8 mL/min (A) (by C-G formula based on SCr of 1.36 mg/dL (H)).    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  Would consider alternative agents if possible.    Antimicrobials this admission: Vancomycin  (2/22 >>  Cefepime (2/22 >> 2/24) Metronidazole x1 (2/22) Cefazolin (2/24 >>  Dose adjustments this admission: CTM and adjust PRN  Microbiology results: 2/22 BCx: 1 (anaerobic) of 4 w/ Staph Aureus, NR 2/22 UCx: pending  2/22 MRSA PCR: (-)  Thank you for allowing pharmacy to be a part of this patients care.  Renda Rolls, PharmD, MBA 09/17/2021 1:00 AM

## 2021-09-17 NOTE — Plan of Care (Signed)

## 2021-09-17 NOTE — Progress Notes (Signed)
PHARMACY - PHYSICIAN COMMUNICATION CRITICAL VALUE ALERT - BLOOD CULTURE IDENTIFICATION (BCID)  BCID results:  1 (anaerobic) bottle of 4 with Staph Aureus, no resistance.  Pt currently on Cefepime and Vancomycin.   Name of provider contacted: B Rust-Chester, NP  Changes to prescribed antibiotics required: Switch Cefepime to Cefazolin, but continue Vancomycin at this time.  Renda Rolls, PharmD, [facility] 09/17/2021 12:47 AM

## 2021-09-17 NOTE — Consult Note (Addendum)
PHARMACY CONSULT NOTE   Pharmacy Consult for Electrolyte Monitoring and Replacement   Recent Labs: Potassium (mmol/L)  Date Value  09/17/2021 3.9   Magnesium (mg/dL)  Date Value  09/17/2021 2.4   Calcium (mg/dL)  Date Value  09/17/2021 7.9 (L)   Albumin (g/dL)  Date Value  09/15/2021 3.4 (L)   Phosphorus (mg/dL)  Date Value  09/17/2021 2.0 (L)   Sodium (mmol/L)  Date Value  09/17/2021 136   Assessment: Patient is an 81 y/o M with medical history including Afib on Eliquis, HTN, HLD, depression, varicose veins, systolic CHF, prostate cancer s/p radiation therapy, anemia, chronic pain, CAD, pacemaker placement, CKD, recent hospital encounter on 09/09/21 for melena who is admitted with sepsis suspected to be secondary to cellulitis secondary to infected IV access from recent hospital encounter. Pharmacy consulted to assist with electrolyte monitoring and replacement as indicated.  Goal of Therapy:  K >=4 Mg >= 2 All other electrolytes within normal limits  Plan:  --AKI continues to resolve. Patient with a non-anion gap metabolic acidosis possibly contributed to by hyperchloremia. Started on PO bicarbonate tabs per primary team --Phosphorous 2, IV potassium phosphate 15 mmol x 1 per PCCM --Patient care transferred from PCCM to [facility]. Will discontinue electrolyte consult at this time. Defer further ordering of labs and electrolyte replacement to primary team --Pharmacy will continue to follow along peripherally  Benita Gutter 09/17/2021 7:32 AM

## 2021-09-17 NOTE — Consult Note (Signed)
Pharmacy Antibiotic Note  Duane Owens is a 81 y.o. male admitted on 09/15/2021 with sepsis secondary to cellulitis of right upper arm thought to be precipitated by infected IV site from recent hospitalization.  Patient initially started on broad spectrum coverage with vancomycin and cefepime dosing, however, BCID detected SAB with no methicillin resistance genes. Pharmacy now consulted to narrow to Ancef. ID automatically consulted to SAB.   Plan:  Cefazolin 2 g q8h for SAB  Continue to follow-up further work-up per ID and including duration of therapy, final cultures and sensitivities, source control  Height: 6' (182.9 cm) Weight: 83.3 kg (183 lb 10.3 oz) IBW/kg (Calculated) : 77.6  Temp (24hrs), Avg:98.2 F (36.8 C), Min:98 F (36.7 C), Max:98.4 F (36.9 C)  Recent Labs  Lab 09/15/21 1327 09/15/21 1332 09/15/21 1554 09/15/21 2357 09/16/21 0419 09/17/21 0415  WBC 18.2*  --   --   --  15.5* 10.2  CREATININE 1.83*  --   --   --  1.36* 1.12  LATICACIDVEN  --  2.9* 2.2* 1.3  --   --      Estimated Creatinine Clearance: 56.8 mL/min (by C-G formula based on SCr of 1.12 mg/dL).    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  Would consider alternative agents if possible.    Antimicrobials this admission: Vancomycin  2/22 >> 2/23 Cefepime 2/22 >> 2/23 Metronidazole x1 2/22 Cefazolin 2/24 >>   Dose adjustments this admission: CTM and adjust PRN  Microbiology results: 2/22 BCx: 1 of 4 w/ Staphylococcus aureus, NR 2/22 UCx: pending  2/22 MRSA PCR: (-)  Thank you for allowing pharmacy to be a part of this patients care.  Benita Gutter  09/17/2021 7:37 AM

## 2021-09-17 NOTE — Evaluation (Signed)
Occupational Therapy Evaluation Patient Details Name: Duane Owens MRN: [id number] DOB: 03/21/1941 Today's Date: 09/17/2021   History of Present Illness 81 y.o. male with medical history significant of a fib on Eliquis, hypertension, hyperlipidemia, GERD, gout, depression, varicose vein, sCHF with EF of 35%, prostate cancer (s/p of radiation therapy), anemia, chronic pain, thrombocytopenia, CAD, pacemaker placement, CKD-3, who present fall and weakness   Clinical Impression   Patient presenting with decreased Ind in self care, balance, functional mobility/transfers, endurance, and safety awareness. Patient reports living with family at baseline and being independent in aspects of self care and IADLS. HE endorses that his 55 y/o granddaughter will be home during the day with him at discharge while daughter is working. Pt performing self care tasks and functional mobility with min HHA this session. Pt does fatigue quickly and would likely benefit from using his RW at home for safety.Patient will benefit from acute OT to increase overall independence in the areas of ADLs, functional mobility, and safety awareness in order to safely discharge home with family.      Recommendations for follow up therapy are one component of a multi-disciplinary discharge planning process, led by the attending physician.  Recommendations may be updated based on patient status, additional functional criteria and insurance authorization.   Follow Up Recommendations  Home health OT    Assistance Recommended at Discharge Intermittent Supervision/Assistance  Patient can return home with the following A little help with walking and/or transfers;A little help with bathing/dressing/bathroom;Assistance with cooking/housework;Assist for transportation;Help with stairs or ramp for entrance    Functional Status Assessment  Patient has had a recent decline in their functional status and demonstrates the ability to make  significant improvements in function in a reasonable and predictable amount of time.  Equipment Recommendations  None recommended by OT       Precautions / Restrictions Precautions Precautions: Fall      Mobility Bed Mobility Overal bed mobility: Needs Assistance Bed Mobility: Supine to Sit     Supine to sit: Min assist     General bed mobility comments: assist for trunk support and min cuing for technique    Transfers Overall transfer level: Needs assistance Equipment used: 1 person hand held assist Transfers: Sit to/from Stand, Bed to chair/wheelchair/BSC Sit to Stand: Min assist     Step pivot transfers: Min assist            Balance Overall balance assessment: Needs assistance Sitting-balance support: Feet supported, Bilateral upper extremity supported Sitting balance-Leahy Scale: Good     Standing balance support: During functional activity Standing balance-Leahy Scale: Fair                             ADL either performed or assessed with clinical judgement   ADL Overall ADL's : Needs assistance/impaired     Grooming: Wash/dry hands;Wash/dry face;Sitting;Set up           Upper Body Dressing : Minimal assistance;Sitting       Toilet Transfer: Minimal assistance Toilet Transfer Details (indicate cue type and reason): simulated         Functional mobility during ADLs: Minimal assistance       Vision Patient Visual Report: No change from baseline              Pertinent Vitals/Pain Pain Assessment Pain Assessment: No/denies pain     Hand Dominance Right   Extremity/Trunk Assessment Upper Extremity Assessment Upper Extremity Assessment:  Generalized weakness   Lower Extremity Assessment Lower Extremity Assessment: Generalized weakness       Communication Communication Communication: No difficulties   Cognition Arousal/Alertness: Awake/alert Behavior During Therapy: WFL for tasks assessed/performed Overall  Cognitive Status: Within Functional Limits for tasks assessed                                 General Comments: Pt is pleasant and cooperative                Home Living Family/patient expects to be discharged to:: Private residence Living Arrangements: Children Available Help at Discharge: Family;Available 24 hours/day Type of Home: House Home Access: Stairs to enter [employer] of Steps: 2-3   Home Layout: One level     Bathroom Shower/Tub: Tub/shower unit         Home Equipment: Conservation officer, nature (2 wheels);Cane - single point          Prior Functioning/Environment Prior Level of Function : Independent/Modified Independent                        OT Problem List: Decreased strength;Decreased activity tolerance;Impaired balance (sitting and/or standing);Decreased knowledge of use of DME or AE;Decreased safety awareness      OT Treatment/Interventions: Self-care/ADL training;Therapeutic exercise;Therapeutic activities;Energy conservation;DME and/or AE instruction;Patient/family education;Balance training;Manual therapy    OT Goals(Current goals can be found in the care plan section) Acute Rehab OT Goals Patient Stated Goal: to go home OT Goal Formulation: With patient Time For Goal Achievement: 10/01/21 Potential to Achieve Goals: Fair ADL Goals Pt Will Perform Grooming: with modified independence;standing Pt Will Perform Lower Body Dressing: with modified independence;sit to/from stand Pt Will Transfer to Toilet: with modified independence Pt Will Perform Toileting - Clothing Manipulation and hygiene: with modified independence;sit to/from stand  OT Frequency: Min 2X/week       AM-PAC OT "6 Clicks" Daily Activity     Outcome Measure Help from another person eating meals?: None Help from another person taking care of personal grooming?: A Little Help from another person toileting, which includes using toliet, bedpan, or urinal?:  A Little Help from another person bathing (including washing, rinsing, drying)?: A Little Help from another person to put on and taking off regular upper body clothing?: None Help from another person to put on and taking off regular lower body clothing?: A Little 6 Click Score: 20   End of Session Nurse Communication: Mobility status  Activity Tolerance: Patient tolerated treatment well Patient left: in bed;with call bell/phone within reach;with bed alarm set  OT Visit Diagnosis: Unsteadiness on feet (R26.81);Muscle weakness (generalized) (M62.81)                Time: 0865-7846 OT Time Calculation (min): 16 min Charges:  OT General Charges $OT Visit: 1 Visit OT Evaluation $OT Eval Moderate Complexity: 1 Mod OT Treatments $Self Care/Home Management : 8-22 mins  Darleen Crocker, MS, OTR/L , CBIS ascom [phone]  09/17/21, 3:23 PM

## 2021-09-17 NOTE — Progress Notes (Addendum)
Cardiology Progress Note   Patient Name: Duane Owens Date of Encounter: 09/17/2021  Primary Cardiologist: [facility]  Subjective   No chest pain, dyspnea, palpitations.  Recurrent afib yesterday evening w/ rates into the 120's-130's.  Inpatient Medications    Scheduled Meds:  allopurinol  300 mg Oral Daily   apixaban  5 mg Oral BID   atorvastatin  20 mg Oral Daily   Chlorhexidine Gluconate Cloth  6 each Topical Q0600   mirtazapine  15 mg Oral QHS   nicotine  21 mg Transdermal Daily   pantoprazole  40 mg Oral Daily   predniSONE  10 mg Oral Q breakfast   Continuous Infusions:  sodium chloride Stopped (09/16/21 0023)   sodium chloride Stopped (09/16/21 0023)    ceFAZolin (ANCEF) IV Stopped (09/17/21 0455)   potassium PHOSPHATE IVPB (in mmol) 15 mmol (09/17/21 0801)   PRN Meds: acetaminophen, diphenhydrAMINE, traZODone   Vital Signs    Vitals:   09/17/21 0600 09/17/21 0700 09/17/21 0800 09/17/21 0815  BP: (!) 148/64 (!) 131/56 (!) 158/70 137/69  Pulse: 100 (!) 54 (!) 111 (!) 112  Resp: (!) 23 (!) 22 (!) 26 (!) 27  Temp:   98.3 F (36.8 C)   TempSrc:   Oral   SpO2: 100% 99% 100% 100%  Weight:      Height:        Intake/Output Summary (Last 24 hours) at 09/17/2021 0929 Last data filed at 09/17/2021 0900 Gross per 24 hour  Intake 1018.68 ml  Output 1255 ml  Net -236.32 ml   Filed Weights   09/15/21 1329 09/16/21 0126 09/17/21 0310  Weight: 81.6 kg 82.9 kg 83.3 kg    Physical Exam   GEN: thin, in no acute distress.  HEENT: Grossly normal.  Neck: Supple, no JVD, carotid bruits, or masses. Cardiac: IR, IR, tachy, no murmurs, rubs, or gallops. No clubbing, cyanosis, edema.  Radials 2+, DP/PT 2+ and equal bilaterally.  Respiratory:  Respirations regular and unlabored, clear to auscultation bilaterally. GI: Soft, nontender, nondistended, BS + x 4. MS: no deformity or atrophy. Skin: warm and dry, no rash. Neuro:  Strength and sensation are intact. Psych: AAOx3.   Normal affect.  Labs    Chemistry Recent Labs  Lab 09/15/21 1327 09/16/21 0419 09/17/21 0415  NA 133* 135 136  K 3.3* 3.8 3.9  CL 107 113* 115*  CO2 18* 18* 16*  GLUCOSE 154* 104* 112*  BUN 34* 30* 29*  CREATININE 1.83* 1.36* 1.12  CALCIUM 8.5* 7.9* 7.9*  PROT 6.6  --   --   ALBUMIN 3.4*  --   --   AST 141*  --   --   ALT 71*  --   --   ALKPHOS 101  --   --   BILITOT 1.2  --   --   GFRNONAA 37* 52* >60  ANIONGAP 8 4* 5     Hematology Recent Labs  Lab 09/15/21 1327 09/16/21 0419 09/17/21 0415  WBC 18.2* 15.5* 10.2  RBC 2.81* 2.63* 2.44*  HGB 8.6* 7.9* 7.6*  HCT 26.8* 24.5* 22.5*  MCV 95.4 93.2 92.2  MCH 30.6 30.0 31.1  MCHC 32.1 32.2 33.8  RDW 16.3* 16.3* 16.4*  PLT 122* 104* 118*    Cardiac Enzymes  Recent Labs  Lab 09/15/21 1554 09/15/21 2354 09/16/21 0144 09/16/21 0632 09/16/21 0738  TROPONINIHS 143* 117* 165* 136* 133*      BNP Recent Labs  Lab 09/15/21 1327  BNP 630.9*  Lipids  Lab Results  Component Value Date   CHOL 70 09/16/2021   HDL 18 (L) 09/16/2021   LDLCALC 36 09/16/2021   TRIG 79 09/16/2021   CHOLHDL 3.9 09/16/2021    HbA1c  Lab Results  Component Value Date   HGBA1C 5.6 09/15/2021    Radiology    DG Chest 2 View  Result Date: 09/17/2021 CLINICAL DATA:  Sepsis EXAM: CHEST - 2 VIEW COMPARISON:  Radiograph 09/15/2021 FINDINGS: Unchanged cardiomediastinal silhouette with pacemaker/AICD leads. Prior median sternotomy and CABG. Elevated left hemidiaphragm, unchanged. There are mild interstitial opacities. Faint right lower lung opacity and left medial basilar opacities, increased from prior. No large pleural effusion. No visible pneumothorax. No acute osseous abnormality. IMPRESSION: Increased left medial basilar opacities and faint right peripheral lower lung opacity, which could represent atelectasis or developing infection. Electronically Signed   By: Maurine Simmering M.D.   On: 09/17/2021 08:40   CT HEAD WO CONTRAST  (5MM)  Result Date: 09/15/2021 CLINICAL DATA:  Trauma EXAM: CT HEAD WITHOUT CONTRAST TECHNIQUE: Contiguous axial images were obtained from the base of the skull through the vertex without intravenous contrast. RADIATION DOSE REDUCTION: This exam was performed according to the departmental dose-optimization program which includes automated exposure control, adjustment of the mA and/or kV according to patient size and/or use of iterative reconstruction technique. COMPARISON:  None. FINDINGS: Brain: No acute intracranial findings are seen in noncontrast CT brain. Cortical sulci are prominent. Ventricles are not dilated. There is no focal mass effect. Vascular: Unremarkable Skull: Unremarkable. Sinuses/Orbits: There is mild mucosal thickening in the ethmoid and sphenoid sinuses. Other: None IMPRESSION: No acute intracranial findings are seen in noncontrast CT brain. Atrophy. Electronically Signed   By: Elmer Picker M.D.   On: 09/15/2021 20:03   CT HUMERUS RIGHT WO CONTRAST  Result Date: 09/16/2021 CLINICAL DATA:  Foreign body suspected, upper arm, negative x-ray. EXAM: CT OF THE RIGHT HUMERUS WITHOUT CONTRAST TECHNIQUE: Multidetector CT imaging was performed according to the standard protocol. Multiplanar CT image reconstructions were also generated. RADIATION DOSE REDUCTION: This exam was performed according to the departmental dose-optimization program which includes automated exposure control, adjustment of the mA and/or kV according to patient size and/or use of iterative reconstruction technique. COMPARISON:  None. FINDINGS: Bones/Joint/Cartilage No fracture or dislocation. Normal alignment. No joint effusion. Moderate acromioclavicular osteoarthritis. Ligaments Ligaments are suboptimally evaluated by CT. Muscles and Tendons Muscles are normal in bulk and density. No intramuscular hematoma or fluid collection. No radiopaque foreign body. Soft tissue No fluid collection or hematoma.  No soft tissue  mass. IMPRESSION: 1.  No acute osseous abnormality. 2. Muscles and subcutaneous soft tissues are within normal limits. No evidence of fluid collection or abscess. No radiopaque foreign body. Electronically Signed   By: Keane Police D.O.   On: 09/16/2021 13:25   CT FOREARM RIGHT WO CONTRAST  Result Date: 09/16/2021 CLINICAL DATA:  Foreign body suspected, forearm, negative x-ray. Cellulitis of the right arm. EXAM: CT OF THE RIGHT FOREARM WITHOUT CONTRAST TECHNIQUE: Multidetector CT imaging was performed according to the standard protocol. Multiplanar CT image reconstructions were also generated. RADIATION DOSE REDUCTION: This exam was performed according to the departmental dose-optimization program which includes automated exposure control, adjustment of the mA and/or kV according to patient size and/or use of iterative reconstruction technique. COMPARISON:  None. FINDINGS: Bones/Joint/Cartilage No fracture or dislocation. Normal alignment. No joint effusion. Cystic changes about the distal ulna as well as in the lunate and scaphoid. Ligaments Ligaments are suboptimally  evaluated by CT. Muscles and Tendons Muscles are normal in bulk and density. No intramuscular fluid collection or abscess. Tendons of flexor and extensor compartment appear within normal limits. Soft tissue No fluid collection or hematoma.  No soft tissue mass. IMPRESSION: 1. No acute osseous abnormality. Degenerative and cystic changes about the distal ulna and carpal bones. Mild osteoarthritis of the radiocapitellar and ulnar trochlear joints. 2. Muscles and subcutaneous soft tissues are within normal limits. No evidence of fluid collection or abscess. No radiopaque foreign body. Electronically Signed   By: Keane Police D.O.   On: 09/16/2021 13:31   US RENAL  Result Date: 09/15/2021 CLINICAL DATA:  Renal dysfunction EXAM: RENAL / URINARY TRACT ULTRASOUND COMPLETE COMPARISON:  None. FINDINGS: Right Kidney: Renal measurements: 11.1 x 5.5 x 6.9  cm = volume: 219.6 mL. Echogenicity within normal limits. No mass or hydronephrosis visualized. Left Kidney: Renal measurements: 11.2 x 6.1 x 4.8 cm = volume: 171.5 mL. There is no hydronephrosis. There is 1.2 cm cyst in the left renal cortex. Cortical echogenicity is unremarkable. Bladder: Foley catheter is seen in the bladder. Bladder is empty limiting evaluation. Other: None. IMPRESSION: There is no hydronephrosis. Cortical echogenicity in the kidneys is unremarkable. 1.2 cm left renal cyst. Electronically Signed   By: Elmer Picker M.D.   On: 09/15/2021 18:13   US Venous Img Upper Uni Right(DVT)  Result Date: 09/16/2021 CLINICAL DATA:  Right upper extremity swelling Assess for DVT EXAM: RIGHT UPPER EXTREMITY VENOUS DOPPLER ULTRASOUND TECHNIQUE: Gray-scale sonography with graded compression, as well as color Doppler and duplex ultrasound were performed to evaluate the upper extremity deep venous system from the level of the subclavian vein and including the jugular, axillary, basilic, radial, ulnar and upper cephalic vein. Spectral Doppler was utilized to evaluate flow at rest and with distal augmentation maneuvers. COMPARISON:  None. FINDINGS: Contralateral Subclavian Vein: Respiratory phasicity is normal and symmetric with the symptomatic side. No evidence of thrombus. Normal compressibility. Internal Jugular Vein: No evidence of thrombus. Normal compressibility, respiratory phasicity and response to augmentation. Subclavian Vein: No evidence of thrombus. Normal compressibility, respiratory phasicity and response to augmentation. Axillary Vein: No evidence of thrombus. Normal compressibility, respiratory phasicity and response to augmentation. Cephalic Vein: Occlusive thrombus noted in the cephalic vein in the antecubital fossa. Basilic Vein: No evidence of thrombus. Normal compressibility, respiratory phasicity and response to augmentation. Brachial Veins: No evidence of thrombus. Normal  compressibility, respiratory phasicity and response to augmentation. Radial Veins: No evidence of thrombus. Normal compressibility, respiratory phasicity and response to augmentation. Ulnar Veins: No evidence of thrombus. Normal compressibility, respiratory phasicity and response to augmentation. Other Findings:  None visualized. IMPRESSION: 1. Superficial venous thrombosis of the right cephalic vein at the level of the antecubital fossa. 2. No right upper extremity DVT. Electronically Signed   By: Miachel Roux M.D.   On: 09/16/2021 12:35   DG Chest Portable 1 View  Result Date: 09/15/2021 CLINICAL DATA:  Weakness, fall. EXAM: PORTABLE CHEST 1 VIEW COMPARISON:  Chest radiograph dated 05/06/2008. FINDINGS: The heart size is normal. Vascular calcifications are seen in the aortic arch. Mild diffuse bilateral interstitial opacities likely represent pulmonary edema. There is no pleural effusion or pneumothorax. Degenerative changes are seen in the spine. A left subclavian approach cardiac device is noted. Median sternotomy wires are intact and well aligned defibrillator pads overlie the chest. IMPRESSION: Mild diffuse bilateral interstitial opacities likely represent pulmonary edema. Aortic Atherosclerosis (ICD10-I70.0). Electronically Signed   By: Harley Hallmark.D.  On: 09/15/2021 13:41   US ABDOMEN LIMITED RUQ (LIVER/GB)  Result Date: 09/15/2021 CLINICAL DATA:  Abnormal liver function tests EXAM: ULTRASOUND ABDOMEN LIMITED RIGHT UPPER QUADRANT COMPARISON:  None. FINDINGS: Gallbladder: No gallstones or wall thickening visualized. No sonographic Murphy sign noted by sonographer. Common bile duct: Diameter: 3.8 mm Liver: No focal lesion identified. Within normal limits in parenchymal echogenicity. Portal vein is patent on color Doppler imaging with normal direction of blood flow towards the liver. Other: None. IMPRESSION: No sonographic abnormality is seen in the right upper quadrant of abdomen. Electronically  Signed   By: Elmer Picker M.D.   On: 09/15/2021 16:05    Telemetry    Recurrent AFib/flutter since 20:19 on 2/23.  Rates mostly 90's to low 100's w/ some elevations into the 120's - Personally Reviewed  Cardiac Studies   2D Echocardiogram 08/2021 [facility])  Summary    1. The left ventricular systolic function is moderately decreased, LVEF is  visually estimated at 35%.    2. There is grade I diastolic dysfunction (impaired relaxation).    3. There is mild mitral valve regurgitation.    4. The left atrium is mildly dilated in size.    5. The aortic valve is probably trileaflet with mildly thickened leaflets  with probably normal excursion.    6. There is mild aortic regurgitation.    7. The aorta at the sinuses of Valsalva is mildly dilated.    8. The right ventricle is normal in size, with normal systolic function.    9. The right atrium is mildly dilated  in size.  _____________   Patient Profile     81 y.o. male w/ a h/o CAD s/p prior CABG, chronic HFrEF, ICM (EF 35%), PAF on eliquis, GIB, and recent admission to [facility] w/ symptomatic anemia, orthostasis, and AKI, w/ plan for outpt GI w/u, who was admitted 2/22 w/ weakness and fall, w/ AFib RVR noted by EMS, and PAF since admission.  Assessment & Plan    1.  PAF w/ RVR:  H/o PAF on chronic eliquis in outpt setting.  Admitted w/ weakness, fall, LLL HCAP.  Initially noted to be tachycardic by EMS and was treated w/ IV amio in the field (likely afib rvr w/ wide complex in the setting of underlying RBBB).  PAF since arrival, converting to sinus yesterday morning but returning to afib last evening.  Rates elevated @ times.  Asymptomatic.  BPs much improved this AM.  Will resume ? blocker  Was on toprol xl 100 daily and this was reduced to 50 daily at recent d/c from [facility].  For now, w/ elevated rates and BP, will add metoprolol tartrate 25 q6h.  Cont eliquis.  2.  CAD s/p CABG/Demand Ischemia:  Mild HsTrop elevation w/ flat trend in setting  of resp failure/pna/paf (134  143  117  165  136  133).  No chest pain.  Recent echo w/ EF of 35%.  Cont med rx.  Resuming ? blocker.  Cont statin.  No plan for ischemic eval @ this time.  3.  HCAP/sepsis:  abx per IM.  BPs improved.  4.  Chronic HFrEF/ICM:  EF 35% by echo @ [facility] last week.  Euvolemic on exam.  Resuming ? blocker.  Prev on lasix, hctz, and valsartan, all d/c'd at recent hosp d/c from [facility] in setting of AKI.  Follow and consider resumption of ARB if renal fxn/BP allow.  5.  Normocytic Anemia:  recent admission to [facility] w/ anemia  and concern for GIB.  Plan for outpt GI w/u.   H/H 7.6/22.5 this AM.  Would have a low threshold to transfuse in setting of CAD hx/mild HsTrop elevation.  May need to hold [facility] for further drops.  6.  AKI/CKD III:  creat improved this AM.  Signed, Murray Hodgkins, NP  09/17/2021, 9:29 AM    For questions or updates, please contact   Please consult [email or web address] for contact info under Cardiology/STEMI.

## 2021-09-17 NOTE — Progress Notes (Addendum)
Progress Note   Patient: Duane Owens PZW:[id number] DOB: [birth year] DOA: 09/15/2021     2 DOS: the patient was seen and examined on 09/17/2021   Brief hospital course: Duane Owens is a 81 y.o. male with medical history significant of a fib on Eliquis, hypertension, hyperlipidemia, GERD, gout, depression, varicose vein, sCHF with EF of 35%, prostate cancer (s/p of radiation therapy), anemia, chronic pain, thrombocytopenia, CAD, pacemaker placement, CKD-3, who present fall and weakness.  Was recently hospitalized at [facility] on 2/16 for 4 days for dizziness and syncope. Upon arriving the hospital, she had temperature of 100.5, leukocytosis of 1822, elevated PT/INR, elevated troponin 143, elevated AST ALT, creatinine 1.83. He developed hypotension, did not respond to IV fluids.  He was transferred to ICU and placed on pressor.  Assessment and Plan: Septic shock secondary to healthcare associated pneumonia. Healthcare associate pneumonia at the left lower lobe. Right arm mild cellulitis with superficial thrombosis. Patient is currently more stable, will transfer patient to progressive unit with telemetry monitor. Reviewed blood culture results, patient has MSSA in 1 anaerobic bottle.  Negative in aerobic bottle.  Repeated chest x-ray today, showed significant increased left lower lobe infiltrates consistent with healthcare associate pneumonia.  Continue cefazolin.  Acute kidney injury secondary to shock. Elevated liver enzymes secondary to septic shock. Metabolic acidosis secondary to acute kidney injury. Hypophosphatemia. Renal function has normalized.  Patient still has significant metabolic acidosis, will start sodium bicarbonate. Phosphate level 2.0 today, replete phosphate.  Elevated troponin secondary to septic shock Coronary artery disease. Paroxysmal atrial fibrillation with rapid ventricle response. Acute on chronic systolic congestive heart failure. Patient condition has been  improving, currently no hypoxia. Followed by cardiology to address atrial fibrillation.  Severe anemia. Thrombocytopenia. Patient does not have active bleeding. Platelet count is improving.  Still significant anemia with hemoglobin 7.6, reviewed prior lab, patient has borderline B12 level in 2021.  Recheck B12 and iron level.  Give 1 shot of B12.   Subjective:  Patient is doing better today.  He has a mild cough, denies any short of breath, no chest pain. No abdominal pain or nausea vomiting. No fever or chills.  Physical Exam: Vitals:   09/17/21 0600 09/17/21 0700 09/17/21 0800 09/17/21 0815  BP: (!) 148/64 (!) 131/56 (!) 158/70 137/69  Pulse: 100 (!) 54 (!) 111 (!) 112  Resp: (!) 23 (!) 22 (!) 26 (!) 27  Temp:   98.3 F (36.8 C)   TempSrc:   Oral   SpO2: 100% 99% 100% 100%  Weight:      Height:       General exam: Appears calm and comfortable  Respiratory system: Significant crackles in bilateral bases. Respiratory effort normal. Cardiovascular system: Irregularly irregular with tachycardia. No JVD, murmurs, rubs, gallops or clicks. No pedal edema. Gastrointestinal system: Abdomen is nondistended, soft and nontender. No organomegaly or masses felt. Normal bowel sounds heard. Central nervous system: Alert and oriented. No focal neurological deficits. Extremities: Right arm mildly swelling and red. Skin: No rashes, lesions or ulcers Psychiatry: Judgement and insight appear normal. Mood & affect appropriate.    Data Reviewed: Reviewed all labs, prior labs in 2021. Reviewed chest x-ray results and image, compared current to prior x-ray. Reviewed ultrasound of arm result.   Family Communication:   Disposition: Status is: Inpatient Remains inpatient appropriate because: Severity of disease, IV antibiotics.          Planned Discharge Destination:  To be decided     Time spent:  50 minutes  Author: Sharen Hones, MD 09/17/2021 10:15 AM  For on call review  [email or web address].

## 2021-09-17 NOTE — Consult Note (Addendum)
NAME: Duane Owens  DOB: 10/26/1940  MRN: [id number]  Date/Time: 09/17/2021 1:24 PM  REQUESTING PROVIDER: Dr. Roosevelt Locks Subjective:  REASON FOR CONSULT: MSSA bacteremia ?History from patient,is limited  chart reviewed both epic and carelink Duane Owens is a 81 y.o. male with a history of CAD, PAD, atrial fibrillation, amiodarone pulmonary toxicity, AICD ischemic cardiomyopathy HTN, Ca prostate, HLD, Anemia,  presents with weakness and fall  on 09/15/21. He did not have LOC He was recently in [facility] from 09/09/2021 until 09/13/2021 for dark stools, dizziness and symptomatic anemia.  He had a hemoglobin of 9.8 which was down from 13.2.  The other labs like reticulocyte count LDH and haptoglobin were normal.  He had low iron and TIBC and iron sats.  B12 folate were normal.  He was treated with IV PPI and transition to oral PPI.  He also received sodium ferric gluconate iron infusion for 4 doses. He presented to our ED on 09/15/2020 with a fall and weakness.  He is also had dizziness.  No loss of consciousness Chronic shortness of breath.  No active chest pain or cough.  No fever.  He did not have any rectal bleeding or dark stool.  In the ED he had a temperature of 101.8, BP 104 x 59, pulse 134, respiratory 30.  WBC was 18.2, hemoglobin 8.6, platelet 122 and creatinine 1.83.  Blood cultures were sent.  He was found to have thrombophlebitis/cellulitis on the right cubital area at the site of prior IV line. He was started on broad-spectrum antibiotics. He was initially in ICU for hypotension and Afib CT head  did not show any acute changes CT of the right forearm shows no acute osseous abnormality.  Muscle and subcutaneous soft tissues are within normal no evidence of fluid collection or abscess. He was stable and was transferred out to the floor this morning Blood culture came back positive for MSSA 1 of 4 .  And I am seeing the patient for the same..  Past Medical History:  Diagnosis Date   Anemia     Atherosclerosis    Cancer ([place])    prostate   CHF (congestive heart failure) (HCC)    Hypercholesteremia    Hyperlipemia    Hypertension    Spinal stenosis    Varicose vein    Venous insufficiency     Past Surgical History:  Procedure Laterality Date   BACK SURGERY     CYSTOSCOPY WITH DIRECT VISION INTERNAL URETHROTOMY N/A 02/02/2016   Procedure: CYSTOSCOPY WITH DIRECT VISION INTERNAL URETHROTOMY;  Surgeon: Royston Cowper, MD;  Location: [facility] ORS;  Service: Urology;  Laterality: N/A;   HOLMIUM LASER APPLICATION N/A 3/53/2992   Procedure: HOLMIUM LASER APPLICATION;  Surgeon: Royston Cowper, MD;  Location: [facility] ORS;  Service: Urology;  Laterality: N/A;   PERIPHERAL VASCULAR CATHETERIZATION N/A 08/11/2015   Procedure: Abdominal Aortogram w/Lower Extremity;  Surgeon: Katha Cabal, MD;  Location: [place] [facility];  Service: Cardiovascular;  Laterality: N/A;   PERIPHERAL VASCULAR CATHETERIZATION  08/11/2015   Procedure: Lower Extremity Intervention;  Surgeon: Katha Cabal, MD;  Location: [place] [facility];  Service: Cardiovascular;;   TONSILLECTOMY      Social History   Socioeconomic History   Marital status: Widowed    Spouse name: Not on file   Number of children: Not on file   Years of education: Not on file   Highest education level: Not on file  Occupational History   Not on file  Tobacco Use   Smoking status: Every Day    Packs/day: 0.50    Years: 5.00    Pack years: 2.50    Types: Cigarettes   Smokeless tobacco: Never   Tobacco comments:    Has called [facility] and ordered nicotene patches. Will Quit when patches start  Vaping Use   Vaping Use: Never used  Substance and Sexual Activity   Alcohol use: No   Drug use: No   Sexual activity: Not on file  Other Topics Concern   Not on file  Social History Narrative   Not on file   Social Determinants of Health   Financial Resource Strain: Not on file  Food Insecurity: Not on file  Transportation  Needs: Not on file  Physical Activity: Not on file  Stress: Not on file  Social Connections: Not on file  Intimate Partner Violence: Not on file    Family History  Problem Relation Age of Onset   Heart failure Mother    Diabetes Brother    COPD Brother    Allergies  Allergen Reactions   Amiodarone Other (See Comments)    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  Would consider alternative agents if possible.   I? Current Facility-Administered Medications  Medication Dose Route Frequency Provider Last Rate Last Admin   0.9 %  sodium chloride infusion  250 mL Intravenous Continuous Ivor Costa, MD   Held at 09/16/21 0023   0.9 %  sodium chloride infusion  250 mL Intravenous Continuous Lang Snow, NP   Held at 09/16/21 0023   acetaminophen (TYLENOL) tablet 325 mg  325 mg Oral Q6H PRN Ivor Costa, MD   325 mg at 09/16/21 0850   allopurinol (ZYLOPRIM) tablet 300 mg  300 mg Oral Daily Ivor Costa, MD   300 mg at 09/17/21 8315   apixaban (ELIQUIS) tablet 5 mg  5 mg Oral BID Ivor Costa, MD   5 mg at 09/17/21 1761   atorvastatin (LIPITOR) tablet 20 mg  20 mg Oral Daily Ivor Costa, MD   20 mg at 09/17/21 6073   ceFAZolin (ANCEF) IVPB 2g/100 mL premix  2 g Intravenous Q8H Sharen Hones, MD       Chlorhexidine Gluconate Cloth 2 % PADS 6 each  6 each Topical Q0600 Ivor Costa, MD   6 each at 09/17/21 0432   diphenhydrAMINE (BENADRYL) injection 12.5 mg  12.5 mg Intravenous Q8H PRN Ivor Costa, MD       metoprolol tartrate (LOPRESSOR) tablet 25 mg  25 mg Oral Q6H Theora Gianotti, NP   25 mg at 09/17/21 1026   mirtazapine (REMERON) tablet 15 mg  15 mg Oral QHS Ivor Costa, MD   15 mg at 09/16/21 2119   nicotine (NICODERM CQ - dosed in mg/24 hours) patch 21 mg  21 mg Transdermal Daily Ivor Costa, MD       pantoprazole (PROTONIX) EC tablet 40 mg  40 mg Oral Daily Ivor Costa, MD   40 mg at 09/17/21 7106   potassium PHOSPHATE 15 mmol in dextrose 5 %  250 mL infusion  15 mmol Intravenous Once Rust-Chester, Britton L, NP 43 mL/hr at 09/17/21 1100 Infusion Verify at 09/17/21 1100   predniSONE (DELTASONE) tablet 10 mg  10 mg Oral Q breakfast Flora Lipps, MD   10 mg at 09/17/21 0758   sodium bicarbonate tablet 1,300 mg  1,300 mg Oral BID Sharen Hones, MD   1,300 mg at 09/17/21 1145  traZODone (DESYREL) tablet 50 mg  50 mg Oral QHS PRN Ivor Costa, MD   50 mg at 09/16/21 2120     Abtx:  Anti-infectives (From admission, onward)    Start     Dose/Rate Route Frequency Ordered Stop   09/17/21 1400  ceFAZolin (ANCEF) IVPB 2g/100 mL premix        2 g 200 mL/hr over 30 Minutes Intravenous Every 8 hours 09/17/21 1049     09/17/21 0400  ceFAZolin (ANCEF) IVPB 2g/100 mL premix  Status:  Discontinued        2 g 200 mL/hr over 30 Minutes Intravenous Every 8 hours 09/17/21 0107 09/17/21 1013   09/16/21 1900  vancomycin (VANCOCIN) IVPB 1000 mg/200 mL premix  Status:  Discontinued        1,000 mg 200 mL/hr over 60 Minutes Intravenous Every 24 hours 09/15/21 1822 09/16/21 0737   09/16/21 1800  vancomycin (VANCOREADY) IVPB 1250 mg/250 mL  Status:  Discontinued        1,250 mg 166.7 mL/hr over 90 Minutes Intravenous Every 24 hours 09/16/21 0737 09/17/21 0726   09/16/21 0500  ceFEPIme (MAXIPIME) 2 g in sodium chloride 0.9 % 100 mL IVPB  Status:  Discontinued        2 g 200 mL/hr over 30 Minutes Intravenous Every 12 hours 09/15/21 1817 09/15/21 1817   09/16/21 0500  ceFEPIme (MAXIPIME) 2 g in sodium chloride 0.9 % 100 mL IVPB  Status:  Discontinued        2 g 200 mL/hr over 30 Minutes Intravenous Every 12 hours 09/15/21 1822 09/17/21 0105   09/15/21 1830  vancomycin (VANCOCIN) IVPB 1000 mg/200 mL premix        1,000 mg 200 mL/hr over 60 Minutes Intravenous  Once 09/15/21 1822 09/15/21 2159   09/15/21 1345  ceFEPIme (MAXIPIME) 2 g in sodium chloride 0.9 % 100 mL IVPB        2 g 200 mL/hr over 30 Minutes Intravenous  Once 09/15/21 1332 09/15/21 1537    09/15/21 1345  metroNIDAZOLE (FLAGYL) IVPB 500 mg        500 mg 100 mL/hr over 60 Minutes Intravenous  Once 09/15/21 1332 09/15/21 1538   09/15/21 1345  vancomycin (VANCOCIN) IVPB 1000 mg/200 mL premix        1,000 mg 200 mL/hr over 60 Minutes Intravenous  Once 09/15/21 1332 09/15/21 1537       REVIEW OF SYSTEMS:  Const:  fever, negative chills, negative weight loss Eyes: negative diplopia or visual changes, negative eye pain ENT: negative coryza, negative sore throat Resp: negative cough, hemoptysis, has dyspnea Cards: negative for chest pain, palpitations, lower extremity edema GU: negative for frequency, dysuria and hematuria GI: Negative for abdominal pain, diarrhea, bleeding, constipation Skin: negative for rash and pruritus Heme: negative for easy bruising and gum/nose bleeding MS: weakness Pain rt arm Neurolo: headaches, dizziness, ,no  memory problems  Psych: negative for feelings of anxiety, depression  Endocrine: negative for thyroid, diabetes Allergy/Immunology- Amiodarone Objective:  VITALS:  BP (!) 125/53    Pulse (!) 117    Temp 98.3 F (36.8 C) (Oral)    Resp 18    Ht 6' (1.829 m)    Wt 83.3 kg    SpO2 100%    BMI 24.91 kg/m  PHYSICAL EXAM:  General: Alert, cooperative, no distress at rest , appears stated age.  Head: Normocephalic, without obvious abnormality, atraumatic. Eyes: Conjunctivae clear, anicteric sclerae. Pupils are equal ENT Nares normal.  No drainage or sinus tenderness. Lips, mucosa, and tongue normal. No Thrush Neck: Supple, symmetrical, no adenopathy, thyroid: non tender no carotid bruit and no JVD. Back: No CVA tenderness. Lungs: b/l air entry, a few crepts in the bases Heart: Regular rate and rhythm, no murmur, rub or gallop.ICD in place Abdomen: Soft, non-tender,not distended. Bowel sounds normal. No masses Extremities: rt arm- thickened tender cord rt arm above cubital fossa     atraumatic, no cyanosis. No edema. No clubbing Skin: No  rashes or lesions. Or bruising Lymph: Cervical, supraclavicular normal. Neurologic: Grossly non-focal Pertinent Labs Lab Results CBC    Component Value Date/Time   WBC 10.2 09/17/2021 0415   RBC 2.44 (L) 09/17/2021 0415   HGB 7.6 (L) 09/17/2021 0415   HCT 22.5 (L) 09/17/2021 0415   PLT 118 (L) 09/17/2021 0415   MCV 92.2 09/17/2021 0415   MCH 31.1 09/17/2021 0415   MCHC 33.8 09/17/2021 0415   RDW 16.4 (H) 09/17/2021 0415   LYMPHSABS 1.2 09/17/2021 0415   MONOABS 1.0 09/17/2021 0415   EOSABS 0.0 09/17/2021 0415   BASOSABS 0.0 09/17/2021 0415    CMP Latest Ref Rng & Units 09/17/2021 09/16/2021 09/15/2021  Glucose 70 - 99 mg/dL 112(H) 104(H) 154(H)  BUN 8 - 23 mg/dL 29(H) 30(H) 34(H)  Creatinine 0.61 - 1.24 mg/dL 1.12 1.36(H) 1.83(H)  Sodium 135 - 145 mmol/L 136 135 133(L)  Potassium 3.5 - 5.1 mmol/L 3.9 3.8 3.3(L)  Chloride 98 - 111 mmol/L 115(H) 113(H) 107  CO2 22 - 32 mmol/L 16(L) 18(L) 18(L)  Calcium 8.9 - 10.3 mg/dL 7.9(L) 7.9(L) 8.5(L)  Total Protein 6.5 - 8.1 g/dL - - 6.6  Total Bilirubin 0.3 - 1.2 mg/dL - - 1.2  Alkaline Phos 38 - 126 U/L - - 101  AST 15 - 41 U/L - - 141(H)  ALT 0 - 44 U/L - - 71(H)      Microbiology: Recent Results (from the past 240 hour(s))  MRSA Next Gen by PCR, Nasal     Status: None   Collection Time: 09/15/21  1:15 AM   Specimen: Nasal Mucosa; Nasal Swab  Result Value Ref Range Status   MRSA by PCR Next Gen NOT DETECTED NOT DETECTED Final    Comment: (NOTE) The GeneXpert MRSA Assay (FDA approved for NASAL specimens only), is one component of a comprehensive MRSA colonization surveillance program. It is not intended to diagnose MRSA infection nor to guide or monitor treatment for MRSA infections. Test performance is not FDA approved in patients less than 54 years old. Performed at [facility], [address] [place], [place] [address]   Resp Panel by RT-PCR (Flu A&B, Covid) Nasopharyngeal Swab     Status: None   Collection  Time: 09/15/21  1:28 PM   Specimen: Nasopharyngeal Swab; Nasopharyngeal(NP) swabs in vial transport medium  Result Value Ref Range Status   SARS Coronavirus 2 by RT PCR NEGATIVE NEGATIVE Final    Comment: (NOTE) SARS-CoV-2 target nucleic acids are NOT DETECTED.  The SARS-CoV-2 RNA is generally detectable in upper respiratory specimens during the acute phase of infection. The lowest concentration of SARS-CoV-2 viral copies this assay can detect is 138 copies/mL. A negative result does not preclude SARS-Cov-2 infection and should not be used as the sole basis for treatment or other patient management decisions. A negative result may occur with  improper specimen collection/handling, submission of specimen other than nasopharyngeal swab, presence of viral mutation(s) within the areas targeted by this assay, and  inadequate number of viral copies(<138 copies/mL). A negative result must be combined with clinical observations, patient history, and epidemiological information. The expected result is Negative.  Fact Sheet for Patients:  EntrepreneurPulse.com.au  Fact Sheet for Healthcare Providers:  IncredibleEmployment.be  This test is no t yet approved or cleared by the [place] FDA and  has been authorized for detection and/or diagnosis of SARS-CoV-2 by FDA under an Emergency Use Authorization (EUA). This EUA will remain  in effect (meaning this test can be used) for the duration of the COVID-19 declaration under Section 564(b)(1) of the Act, 21 U.S.C.section 360bbb-3(b)(1), unless the authorization is terminated  or revoked sooner.       Influenza A by PCR NEGATIVE NEGATIVE Final   Influenza B by PCR NEGATIVE NEGATIVE Final    Comment: (NOTE) The Xpert Xpress SARS-CoV-2/FLU/RSV plus assay is intended as an aid in the diagnosis of influenza from Nasopharyngeal swab specimens and should not be used as a sole basis for treatment. Nasal washings  and aspirates are unacceptable for Xpert Xpress SARS-CoV-2/FLU/RSV testing.  Fact Sheet for Patients: EntrepreneurPulse.com.au  Fact Sheet for Healthcare Providers: IncredibleEmployment.be  This test is not yet approved or cleared by the [place] FDA and has been authorized for detection and/or diagnosis of SARS-CoV-2 by FDA under an Emergency Use Authorization (EUA). This EUA will remain in effect (meaning this test can be used) for the duration of the COVID-19 declaration under Section 564(b)(1) of the Act, 21 U.S.C. section 360bbb-3(b)(1), unless the authorization is terminated or revoked.  Performed at [facility], [address], [place], [place] [address]   Urine Culture     Status: None   Collection Time: 09/15/21  5:05 PM   Specimen: Urine, Random  Result Value Ref Range Status   Specimen Description   Final    URINE, RANDOM Performed at [facility], [address], [place], [place] [address]    Special Requests   Final    NONE Performed at [facility], [address], [place], [place] [address]    Culture   Final    NO GROWTH Performed at [facility], [place] [address], [place], [place] [address]    Report Status 09/17/2021 FINAL  Final  Culture, blood (Routine X 2) w Reflex to ID Panel     Status: None (Preliminary result)   Collection Time: 09/15/21 11:54 PM   Specimen: BLOOD  Result Value Ref Range Status   Specimen Description   Final    BLOOD LEFT FOREARM Performed at [facility], [address] [place], [place] [address]    Special Requests   Final    BOTTLES DRAWN AEROBIC AND ANAEROBIC Blood Culture results may not be optimal due to an inadequate volume of blood received in culture bottles Performed at [facility], [address], [place], [place] [address]    Culture  Setup Time   Final    GRAM POSITIVE COCCI ANAEROBIC BOTTLE ONLY Organism ID to  follow CRITICAL RESULT CALLED TO, READ BACK BY AND VERIFIED WITH: NATHAN BELUE @0028  ON 09/17/21 [facility] GRAM STAIN REVIEWED-AGREE WITH RESULT Performed at [facility], [place] [address] [place],  [address]    Culture [facility] POSITIVE COCCI  Final   Report Status PENDING  Incomplete  Blood Culture ID Panel (Reflexed)     Status: Abnormal   Collection Time: 09/15/21 11:54 PM  Result Value Ref Range Status   Enterococcus faecalis NOT DETECTED NOT  DETECTED Final   Enterococcus Faecium NOT DETECTED NOT DETECTED Final   Listeria monocytogenes NOT DETECTED NOT DETECTED Final   Staphylococcus species DETECTED (A) NOT DETECTED Final    Comment: CRITICAL RESULT CALLED TO, READ BACK BY AND VERIFIED WITH: NATHAN BELUE @0028  ON 09/17/21 SKL    Staphylococcus aureus (BCID) DETECTED (A) NOT DETECTED Final    Comment: CRITICAL RESULT CALLED TO, READ BACK BY AND VERIFIED WITH: NATHAN BELUE @0028  ON 09/17/21 SKL    Staphylococcus epidermidis NOT DETECTED NOT DETECTED Final   Staphylococcus lugdunensis NOT DETECTED NOT DETECTED Final   Streptococcus species NOT DETECTED NOT DETECTED Final   Streptococcus agalactiae NOT DETECTED NOT DETECTED Final   Streptococcus pneumoniae NOT DETECTED NOT DETECTED Final   Streptococcus pyogenes NOT DETECTED NOT DETECTED Final   A.calcoaceticus-baumannii NOT DETECTED NOT DETECTED Final   Bacteroides fragilis NOT DETECTED NOT DETECTED Final   Enterobacterales NOT DETECTED NOT DETECTED Final   Enterobacter cloacae complex NOT DETECTED NOT DETECTED Final   Escherichia coli NOT DETECTED NOT DETECTED Final   Klebsiella aerogenes NOT DETECTED NOT DETECTED Final   Klebsiella oxytoca NOT DETECTED NOT DETECTED Final   Klebsiella pneumoniae NOT DETECTED NOT DETECTED Final   Proteus species NOT DETECTED NOT DETECTED Final   Salmonella species NOT DETECTED NOT DETECTED Final   Serratia marcescens NOT DETECTED NOT DETECTED Final   Haemophilus influenzae NOT DETECTED NOT  DETECTED Final   Neisseria meningitidis NOT DETECTED NOT DETECTED Final   Pseudomonas aeruginosa NOT DETECTED NOT DETECTED Final   Stenotrophomonas maltophilia NOT DETECTED NOT DETECTED Final   Candida albicans NOT DETECTED NOT DETECTED Final   Candida auris NOT DETECTED NOT DETECTED Final   Candida glabrata NOT DETECTED NOT DETECTED Final   Candida krusei NOT DETECTED NOT DETECTED Final   Candida parapsilosis NOT DETECTED NOT DETECTED Final   Candida tropicalis NOT DETECTED NOT DETECTED Final   Cryptococcus neoformans/gattii NOT DETECTED NOT DETECTED Final   Meth resistant mecA/C and MREJ NOT DETECTED NOT DETECTED Final    Comment: Performed at [facility], [address] [place], [place] [address]  Culture, blood (Routine X 2) w Reflex to ID Panel     Status: None (Preliminary result)   Collection Time: 09/16/21  1:44 AM   Specimen: BLOOD  Result Value Ref Range Status   Specimen Description BLOOD LEFT HAND  Final   Special Requests   Final    BOTTLES DRAWN AEROBIC AND ANAEROBIC Blood Culture adequate volume   Culture   Final    NO GROWTH 1 DAY Performed at [facility], [address] [place], [place] [address]    Report Status PENDING  Incomplete    IMAGING RESULTS:  Mild pulmonary edema.  Cardiac device present.  Sternal sutures present I have personally reviewed the films ? Impression/Recommendation MSSA bacteremia.  1 out of 4 bottle- it is a true pathogen and not a contaminant and likely cause of his sepsis and initial presentation source is  the right arm phlebitis/cellulitis at the prior IV infusion site No abscess seen De-escalated broad spectrum IV  antibiotics to cefazolin. We will repeat blood cultures tomorrow Get 2D echo Justification for  TEE is not straightforward in his case - HE has a known source which is the thrombophlebitis, low bioburden 1 out of 4 but he has a cardiac device has a risk for endocarditis.Decide after 2 d echo and  repeat blood culture Leukocytosis on presentation has resolved Clinically no evidence of pneumonia-  Thrombocytopenia -fluctuates Transaminitis on presentation AST > ALT With above both r/o cirrhosis- will start with US liver to look at the contour  Anemia- unclear cause for IDA- need to look for esophageal varices if cirrhosis is seen in the [place] Received IV iron recently  AKI on presentation is resolved   CAD status post CABG Atrial fibrillation- ow in sinus rhythm Ischemic cardiomyopathy Patient has AICD  Degenerative disc disease.  Prior  surgery but no hardware  ? ___________________________________________________ Discussed with patient, requesting provider I will be away from 09/18/21 [facility] will follow the patient from 09/20/21 Over the weekend ID will follow peripherally. On call physician available for any urgent issues Note:  This document was prepared using Dragon voice recognition software and may include unintentional dictation errors.

## 2021-09-18 ENCOUNTER — Inpatient Hospital Stay (HOSPITAL_COMMUNITY)
Admit: 2021-09-18 | Discharge: 2021-09-18 | Disposition: A | Discharge status: Home / Self Care | Payer: Medicare HMO | Attending: Infectious Diseases | Admitting: Infectious Diseases

## 2021-09-18 DIAGNOSIS — R7881 Bacteremia: Secondary | ICD-10-CM | POA: Diagnosis not present

## 2021-09-18 DIAGNOSIS — A419 Sepsis, unspecified organism: Secondary | ICD-10-CM | POA: Diagnosis not present

## 2021-09-18 DIAGNOSIS — D500 Iron deficiency anemia secondary to blood loss (chronic): Secondary | ICD-10-CM | POA: Diagnosis not present

## 2021-09-18 DIAGNOSIS — L03113 Cellulitis of right upper limb: Secondary | ICD-10-CM

## 2021-09-18 DIAGNOSIS — I2510 Atherosclerotic heart disease of native coronary artery without angina pectoris: Secondary | ICD-10-CM | POA: Diagnosis not present

## 2021-09-18 DIAGNOSIS — N171 Acute kidney failure with acute cortical necrosis: Secondary | ICD-10-CM | POA: Diagnosis not present

## 2021-09-18 DIAGNOSIS — I480 Paroxysmal atrial fibrillation: Secondary | ICD-10-CM | POA: Diagnosis not present

## 2021-09-18 DIAGNOSIS — I5022 Chronic systolic (congestive) heart failure: Secondary | ICD-10-CM | POA: Diagnosis not present

## 2021-09-18 DIAGNOSIS — R6521 Severe sepsis with septic shock: Secondary | ICD-10-CM | POA: Diagnosis not present

## 2021-09-18 LAB — BASIC METABOLIC PANEL
Anion gap: 9 (ref 5–15)
BUN: 28 mg/dL — ABNORMAL HIGH (ref 8–23)
CO2: 16 mmol/L — ABNORMAL LOW (ref 22–32)
Calcium: 7.9 mg/dL — ABNORMAL LOW (ref 8.9–10.3)
Chloride: 113 mmol/L — ABNORMAL HIGH (ref 98–111)
Creatinine, Ser: 0.94 mg/dL (ref 0.61–1.24)
GFR, Estimated: >60 mL/min (ref >60)
Glucose, Bld: 109 mg/dL — ABNORMAL HIGH (ref 70–99)
Potassium: 3.7 mmol/L (ref 3.5–5.1)
Sodium: 138 mmol/L (ref 135–145)

## 2021-09-18 LAB — CBC WITH DIFFERENTIAL/PLATELET
Abs Immature Granulocytes: 0.06 10*3/uL (ref 0.00–0.07)
Basophils Absolute: 0 10*3/uL (ref 0.0–0.1)
Basophils Relative: 0 %
Eosinophils Absolute: 0 10*3/uL (ref 0.0–0.5)
Eosinophils Relative: 0 %
HCT: 21.8 % — ABNORMAL LOW (ref 39.0–52.0)
Hemoglobin: 7.3 g/dL — ABNORMAL LOW (ref 13.0–17.0)
Immature Granulocytes: 1 %
Lymphocytes Relative: 20 %
Lymphs Abs: 1.6 10*3/uL (ref 0.7–4.0)
MCH: 30.5 pg (ref 26.0–34.0)
MCHC: 33.5 g/dL (ref 30.0–36.0)
MCV: 91.2 fL (ref 80.0–100.0)
Monocytes Absolute: 0.9 10*3/uL (ref 0.1–1.0)
Monocytes Relative: 11 %
Neutro Abs: 5.6 10*3/uL (ref 1.7–7.7)
Neutrophils Relative %: 68 %
Platelets: 124 10*3/uL — ABNORMAL LOW (ref 150–400)
RBC: 2.39 MIL/uL — ABNORMAL LOW (ref 4.22–5.81)
RDW: 16.2 % — ABNORMAL HIGH (ref 11.5–15.5)
WBC: 8.2 10*3/uL (ref 4.0–10.5)
nRBC: 0 % (ref 0.0–0.2)

## 2021-09-18 LAB — ECHOCARDIOGRAM COMPLETE
AR max vel: 2.64 cm2
AV Peak grad: 11.6 mmHg
Ao pk vel: 1.7 m/s
Area-P 1/2: 3 cm2
Calc EF: 53.2 %
Height: 72 in
P 1/2 time: 548 msec
S' Lateral: 4.5 cm
Single Plane A2C EF: 51.2 %
Single Plane A4C EF: 56.1 %
Weight: 2913.6 oz

## 2021-09-18 LAB — PHOSPHORUS: Phosphorus: 2.1 mg/dL — ABNORMAL LOW (ref 2.5–4.6)

## 2021-09-18 LAB — MAGNESIUM: Magnesium: 2.1 mg/dL (ref 1.7–2.4)

## 2021-09-18 MED ORDER — METOPROLOL SUCCINATE ER 100 MG PO TB24
100.0000 mg | ORAL_TABLET | Freq: Every day | ORAL | Status: DC
  Administered 2021-09-18 – 2021-09-22 (×5): 100 mg via ORAL
  Filled 2021-09-18 (×5): qty 1

## 2021-09-18 MED ORDER — LINEZOLID 600 MG PO TABS
600.0000 mg | ORAL_TABLET | Freq: Two times a day (BID) | ORAL | Status: DC
  Administered 2021-09-18 – 2021-09-20 (×5): 600 mg via ORAL
  Filled 2021-09-18 (×6): qty 1

## 2021-09-18 MED ORDER — STERILE WATER FOR INJECTION IV SOLN
INTRAMUSCULAR | Status: DC
  Filled 2021-09-18 (×3): qty 1000

## 2021-09-18 MED ORDER — POTASSIUM PHOSPHATES 15 MMOLE/5ML IV SOLN
30.0000 mmol | Freq: Once | INTRAVENOUS | Status: AC
  Administered 2021-09-18: 30 mmol via INTRAVENOUS
  Filled 2021-09-18: qty 10

## 2021-09-18 MED ORDER — POTASSIUM & SODIUM PHOSPHATES 280-160-250 MG PO PACK
2.0000 | PACK | Freq: Three times a day (TID) | ORAL | Status: DC
  Administered 2021-09-18 – 2021-09-19 (×6): 2 via ORAL
  Filled 2021-09-18 (×8): qty 2

## 2021-09-18 MED ORDER — CEPHALEXIN 500 MG PO CAPS: 500.0000 mg | ORAL_CAPSULE | Freq: Four times a day (QID) | ORAL | Status: DC

## 2021-09-18 MED ORDER — CYANOCOBALAMIN 1000 MCG/ML IJ SOLN
1000.0000 ug | Freq: Once | INTRAMUSCULAR | Status: AC
  Administered 2021-09-18: 1000 ug via INTRAMUSCULAR
  Filled 2021-09-18: qty 1

## 2021-09-18 MED ORDER — LINEZOLID 600 MG/300ML IV SOLN: 600.0000 mg | Freq: Two times a day (BID) | INTRAVENOUS | Status: DC

## 2021-09-18 MED ORDER — SODIUM BICARBONATE 650 MG PO TABS
1300.0000 mg | ORAL_TABLET | Freq: Four times a day (QID) | ORAL | Status: DC
Start: 2021-09-18 — End: 2021-09-21
  Administered 2021-09-18 – 2021-09-21 (×11): 1300 mg via ORAL
  Filled 2021-09-18 (×11): qty 2

## 2021-09-18 NOTE — Progress Notes (Addendum)
PHARMACY - PHYSICIAN COMMUNICATION CRITICAL VALUE ALERT - BLOOD CULTURE IDENTIFICATION (BCID)  Duane Owens is an 81 y.o. male who presented to [facility] on 09/15/2021 with a chief complaint of septic phlebitis/cellulitis.  Assessment:  Bacteremia  Name of physician (or Provider) Contacted: Sharion Settler NP. ID provider following. See note from Dr Baxter Flattery on 2/25  Current antibiotics: Linezolid  Changes to prescribed antibiotics recommended:  Patient is on recommended antibiotics - No changes needed  Results for orders placed or performed during the hospital encounter of 09/15/21  Blood Culture ID Panel (Reflexed) (Collected: 09/15/2021 11:54 PM)  Result Value Ref Range   Enterococcus faecalis NOT DETECTED NOT DETECTED   Enterococcus Faecium NOT DETECTED NOT DETECTED   Listeria monocytogenes NOT DETECTED NOT DETECTED   Staphylococcus species DETECTED (A) NOT DETECTED   Staphylococcus aureus (BCID) DETECTED (A) NOT DETECTED   Staphylococcus epidermidis NOT DETECTED NOT DETECTED   Staphylococcus lugdunensis NOT DETECTED NOT DETECTED   Streptococcus species NOT DETECTED NOT DETECTED   Streptococcus agalactiae NOT DETECTED NOT DETECTED   Streptococcus pneumoniae NOT DETECTED NOT DETECTED   Streptococcus pyogenes NOT DETECTED NOT DETECTED   A.calcoaceticus-baumannii NOT DETECTED NOT DETECTED   Bacteroides fragilis NOT DETECTED NOT DETECTED   Enterobacterales NOT DETECTED NOT DETECTED   Enterobacter cloacae complex NOT DETECTED NOT DETECTED   Escherichia coli NOT DETECTED NOT DETECTED   Klebsiella aerogenes NOT DETECTED NOT DETECTED   Klebsiella oxytoca NOT DETECTED NOT DETECTED   Klebsiella pneumoniae NOT DETECTED NOT DETECTED   Proteus species NOT DETECTED NOT DETECTED   Salmonella species NOT DETECTED NOT DETECTED   Serratia marcescens NOT DETECTED NOT DETECTED   Haemophilus influenzae NOT DETECTED NOT DETECTED   Neisseria meningitidis NOT DETECTED NOT DETECTED    Pseudomonas aeruginosa NOT DETECTED NOT DETECTED   Stenotrophomonas maltophilia NOT DETECTED NOT DETECTED   Candida albicans NOT DETECTED NOT DETECTED   Candida auris NOT DETECTED NOT DETECTED   Candida glabrata NOT DETECTED NOT DETECTED   Candida krusei NOT DETECTED NOT DETECTED   Candida parapsilosis NOT DETECTED NOT DETECTED   Candida tropicalis NOT DETECTED NOT DETECTED   Cryptococcus neoformans/gattii NOT DETECTED NOT DETECTED   Meth resistant mecA/C and MREJ NOT DETECTED NOT DETECTED    Levana Minetti Rodriguez-Guzman PharmD, BCPS 09/18/2021 10:18 PM

## 2021-09-18 NOTE — Progress Notes (Addendum)
IV team consulted for second PIV, d/t multiple incompatible IVFs ordered. IV team in room, unable to establish new site d/t pt being limited to left arm only. LFA PIV infusing IVFs, now edematous, tender to touch, warm and hard. IVF stopped. MD made aware. MD ok to leave IV out and change all meds to oral.

## 2021-09-18 NOTE — Progress Notes (Signed)
Progress Note   Patient: Duane Owens ZSW:[id number] DOB: 07/20/1941 DOA: 09/15/2021     3 DOS: the patient was seen and examined on 09/18/2021   Brief hospital course:  Duane Owens is a 81 y.o. male with medical history significant of a fib on Eliquis, hypertension, hyperlipidemia, GERD, gout, depression, varicose vein, sCHF with EF of 35%, prostate cancer (s/p of radiation therapy), anemia, chronic pain, thrombocytopenia, CAD, pacemaker placement, CKD-3, who present fall and weakness.  Was recently hospitalized at [facility] on 2/16 for 4 days for dizziness and syncope. Upon arriving the hospital, she had temperature of 100.5, leukocytosis of 1822, elevated PT/INR, elevated troponin 143, elevated AST ALT, creatinine 1.83. He developed hypotension, did not respond to IV fluids.  He was transferred to ICU and placed on pressor.     Assessment and Plan:  Septic shock secondary to MSSA septicemia secondary to cellulitis Right arm cellulitis with superficial phlebitis. Pneumonia ruled out. Appreciate consult from ID, patient was treated with cefazolin per ID consult. Echocardiogram is pending, repeat blood culture is also pending. He is clinically improved, currently ran out IV line.  Discussed with Dr. Baxter Flattery, will change antibiotic to oral Zyvox.  Discontinue antidepressant. Will reassess after repeat blood culture and echocardiogram sre available to decide if TEE is warranted.  Acute kidney injury secondary to shock. Elevated liver enzymes secondary to septic shock. Metabolic acidosis secondary to acute kidney injury. Hypophosphatemia. Liver ultrasound did not show any evidence of liver cirrhosis. Condition has improved. Patient still has significant hypokalemia and hypophosphatemia.  Will replete with oral phosphorus. Continue sodium bicarbonate 4 times a day.  Elevated troponin secondary to septic shock Coronary artery disease. Paroxysmal atrial fibrillation with rapid  ventricle response. Acute on chronic systolic congestive heart failure. Followed by cardiology.  Condition has improved.  Continue anticoagulation.  Severe anemia. Thrombocytopenia. B12 borderline, will give another shot of B12.  Iron level adequate. Hemoglobin dropped down to 7.3, will transfuse as needed. Thrombocytopenia is getting better.  No evidence of active bleeding.   Subjective:  Patient does feel better today, no short of breath or hypoxia. Denies any abdominal pain nausea vomiting or diarrhea.    Physical Exam: Vitals:   09/17/21 1742 09/18/21 0606 09/18/21 0736 09/18/21 1140  BP: (!) 122/57 (!) 126/59 119/73 (!) 118/53  Pulse: 86 65 69 71  Resp: 18  16 18   Temp: 97.8 F (36.6 C) 98.1 F (36.7 C) 98 F (36.7 C) 98 F (36.7 C)  TempSrc: Oral     SpO2: 100% 99% 98% 100%  Weight:      Height:       General exam: Appears calm and comfortable  Respiratory system: Clear to auscultation. Respiratory effort normal. Cardiovascular system: S1 & S2 heard, RRR. No JVD, murmurs, rubs, gallops or clicks. No pedal edema. Gastrointestinal system: Abdomen is nondistended, soft and nontender. No organomegaly or masses felt. Normal bowel sounds heard. Central nervous system: Alert and oriented. No focal neurological deficits. Extremities: Right arm swelling and redness much improved. Skin: No rashes, lesions or ulcers Psychiatry: Judgement and insight appear normal. Mood & affect appropriate.    Data Reviewed: Reviewed liver ultrasound results, all labs. Family Communication: daughter updated  Disposition: Status is: Inpatient Remains inpatient appropriate because:           Planned Discharge Destination: Home with Home Health     Time spent: 45 minutes  Author: Sharen Hones, MD 09/18/2021 12:31 PM  For on call review [email or web address].

## 2021-09-18 NOTE — Progress Notes (Signed)
A consult was placed to IV Therapy requesting a 2nd iv site due to incompatible medications;  pt is limited to the Left arm for all ivs, and labwork;  Pt was seen by IV Team Friday and a new site was started, but that site has become very phlebitic; the left hand, wrist , and forearm (up to the ac) is edematous, warm to touch and tender.  Pt is very reluctant to "be stuck again" as this will be the 5th iv since admission;  RN is currently notifying the MD regarding access issues.

## 2021-09-18 NOTE — Evaluation (Signed)
Physical Therapy Evaluation Patient Details Name: Duane Owens MRN: [id number] DOB: 23-Sep-1940 Today's Date: 09/18/2021  History of Present Illness  Pt is an 81 y/o M admitted on 09/15/21 after presenting with c/o fall & weakness. Pt is being treated for septic shock 2/2 HCAP at the L lower lobe, RUE mild cellulitis with superficial thrombosis, AKI 2/2 shock. Pt was recnetly hospitalized at [facility] on 2/16 x 4 days for dizziness & syncope. PMH: a-fib on eliquis, HTN, HLD, GERD, gout, depression, sCHF, prostate CA s/p radiation therapy, anemia, chronic pain, thrombocytopenia, CAD, pacemaker, CKD-3  Clinical Impression  Pt seen for PT evaluation with pt reporting prior to admission he was independent without AD, only 1 fall in his entire life, and limited endurance & gait at baseline. On this date, pt is able to complete bed mobility without assistance and initially requires CGA but fade to supervision for gait without AD with pt demonstrating slow gait speed. Pt reports he's at his baseline level of function & declines practicing stairs on this date. At this time, pt does not demonstrate any acute PT needs so PT to sign off at this time, but recommend pt be seen by mobility tech to continue to mobilize with them or nursing staff while in acute setting.        Recommendations for follow up therapy are one component of a multi-disciplinary discharge planning process, led by the attending physician.  Recommendations may be updated based on patient status, additional functional criteria and insurance authorization.  Follow Up Recommendations No PT follow up    Assistance Recommended at Discharge Set up Supervision/Assistance  Patient can return home with the following  A little help with walking and/or transfers;A little help with bathing/dressing/bathroom;Assistance with cooking/housework;Help with stairs or ramp for entrance    Equipment Recommendations None recommended by PT  Recommendations for  Other Services       Functional Status Assessment Patient has not had a recent decline in their functional status     Precautions / Restrictions Precautions Precautions: Fall Restrictions Weight Bearing Restrictions: No      Mobility  Bed Mobility Overal bed mobility: Modified Independent Bed Mobility: Supine to Sit     Supine to sit: Modified independent (Device/Increase time), HOB elevated (bed rails)          Transfers Overall transfer level: Needs assistance Equipment used: None Transfers: Sit to/from Stand Sit to Stand: Min guard                Ambulation/Gait Ambulation/Gait assistance: Min guard, Supervision Gait Distance (Feet): 160 Feet Assistive device: None Gait Pattern/deviations: Decreased stride length Gait velocity: decreased     General Gait Details: Pt requires CGA fade to supervision with pt initially holding to furniture in room but then without AD/UE support in hallway.  Stairs Stairs:  (Pt declines practicing, reports steps "are a piece of cake".)          Wheelchair Mobility    Modified Rankin (Stroke Patients Only)       Balance Overall balance assessment: Needs assistance Sitting-balance support: Feet supported, Bilateral upper extremity supported Sitting balance-Leahy Scale: Good     Standing balance support: During functional activity, Bilateral upper extremity supported Standing balance-Leahy Scale: Fair                               Pertinent Vitals/Pain Pain Assessment Pain Assessment: Faces Faces Pain Scale: Hurts a little bit  Pain Location: RUE Pain Descriptors / Indicators: Sore Pain Intervention(s): Monitored during session    Home Living Family/patient expects to be discharged to:: Private residence Living Arrangements: Children (children & grandchildren) Available Help at Discharge: Family;Available 24 hours/day Type of Home: House Home Access: Stairs to enter Entrance Stairs-Rails:  Left Entrance Stairs-Number of Steps: 2 at [place]: One level Home Equipment: Conservation officer, nature (2 wheels);Cane - single point      Prior Function Prior Level of Function : Independent/Modified Independent             Mobility Comments: Pt reports independence without AD, only holds to furniture if he feels "dizzy", pt endorses only 1 fall in his whole life & that was prior to admission. Pt reports limited endurance (not able to stand & cook, his family does that for him. Pt reports "I only need to be able to put my clothes on & go to the bathroom".)       Hand Dominance        Extremity/Trunk Assessment   Upper Extremity Assessment Upper Extremity Assessment: Overall WFL for tasks assessed    Lower Extremity Assessment Lower Extremity Assessment: Overall WFL for tasks assessed;Generalized weakness    Cervical / Trunk Assessment Cervical / Trunk Assessment: Kyphotic  Communication   Communication: No difficulties  Cognition Arousal/Alertness: Awake/alert Behavior During Therapy: WFL for tasks assessed/performed Overall Cognitive Status: Within Functional Limits for tasks assessed                                 General Comments: Pt is pleasant and cooperative        General Comments General comments (skin integrity, edema, etc.): SPO2 100% on room air at end of session despite increased labored breathing & pt slightly SOB.    Exercises     Assessment/Plan    PT Assessment Patient does not need any further PT services  PT Problem List         PT Treatment Interventions      PT Goals (Current goals can be found in the Care Plan section)  Acute Rehab PT Goals Patient Stated Goal: go home PT Goal Formulation: With patient Time For Goal Achievement: 10/02/21 Potential to Achieve Goals: Good    Frequency       Co-evaluation               AM-PAC PT "6 Clicks" Mobility  Outcome Measure Help needed turning from your back  to your side while in a flat bed without using bedrails?: None Help needed moving from lying on your back to sitting on the side of a flat bed without using bedrails?: None Help needed moving to and from a bed to a chair (including a wheelchair)?: None Help needed standing up from a chair using your arms (e.g., wheelchair or bedside chair)?: None Help needed to walk in hospital room?: A Little Help needed climbing 3-5 steps with a railing? : A Little 6 Click Score: 22    End of Session   Activity Tolerance: Patient tolerated treatment well;Patient limited by fatigue Patient left: in chair;with chair alarm set;with call bell/phone within reach Nurse Communication: Mobility status      Time: 0175-1025 PT Time Calculation (min) (ACUTE ONLY): 15 min   Charges:   PT Evaluation $PT Eval Low Complexity: [place], PT, DPT 09/18/21,  11:12 AM   Waunita Schooner 09/18/2021, 11:10 AM

## 2021-09-18 NOTE — Progress Notes (Addendum)
Cardiology Progress Note   Patient Name: Duane Owens Date of Encounter: 09/18/2021  Primary Cardiologist: [facility]  Subjective   No complaints this morning.  Denies chest pain, dyspnea, or palpitations.  Maintaining sinus rhythm following resumption of oral beta-blocker yesterday.  Inpatient Medications    Scheduled Meds:  allopurinol  300 mg Oral Daily   apixaban  5 mg Oral BID   atorvastatin  20 mg Oral Daily   Chlorhexidine Gluconate Cloth  6 each Topical Q0600   metoprolol tartrate  25 mg Oral Q6H   mirtazapine  15 mg Oral QHS   nicotine  21 mg Transdermal Daily   pantoprazole  40 mg Oral Daily   predniSONE  10 mg Oral Q breakfast   Continuous Infusions:  sodium chloride Stopped (09/16/21 0023)   sodium chloride Stopped (09/16/21 0023)    ceFAZolin (ANCEF) IV 2 g (09/18/21 0058)   potassium PHOSPHATE IVPB (in mmol)      sodium bicarbonate (isotonic) infusion in sterile water     PRN Meds: acetaminophen, diphenhydrAMINE, traZODone   Vital Signs    Vitals:   09/17/21 1435 09/17/21 1742 09/18/21 0606 09/18/21 0736  BP:  (!) 122/57 (!) 126/59 119/73  Pulse:  86 65 69  Resp:  18  16  Temp:  97.8 F (36.6 C) 98.1 F (36.7 C) 98 F (36.7 C)  TempSrc:  Oral    SpO2:  100% 99% 98%  Weight: 82.6 kg     Height:        Intake/Output Summary (Last 24 hours) at 09/18/2021 0757 Last data filed at 09/18/2021 0603 Gross per 24 hour  Intake 813.05 ml  Output 1265 ml  Net -451.95 ml   Filed Weights   09/16/21 0126 09/17/21 0310 09/17/21 1435  Weight: 82.9 kg 83.3 kg 82.6 kg    Physical Exam   GEN: Well nourished, well developed, in no acute distress.  HEENT: Grossly normal.  Neck: Supple, no JVD, carotid bruits, or masses. Cardiac: RRR, no murmurs, rubs, or gallops. No clubbing, cyanosis, edema.  Radials 2+, DP/PT 1+ and equal bilaterally.  Respiratory:  Respirations regular and unlabored, diminished breath sounds at the bases. GI: Soft, nontender,  nondistended, BS + x 4. MS: no deformity or atrophy. Skin: warm and dry, no rash. Neuro:  Strength and sensation are intact. Psych: AAOx3.  Normal affect.  Labs    Chemistry Recent Labs  Lab 09/15/21 1327 09/16/21 0419 09/17/21 0415 09/18/21 0547  NA 133* 135 136 138  K 3.3* 3.8 3.9 3.7  CL 107 113* 115* 113*  CO2 18* 18* 16* 16*  GLUCOSE 154* 104* 112* 109*  BUN 34* 30* 29* 28*  CREATININE 1.83* 1.36* 1.12 0.94  CALCIUM 8.5* 7.9* 7.9* 7.9*  PROT 6.6  --  5.3*  --   ALBUMIN 3.4*  --  2.5*  --   AST 141*  --  88*  --   ALT 71*  --  55*  --   ALKPHOS 101  --  75  --   BILITOT 1.2  --  0.8  --   GFRNONAA 37* 52* >60 >60  ANIONGAP 8 4* 5 9     Hematology Recent Labs  Lab 09/16/21 0419 09/17/21 0415 09/18/21 0547  WBC 15.5* 10.2 8.2  RBC 2.63* 2.44* 2.39*  HGB 7.9* 7.6* 7.3*  HCT 24.5* 22.5* 21.8*  MCV 93.2 92.2 91.2  MCH 30.0 31.1 30.5  MCHC 32.2 33.8 33.5  RDW 16.3* 16.4* 16.2*  PLT 104* 118* 124*    Cardiac Enzymes  Recent Labs  Lab 09/15/21 1554 09/15/21 2354 09/16/21 0144 09/16/21 0632 09/16/21 0738  TROPONINIHS 143* 117* 165* 136* 133*      BNP Recent Labs  Lab 09/15/21 1327  BNP 630.9*     Lipids  Lab Results  Component Value Date   CHOL 70 09/16/2021   HDL 18 (L) 09/16/2021   LDLCALC 36 09/16/2021   TRIG 79 09/16/2021   CHOLHDL 3.9 09/16/2021    HbA1c  Lab Results  Component Value Date   HGBA1C 5.6 09/15/2021    Radiology    DG Chest 2 View  Result Date: 09/17/2021 CLINICAL DATA:  Sepsis EXAM: CHEST - 2 VIEW COMPARISON:  Radiograph 09/15/2021 FINDINGS: Unchanged cardiomediastinal silhouette with pacemaker/AICD leads. Prior median sternotomy and CABG. Elevated left hemidiaphragm, unchanged. There are mild interstitial opacities. Faint right lower lung opacity and left medial basilar opacities, increased from prior. No large pleural effusion. No visible pneumothorax. No acute osseous abnormality. IMPRESSION: Increased left  medial basilar opacities and faint right peripheral lower lung opacity, which could represent atelectasis or developing infection. Electronically Signed   By: Maurine Simmering M.D.   On: 09/17/2021 08:40   CT HEAD WO CONTRAST (5MM)  Result Date: 09/15/2021 CLINICAL DATA:  Trauma EXAM: CT HEAD WITHOUT CONTRAST TECHNIQUE: Contiguous axial images were obtained from the base of the skull through the vertex without intravenous contrast. RADIATION DOSE REDUCTION: This exam was performed according to the departmental dose-optimization program which includes automated exposure control, adjustment of the mA and/or kV according to patient size and/or use of iterative reconstruction technique. COMPARISON:  None. FINDINGS: Brain: No acute intracranial findings are seen in noncontrast CT brain. Cortical sulci are prominent. Ventricles are not dilated. There is no focal mass effect. Vascular: Unremarkable Skull: Unremarkable. Sinuses/Orbits: There is mild mucosal thickening in the ethmoid and sphenoid sinuses. Other: None IMPRESSION: No acute intracranial findings are seen in noncontrast CT brain. Atrophy. Electronically Signed   By: Elmer Picker M.D.   On: 09/15/2021 20:03   CT HUMERUS RIGHT WO CONTRAST  Result Date: 09/16/2021 CLINICAL DATA:  Foreign body suspected, upper arm, negative x-ray. EXAM: CT OF THE RIGHT HUMERUS WITHOUT CONTRAST TECHNIQUE: Multidetector CT imaging was performed according to the standard protocol. Multiplanar CT image reconstructions were also generated. RADIATION DOSE REDUCTION: This exam was performed according to the departmental dose-optimization program which includes automated exposure control, adjustment of the mA and/or kV according to patient size and/or use of iterative reconstruction technique. COMPARISON:  None. FINDINGS: Bones/Joint/Cartilage No fracture or dislocation. Normal alignment. No joint effusion. Moderate acromioclavicular osteoarthritis. Ligaments Ligaments are  suboptimally evaluated by CT. Muscles and Tendons Muscles are normal in bulk and density. No intramuscular hematoma or fluid collection. No radiopaque foreign body. Soft tissue No fluid collection or hematoma.  No soft tissue mass. IMPRESSION: 1.  No acute osseous abnormality. 2. Muscles and subcutaneous soft tissues are within normal limits. No evidence of fluid collection or abscess. No radiopaque foreign body. Electronically Signed   By: Keane Police D.O.   On: 09/16/2021 13:25   CT FOREARM RIGHT WO CONTRAST  Result Date: 09/16/2021 CLINICAL DATA:  Foreign body suspected, forearm, negative x-ray. Cellulitis of the right arm. EXAM: CT OF THE RIGHT FOREARM WITHOUT CONTRAST TECHNIQUE: Multidetector CT imaging was performed according to the standard protocol. Multiplanar CT image reconstructions were also generated. RADIATION DOSE REDUCTION: This exam was performed according to the departmental dose-optimization program which includes automated  exposure control, adjustment of the mA and/or kV according to patient size and/or use of iterative reconstruction technique. COMPARISON:  None. FINDINGS: Bones/Joint/Cartilage No fracture or dislocation. Normal alignment. No joint effusion. Cystic changes about the distal ulna as well as in the lunate and scaphoid. Ligaments Ligaments are suboptimally evaluated by CT. Muscles and Tendons Muscles are normal in bulk and density. No intramuscular fluid collection or abscess. Tendons of flexor and extensor compartment appear within normal limits. Soft tissue No fluid collection or hematoma.  No soft tissue mass. IMPRESSION: 1. No acute osseous abnormality. Degenerative and cystic changes about the distal ulna and carpal bones. Mild osteoarthritis of the radiocapitellar and ulnar trochlear joints. 2. Muscles and subcutaneous soft tissues are within normal limits. No evidence of fluid collection or abscess. No radiopaque foreign body. Electronically Signed   By: Keane Police  D.O.   On: 09/16/2021 13:31   US RENAL  Result Date: 09/15/2021 CLINICAL DATA:  Renal dysfunction EXAM: RENAL / URINARY TRACT ULTRASOUND COMPLETE COMPARISON:  None. FINDINGS: Right Kidney: Renal measurements: 11.1 x 5.5 x 6.9 cm = volume: 219.6 mL. Echogenicity within normal limits. No mass or hydronephrosis visualized. Left Kidney: Renal measurements: 11.2 x 6.1 x 4.8 cm = volume: 171.5 mL. There is no hydronephrosis. There is 1.2 cm cyst in the left renal cortex. Cortical echogenicity is unremarkable. Bladder: Foley catheter is seen in the bladder. Bladder is empty limiting evaluation. Other: None. IMPRESSION: There is no hydronephrosis. Cortical echogenicity in the kidneys is unremarkable. 1.2 cm left renal cyst. Electronically Signed   By: Elmer Picker M.D.   On: 09/15/2021 18:13   US Venous Img Upper Uni Right(DVT)  Result Date: 09/16/2021 CLINICAL DATA:  Right upper extremity swelling Assess for DVT EXAM: RIGHT UPPER EXTREMITY VENOUS DOPPLER ULTRASOUND TECHNIQUE: Gray-scale sonography with graded compression, as well as color Doppler and duplex ultrasound were performed to evaluate the upper extremity deep venous system from the level of the subclavian vein and including the jugular, axillary, basilic, radial, ulnar and upper cephalic vein. Spectral Doppler was utilized to evaluate flow at rest and with distal augmentation maneuvers. COMPARISON:  None. FINDINGS: Contralateral Subclavian Vein: Respiratory phasicity is normal and symmetric with the symptomatic side. No evidence of thrombus. Normal compressibility. Internal Jugular Vein: No evidence of thrombus. Normal compressibility, respiratory phasicity and response to augmentation. Subclavian Vein: No evidence of thrombus. Normal compressibility, respiratory phasicity and response to augmentation. Axillary Vein: No evidence of thrombus. Normal compressibility, respiratory phasicity and response to augmentation. Cephalic Vein: Occlusive  thrombus noted in the cephalic vein in the antecubital fossa. Basilic Vein: No evidence of thrombus. Normal compressibility, respiratory phasicity and response to augmentation. Brachial Veins: No evidence of thrombus. Normal compressibility, respiratory phasicity and response to augmentation. Radial Veins: No evidence of thrombus. Normal compressibility, respiratory phasicity and response to augmentation. Ulnar Veins: No evidence of thrombus. Normal compressibility, respiratory phasicity and response to augmentation. Other Findings:  None visualized. IMPRESSION: 1. Superficial venous thrombosis of the right cephalic vein at the level of the antecubital fossa. 2. No right upper extremity DVT. Electronically Signed   By: Miachel Roux M.D.   On: 09/16/2021 12:35   DG Chest Portable 1 View  Result Date: 09/15/2021 CLINICAL DATA:  Weakness, fall. EXAM: PORTABLE CHEST 1 VIEW COMPARISON:  Chest radiograph dated 05/06/2008. FINDINGS: The heart size is normal. Vascular calcifications are seen in the aortic arch. Mild diffuse bilateral interstitial opacities likely represent pulmonary edema. There is no pleural effusion or pneumothorax. Degenerative  changes are seen in the spine. A left subclavian approach cardiac device is noted. Median sternotomy wires are intact and well aligned defibrillator pads overlie the chest. IMPRESSION: Mild diffuse bilateral interstitial opacities likely represent pulmonary edema. Aortic Atherosclerosis (ICD10-I70.0). Electronically Signed   By: Zerita Boers M.D.   On: 09/15/2021 13:41   US ABDOMEN LIMITED RUQ (LIVER/GB)  Result Date: 09/15/2021 CLINICAL DATA:  Abnormal liver function tests EXAM: ULTRASOUND ABDOMEN LIMITED RIGHT UPPER QUADRANT COMPARISON:  None. FINDINGS: Gallbladder: No gallstones or wall thickening visualized. No sonographic Murphy sign noted by sonographer. Common bile duct: Diameter: 3.8 mm Liver: No focal lesion identified. Within normal limits in parenchymal  echogenicity. Portal vein is patent on color Doppler imaging with normal direction of blood flow towards the liver. Other: None. IMPRESSION: No sonographic abnormality is seen in the right upper quadrant of abdomen. Electronically Signed   By: Elmer Picker M.D.   On: 09/15/2021 16:05    Telemetry    Sinus rhythm with atrial pacing on demand.  PVCs.- Personally Reviewed  Cardiac Studies   *2D echocardiogram ordered this admission and is currently pending  2D Echocardiogram 08/2021 [facility])   Summary    1. The left ventricular systolic function is moderately decreased, LVEF is  visually estimated at 35%.    2. There is grade I diastolic dysfunction (impaired relaxation).    3. There is mild mitral valve regurgitation.    4. The left atrium is mildly dilated in size.    5. The aortic valve is probably trileaflet with mildly thickened leaflets  with probably normal excursion.    6. There is mild aortic regurgitation.    7. The aorta at the sinuses of Valsalva is mildly dilated.    8. The right ventricle is normal in size, with normal systolic function.    9. The right atrium is mildly dilated  in size.  _____________   Patient Profile     81 y.o. male w/ a h/o CAD s/p prior CABG, chronic HFrEF, ICM (EF 35%), PAF on eliquis, GIB, and recent admission to [facility] w/ symptomatic anemia, orthostasis, and AKI, w/ plan for outpt GI w/u, who was admitted 2/22 w/ weakness and fall, w/ AFib RVR noted by EMS, and PAF since admission.  Assessment & Plan    1.  Paroxysmal atrial fibrillation with rapid ventricular response: History of PAF on chronic Eliquis in the outpatient setting.  Admitted with weakness, fall, left lower lobe HCAP, and bactermia.  Initially noted be tachycardic by EMS was treated with IV amiodarone in the field (review shows A-fib with RVR with wide-complex in the setting of underlying right bundle branch block).  Management of paroxysmal A-fib during hospitalization initially  complicated by low blood pressures but we were able to resume oral metoprolol on February 23 and he has been maintaining sinus rhythm with atrial pacing on demand.  We will consolidate metoprolol to Toprol-XL 100 mg daily.  He remains on Eliquis but would have a low threshold to hold if anemia continues to worsen, at least until the cause of his anemia is elucidated.  2.  CAD status post CABG/demand ischemia: Mild high-sensitivity troponin elevation with flat trend in the setting of respiratory failure/pneumonia/PAF (134  143  117  165  136  133).  No chest pain.  Recent echo w/ EF of 35%.  Cont med rx.  Continue beta-blocker and statin.  No plan for ischemic eval @ this time.  3.  Healthcare acquired pneumonia: Antibiotics per  primary team.  4.  Sepsis/MSSA bacteremia/right upper extremity thrombophlebitis: 1 of 4 bottles positive for MSSA.  Seen by infectious disease with plan to follow-up echo and repeat blood cultures.  5.  Chronic HFrEF/ischemic cardiomyopathy: EF 35% by echo at [facility] last week.  Euvolemic on examination.  Continue beta-blocker.  Hold off on diuretics and ARB at this time, but would consider resumption of low-dose ARB if pressure stable.  6.  Normocytic anemia: Recent admission at [facility] with anemia and concern for GI bleed.  Plan for outpatient GI work-up.  H&H continues to slowly drift down here.  If hemoglobin drops below 7, recommend transfusion.  Would also hold oral anticoagulation at that time.  7.  Stage III chronic kidney disease/acute kidney injury: Renal function stable.   Signed, Murray Hodgkins, NP  09/18/2021, 7:57 AM    For questions or updates, please contact   Please consult [email or web address] for contact info under Cardiology/STEMI.

## 2021-09-18 NOTE — Progress Notes (Signed)
[facility] for Infectious Disease    Date of Admission:  09/15/2021   Total days of antibiotics 4/day 3 of cefazolin           ID: Telford R Madry is a 81 y.o. male with  MSSA bacteremia with septic phlebitis/cellulitis Active Problems:   Thrombocytopenia (HCC)   HTN (hypertension)   HLD (hyperlipidemia)   Chronic systolic CHF (congestive heart failure) (HCC)   Normocytic anemia   CAD (coronary artery disease)   Fall   Elevated troponin   Abnormal LFTs   Acute renal failure superimposed on stage 3a chronic kidney disease (HCC)   NSVT (nonsustained ventricular tachycardia)   Hypokalemia   Septic shock (HCC)   Left lower lobe pneumonia   Right arm cellulitis   Paroxysmal atrial fibrillation with RVR (HCC)   Metabolic acidosis    Subjective: Afebrile, but loss of piv; TTE today did not have good visualization of AV  Medications:   allopurinol  300 mg Oral Daily   apixaban  5 mg Oral BID   atorvastatin  20 mg Oral Daily   Chlorhexidine Gluconate Cloth  6 each Topical Q0600   linezolid  600 mg Oral Q12H   metoprolol succinate  100 mg Oral Daily   nicotine  21 mg Transdermal Daily   pantoprazole  40 mg Oral Daily   potassium & sodium phosphates  2 packet Oral TID AC & HS   predniSONE  10 mg Oral Q breakfast   sodium bicarbonate  1,300 mg Oral QID    Objective: Vital signs in last 24 hours: Temp:  [97.8 F (36.6 C)-98.1 F (36.7 C)] 97.9 F (36.6 C) (02/25 1506) Pulse Rate:  [65-86] 68 (02/25 1506) Resp:  [16-20] 20 (02/25 1506) BP: (118-126)/(45-73) 119/45 (02/25 1506) SpO2:  [98 %-100 %] 99 % (02/25 1506)  Did not examine  Lab Results Recent Labs    09/17/21 0415 09/18/21 0547  WBC 10.2 8.2  HGB 7.6* 7.3*  HCT 22.5* 21.8*  NA 136 138  K 3.9 3.7  CL 115* 113*  CO2 16* 16*  BUN 29* 28*  CREATININE 1.12 0.94   Liver Panel Recent Labs    09/17/21 0415  PROT 5.3*  ALBUMIN 2.5*  AST 88*  ALT 55*  ALKPHOS 75  BILITOT 0.8  BILIDIR 0.2   IBILI 0.6    Microbiology: 2/22 blood cx MSSA (1 of 4) 2/23 blood cx ngtd 2/24 blood cx ngtd Studies/Results: DG Chest 2 View  Result Date: 09/17/2021 CLINICAL DATA:  Sepsis EXAM: CHEST - 2 VIEW COMPARISON:  Radiograph 09/15/2021 FINDINGS: Unchanged cardiomediastinal silhouette with pacemaker/AICD leads. Prior median sternotomy and CABG. Elevated left hemidiaphragm, unchanged. There are mild interstitial opacities. Faint right lower lung opacity and left medial basilar opacities, increased from prior. No large pleural effusion. No visible pneumothorax. No acute osseous abnormality. IMPRESSION: Increased left medial basilar opacities and faint right peripheral lower lung opacity, which could represent atelectasis or developing infection. Electronically Signed   By: Maurine Simmering M.D.   On: 09/17/2021 08:40   ECHOCARDIOGRAM COMPLETE  Result Date: 09/18/2021    ECHOCARDIOGRAM REPORT   Patient Name:   Duane Owens Date of Exam: 09/18/2021 Medical Rec #:  [id number]         Height:       72.0 in Accession #:    [id number]        Weight:       182.1 lb Date of Birth:  21-Aug-1940  BSA:          2.047 m Patient Age:    24 years          BP:           118/53 mmHg Patient Gender: M                 HR:           65 bpm. Exam Location:  [facility] Procedure: 2D Echo Indications:     Bacteremia R78.81  History:         Patient has no prior history of Echocardiogram examinations.  Sonographer:     Kathlen Brunswick RDCS Referring Phys:  [id number] Tsosie Billing Diagnosing Phys: Kate Sable MD IMPRESSIONS  1. Left ventricular ejection fraction, by estimation, is 55 to 60%. The left ventricle has normal function. The left ventricle has no regional wall motion abnormalities. Left ventricular diastolic parameters were normal.  2. Right ventricular systolic function is normal. The right ventricular size is not well visualized.  3. Right atrial size was mild to moderately dilated.  4. The mitral valve is  degenerative. Mild mitral valve regurgitation.  5. The aortic valve was not well visualized. Aortic valve regurgitation is mild. Aortic valve sclerosis/calcification is present, without any evidence of aortic stenosis.  6. Aortic dilatation noted. There is mild dilatation of the aortic root, measuring 41 mm. FINDINGS  Left Ventricle: Left ventricular ejection fraction, by estimation, is 55 to 60%. The left ventricle has normal function. The left ventricle has no regional wall motion abnormalities. The left ventricular internal cavity size was normal in size. There is  no left ventricular hypertrophy. Left ventricular diastolic parameters were normal. Right Ventricle: The right ventricular size is not well visualized. No increase in right ventricular wall thickness. Right ventricular systolic function is normal. Left Atrium: Left atrial size was normal in size. Right Atrium: Right atrial size was mild to moderately dilated. Pericardium: There is no evidence of pericardial effusion. Mitral Valve: The mitral valve is degenerative in appearance. There is mild calcification of the mitral valve leaflet(s). Mild mitral annular calcification. Mild mitral valve regurgitation. Tricuspid Valve: The tricuspid valve is not well visualized. Tricuspid valve regurgitation is not demonstrated. Aortic Valve: The aortic valve was not well visualized. Aortic valve regurgitation is mild. Aortic regurgitation PHT measures 548 msec. Aortic valve sclerosis/calcification is present, without any evidence of aortic stenosis. Aortic valve peak gradient measures 11.6 mmHg. Pulmonic Valve: The pulmonic valve was not well visualized. Pulmonic valve regurgitation is not visualized. Aorta: Aortic dilatation noted. There is mild dilatation of the aortic root, measuring 41 mm. Venous: The inferior vena cava was not well visualized. IAS/Shunts: No atrial level shunt detected by color flow Doppler. Additional Comments: A device lead is visualized.   LEFT VENTRICLE PLAX 2D LVIDd:         6.20 cm      Diastology LVIDs:         4.50 cm      LV e' medial:    7.29 cm/s LV PW:         1.00 cm      LV E/e' medial:  11.9 LV IVS:        1.10 cm      LV e' lateral:   10.80 cm/s LVOT diam:     2.20 cm      LV E/e' lateral: 8.1 LV SV:         94 LV SV Index:  46 LVOT Area:     3.80 cm  LV Volumes (MOD) LV vol d, MOD A2C: 138.0 ml LV vol d, MOD A4C: 176.0 ml LV vol s, MOD A2C: 67.4 ml LV vol s, MOD A4C: 77.3 ml LV SV MOD A2C:     70.6 ml LV SV MOD A4C:     176.0 ml LV SV MOD BP:      85.2 ml RIGHT VENTRICLE RV Basal diam:  4.00 cm RV S prime:     15.00 cm/s TAPSE (M-mode): 2.0 cm LEFT ATRIUM           Index        RIGHT ATRIUM           Index LA diam:      4.30 cm 2.10 cm/m   RA Area:     26.60 cm LA Vol (A2C): 39.6 ml 19.34 ml/m  RA Volume:   101.00 ml 49.33 ml/m LA Vol (A4C): 46.0 ml 22.47 ml/m  AORTIC VALVE AV Area (Vmax): 2.64 cm AV Vmax:        170.00 cm/s AV Peak Grad:   11.6 mmHg LVOT Vmax:      118.00 cm/s LVOT Vmean:     73.000 cm/s LVOT VTI:       0.248 m AI PHT:         548 msec  AORTA Ao Root diam: 4.10 cm MITRAL VALVE MV Area (PHT): 3.00 cm    SHUNTS MV Decel Time: 253 msec    Systemic VTI:  0.25 m MV E velocity: 87.00 cm/s  Systemic Diam: 2.20 cm MV A velocity: 63.40 cm/s MV E/A ratio:  1.37 Kate Sable MD Electronically signed by Kate Sable MD Signature Date/Time: 09/18/2021/3:40:39 PM    Final      Assessment/Plan: MSSA bacteremia with hx of having PPM = continue on linezolid 600mg  po bid since PIV site is lost. Will recommend that he gets TEE to see if any vegetation on cardiac lead since that would change his treatment course. Thus far, repeat blood cx are no growth today. If continues to be NGTD by Monday, then can consider to place picc line vs. Midline to finish out treatment course. It is too early to tell if he only needs 2 wks since clearance of bacteremia.  [facility] for Infectious Diseases Pager:  [phone]  09/18/2021, 4:57 PM

## 2021-09-18 NOTE — Progress Notes (Signed)
*  PRELIMINARY RESULTS* Echocardiogram 2D Echocardiogram has been performed.  Duane Owens 09/18/2021, 3:15 PM

## 2021-09-19 DIAGNOSIS — N171 Acute kidney failure with acute cortical necrosis: Secondary | ICD-10-CM | POA: Diagnosis not present

## 2021-09-19 DIAGNOSIS — L03113 Cellulitis of right upper limb: Secondary | ICD-10-CM | POA: Diagnosis not present

## 2021-09-19 DIAGNOSIS — I2510 Atherosclerotic heart disease of native coronary artery without angina pectoris: Secondary | ICD-10-CM | POA: Diagnosis not present

## 2021-09-19 DIAGNOSIS — N1831 Chronic kidney disease, stage 3a: Secondary | ICD-10-CM | POA: Diagnosis not present

## 2021-09-19 DIAGNOSIS — I480 Paroxysmal atrial fibrillation: Secondary | ICD-10-CM | POA: Diagnosis not present

## 2021-09-19 DIAGNOSIS — A419 Sepsis, unspecified organism: Secondary | ICD-10-CM | POA: Diagnosis not present

## 2021-09-19 DIAGNOSIS — A4101 Sepsis due to Methicillin susceptible Staphylococcus aureus: Secondary | ICD-10-CM

## 2021-09-19 LAB — CBC WITH DIFFERENTIAL/PLATELET
Abs Immature Granulocytes: 0.05 10*3/uL (ref 0.00–0.07)
Basophils Absolute: 0 10*3/uL (ref 0.0–0.1)
Basophils Relative: 1 %
Eosinophils Absolute: 0 10*3/uL (ref 0.0–0.5)
Eosinophils Relative: 0 %
HCT: 23 % — ABNORMAL LOW (ref 39.0–52.0)
Hemoglobin: 7.5 g/dL — ABNORMAL LOW (ref 13.0–17.0)
Immature Granulocytes: 1 %
Lymphocytes Relative: 24 %
Lymphs Abs: 1.9 10*3/uL (ref 0.7–4.0)
MCH: 29.9 pg (ref 26.0–34.0)
MCHC: 32.6 g/dL (ref 30.0–36.0)
MCV: 91.6 fL (ref 80.0–100.0)
Monocytes Absolute: 0.9 10*3/uL (ref 0.1–1.0)
Monocytes Relative: 11 %
Neutro Abs: 5 10*3/uL (ref 1.7–7.7)
Neutrophils Relative %: 63 %
Platelets: 136 10*3/uL — ABNORMAL LOW (ref 150–400)
RBC: 2.51 MIL/uL — ABNORMAL LOW (ref 4.22–5.81)
RDW: 16.4 % — ABNORMAL HIGH (ref 11.5–15.5)
WBC: 8 10*3/uL (ref 4.0–10.5)
nRBC: 0 % (ref 0.0–0.2)

## 2021-09-19 LAB — BASIC METABOLIC PANEL
Anion gap: 6 (ref 5–15)
BUN: 23 mg/dL (ref 8–23)
CO2: 20 mmol/L — ABNORMAL LOW (ref 22–32)
Calcium: 8.1 mg/dL — ABNORMAL LOW (ref 8.9–10.3)
Chloride: 112 mmol/L — ABNORMAL HIGH (ref 98–111)
Creatinine, Ser: 1.06 mg/dL (ref 0.61–1.24)
GFR, Estimated: >60 mL/min (ref >60)
Glucose, Bld: 98 mg/dL (ref 70–99)
Potassium: 3.6 mmol/L (ref 3.5–5.1)
Sodium: 138 mmol/L (ref 135–145)

## 2021-09-19 LAB — CULTURE, BLOOD (ROUTINE X 2)

## 2021-09-19 LAB — MAGNESIUM: Magnesium: 1.8 mg/dL (ref 1.7–2.4)

## 2021-09-19 LAB — PHOSPHORUS: Phosphorus: 2.4 mg/dL — ABNORMAL LOW (ref 2.5–4.6)

## 2021-09-19 MED ORDER — CYANOCOBALAMIN 1000 MCG/ML IJ SOLN
1000.0000 ug | Freq: Once | INTRAMUSCULAR | Status: AC
  Administered 2021-09-19: 1000 ug via INTRAMUSCULAR
  Filled 2021-09-19: qty 1

## 2021-09-19 NOTE — Progress Notes (Signed)
Progress Note   Patient: Duane Owens DOB: Sep 03, 1940 DOA: 09/15/2021     4 DOS: the patient was seen and examined on 09/19/2021   Brief hospital course: Duane Owens is a 81 y.o. male with medical history significant of a fib on Eliquis, hypertension, hyperlipidemia, GERD, gout, depression, varicose vein, sCHF with EF of 35%, prostate cancer (s/p of radiation therapy), anemia, chronic pain, thrombocytopenia, CAD, pacemaker placement, CKD-3, who present fall and weakness.  Was recently hospitalized at [facility] on 2/16 for 4 days for dizziness and syncope. Upon arriving the hospital, she had temperature of 100.5, leukocytosis of 1822, elevated PT/INR, elevated troponin 143, elevated AST ALT, creatinine 1.83. He developed hypotension, did not respond to IV fluids.  He was transferred to ICU and placed on pressor. Blood culture came back with MSSA.  Patient bacteremia most likely due to right arm cellulitis/superficial phlebitis.   Assessment and Plan: Septic shock secondary to MSSA septicemia secondary to cellulitis Right arm cellulitis with superficial phlebitis. Pneumonia ruled out. Appreciate consult from ID, patient was treated with cefazolin per ID consult. Changed to Zyvox on 2/25 as patient lost IV access. Repeated cultures so far negative. Will obtain TEE per recommendation from ID, TTE results reviewed, normal ejection fraction, no evidence of vegetation.   Acute kidney injury secondary to shock. Elevated liver enzymes secondary to septic shock. Metabolic acidosis secondary to acute kidney injury. Hypophosphatemia. Liver ultrasound did not show any evidence of liver cirrhosis. Condition has improved. Patient still has significant hypokalemia and hypophosphatemia.  We will continue oral treatment.  Continue oral sodium bicarbonate.  Conditions are improving.   Elevated troponin secondary to septic shock Coronary artery disease. Paroxysmal atrial fibrillation  with rapid ventricle response. Acute on chronic systolic congestive heart failure. Followed by cardiology.  Condition has improved.  Continue anticoagulation.   Severe anemia. Thrombocytopenia. B12 borderline, will give another shot of B12.  Iron level adequate. Hemoglobin dropped down to 7.3, will transfuse as needed. Thrombocytopenia is getting better.  No evidence of active bleeding.       Subjective:  Patient doing better today, he was able to walk to the bathroom yesterday. Currently does not have any fever or chills. Denies any short of breath or cough  Physical Exam: Vitals:   09/19/21 0000 09/19/21 0430 09/19/21 0435 09/19/21 0912  BP: (!) 111/52 (!) 113/48    Pulse: 61 (!) 59  64  Resp: 18 16    Temp: 98 F (36.7 C) 98.2 F (36.8 C)    TempSrc:      SpO2: 96% 98%    Weight:   82.5 kg   Height:       General exam: Appears calm and comfortable  Respiratory system: Clear to auscultation. Respiratory effort normal. Cardiovascular system: S1 & S2 heard, RRR. No JVD, murmurs, rubs, gallops or clicks. No pedal edema. Gastrointestinal system: Abdomen is nondistended, soft and nontender. No organomegaly or masses felt. Normal bowel sounds heard. Central nervous system: Alert and oriented. No focal neurological deficits. Extremities: Symmetric 5 x 5 power. Skin: No rashes, lesions or ulcers Psychiatry: Judgement and insight appear normal. Mood & affect appropriate.    Data Reviewed: Echocardiogram reviewed as above. All labs reviewed. Family Communication: daughter updated  Disposition: Status is: Inpatient Remains inpatient appropriate because: severity of diseases.       Planned Discharge Destination: Home with Home Health     Time spent: 35 minutes, more than 50% time involved in direct patient care.  Author: Sharen Hones, MD 09/19/2021 10:44 AM  For on call review [email or web address].

## 2021-09-19 NOTE — Progress Notes (Signed)
Mobility Specialist - Progress Note   09/19/21 1153  Mobility  Activity Ambulated with assistance in hallway;Stood at bedside;Dangled on edge of bed  Level of Assistance Modified independent, requires aide device or extra time  Assistive Device None  Distance Ambulated (ft) 320 ft  Activity Response Tolerated well  $Mobility charge 1 Mobility     Pre-mobility: 69 HR, 115/52 BP,98% SpO2 During mobility: 89 HR, 98% SpO2  Pt sleeping using RA upon arrival. Pt sat EOB and completed STS with ModI + extra time. Pt ambulated 313ft with CGA ---- slight LOB and SOB noted during second lap. Pt returned to bed with needs in reach.  Merrily Brittle Mobility Specialist 09/19/21, 11:57 AM

## 2021-09-19 NOTE — Progress Notes (Signed)
Progress Note  Patient Name: Duane Owens Date of Encounter: 09/19/2021  [facility] Cardiologist: [facility]  Subjective   No acute events overnight, denies chest pain, shortness of breath or palpitations.  Telemetry showing a paced rhythm  Inpatient Medications    Scheduled Meds:  allopurinol  300 mg Oral Daily   apixaban  5 mg Oral BID   atorvastatin  20 mg Oral Daily   Chlorhexidine Gluconate Cloth  6 each Topical Q0600   linezolid  600 mg Oral Q12H   metoprolol succinate  100 mg Oral Daily   nicotine  21 mg Transdermal Daily   pantoprazole  40 mg Oral Daily   potassium & sodium phosphates  2 packet Oral TID AC & HS   predniSONE  10 mg Oral Q breakfast   sodium bicarbonate  1,300 mg Oral QID   Continuous Infusions:  PRN Meds: acetaminophen, diphenhydrAMINE, traZODone   Vital Signs    Vitals:   09/19/21 0430 09/19/21 0435 09/19/21 0912 09/19/21 1132  BP: (!) 113/48   (!) 115/52  Pulse: (!) 59  64 68  Resp: 16   17  Temp: 98.2 F (36.8 C)   97.8 F (36.6 C)  TempSrc:    Oral  SpO2: 98%   98%  Weight:  82.5 kg    Height:        Intake/Output Summary (Last 24 hours) at 09/19/2021 1409 Last data filed at 09/19/2021 1022 Gross per 24 hour  Intake 1020 ml  Output 400 ml  Net 620 ml   Last 3 Weights 09/19/2021 09/17/2021 09/17/2021  Weight (lbs) 181 lb 14.4 oz 182 lb 1.6 oz 183 lb 10.3 oz  Weight (kg) 82.509 kg 82.6 kg 83.3 kg      Telemetry    A paced rhythm- Personally Reviewed  ECG     - Personally Reviewed  Physical Exam   GEN: No acute distress.   Neck: No JVD Cardiac: RRR Respiratory: Clear to auscultation bilaterally. GI: Soft, nontender, non-distended  MS: No edema; No deformity. Neuro:  Nonfocal  Psych: Normal affect   Labs    High Sensitivity Troponin:   Recent Labs  Lab 09/15/21 1554 09/15/21 2354 09/16/21 0144 09/16/21 0632 09/16/21 0738  TROPONINIHS 143* 117* 165* 136* 133*     Chemistry Recent Labs  Lab  09/15/21 1327 09/15/21 1554 09/17/21 0415 09/18/21 0547 09/19/21 0435  NA 133*   < > 136 138 138  K 3.3*   < > 3.9 3.7 3.6  CL 107   < > 115* 113* 112*  CO2 18*   < > 16* 16* 20*  GLUCOSE 154*   < > 112* 109* 98  BUN 34*   < > 29* 28* 23  CREATININE 1.83*   < > 1.12 0.94 1.06  CALCIUM 8.5*   < > 7.9* 7.9* 8.1*  MG  --    < > 2.4 2.1 1.8  PROT 6.6  --  5.3*  --   --   ALBUMIN 3.4*  --  2.5*  --   --   AST 141*  --  88*  --   --   ALT 71*  --  55*  --   --   ALKPHOS 101  --  75  --   --   BILITOT 1.2  --  0.8  --   --   GFRNONAA 37*   < > >60 >60 >60  ANIONGAP 8   < > 5 9 6    < > =  values in this interval not displayed.    Lipids  Recent Labs  Lab 09/16/21 0419  CHOL 70  TRIG 79  HDL 18*  LDLCALC 36  CHOLHDL 3.9    Hematology Recent Labs  Lab 09/17/21 0415 09/18/21 0547 09/19/21 0435  WBC 10.2 8.2 8.0  RBC 2.44* 2.39* 2.51*  HGB 7.6* 7.3* 7.5*  HCT 22.5* 21.8* 23.0*  MCV 92.2 91.2 91.6  MCH 31.1 30.5 29.9  MCHC 33.8 33.5 32.6  RDW 16.4* 16.2* 16.4*  PLT 118* 124* 136*   Thyroid No results for input(s): TSH, FREET4 in the last 168 hours.  BNP Recent Labs  Lab 09/15/21 1327  BNP 630.9*    DDimer No results for input(s): DDIMER in the last 168 hours.   Radiology    ECHOCARDIOGRAM COMPLETE  Result Date: 09/18/2021    ECHOCARDIOGRAM REPORT   Patient Name:   Duane Owens Date of Exam: 09/18/2021 Medical Rec #:  [id number]         Height:       72.0 in Accession #:    [id number]        Weight:       182.1 lb Date of Birth:  04/26/1941         BSA:          2.047 m Patient Age:    81 years          BP:           118/53 mmHg Patient Gender: M                 HR:           65 bpm. Exam Location:  [facility] Procedure: 2D Echo Indications:     Bacteremia R78.81  History:         Patient has no prior history of Echocardiogram examinations.  Sonographer:     Kathlen Brunswick RDCS Referring Phys:  [id number] Tsosie Billing Diagnosing Phys: Kate Sable MD  IMPRESSIONS  1. Left ventricular ejection fraction, by estimation, is 55 to 60%. The left ventricle has normal function. The left ventricle has no regional wall motion abnormalities. Left ventricular diastolic parameters were normal.  2. Right ventricular systolic function is normal. The right ventricular size is not well visualized.  3. Right atrial size was mild to moderately dilated.  4. The mitral valve is degenerative. Mild mitral valve regurgitation.  5. The aortic valve was not well visualized. Aortic valve regurgitation is mild. Aortic valve sclerosis/calcification is present, without any evidence of aortic stenosis.  6. Aortic dilatation noted. There is mild dilatation of the aortic root, measuring 41 mm. FINDINGS  Left Ventricle: Left ventricular ejection fraction, by estimation, is 55 to 60%. The left ventricle has normal function. The left ventricle has no regional wall motion abnormalities. The left ventricular internal cavity size was normal in size. There is  no left ventricular hypertrophy. Left ventricular diastolic parameters were normal. Right Ventricle: The right ventricular size is not well visualized. No increase in right ventricular wall thickness. Right ventricular systolic function is normal. Left Atrium: Left atrial size was normal in size. Right Atrium: Right atrial size was mild to moderately dilated. Pericardium: There is no evidence of pericardial effusion. Mitral Valve: The mitral valve is degenerative in appearance. There is mild calcification of the mitral valve leaflet(s). Mild mitral annular calcification. Mild mitral valve regurgitation. Tricuspid Valve: The tricuspid valve is not well visualized. Tricuspid valve regurgitation is not demonstrated. Aortic Valve:  The aortic valve was not well visualized. Aortic valve regurgitation is mild. Aortic regurgitation PHT measures 548 msec. Aortic valve sclerosis/calcification is present, without any evidence of aortic stenosis. Aortic  valve peak gradient measures 11.6 mmHg. Pulmonic Valve: The pulmonic valve was not well visualized. Pulmonic valve regurgitation is not visualized. Aorta: Aortic dilatation noted. There is mild dilatation of the aortic root, measuring 41 mm. Venous: The inferior vena cava was not well visualized. IAS/Shunts: No atrial level shunt detected by color flow Doppler. Additional Comments: A device lead is visualized.  LEFT VENTRICLE PLAX 2D LVIDd:         6.20 cm      Diastology LVIDs:         4.50 cm      LV e' medial:    7.29 cm/s LV PW:         1.00 cm      LV E/e' medial:  11.9 LV IVS:        1.10 cm      LV e' lateral:   10.80 cm/s LVOT diam:     2.20 cm      LV E/e' lateral: 8.1 LV SV:         94 LV SV Index:   46 LVOT Area:     3.80 cm  LV Volumes (MOD) LV vol d, MOD A2C: 138.0 ml LV vol d, MOD A4C: 176.0 ml LV vol s, MOD A2C: 67.4 ml LV vol s, MOD A4C: 77.3 ml LV SV MOD A2C:     70.6 ml LV SV MOD A4C:     176.0 ml LV SV MOD BP:      85.2 ml RIGHT VENTRICLE RV Basal diam:  4.00 cm RV S prime:     15.00 cm/s TAPSE (M-mode): 2.0 cm LEFT ATRIUM           Index        RIGHT ATRIUM           Index LA diam:      4.30 cm 2.10 cm/m   RA Area:     26.60 cm LA Vol (A2C): 39.6 ml 19.34 ml/m  RA Volume:   101.00 ml 49.33 ml/m LA Vol (A4C): 46.0 ml 22.47 ml/m  AORTIC VALVE AV Area (Vmax): 2.64 cm AV Vmax:        170.00 cm/s AV Peak Grad:   11.6 mmHg LVOT Vmax:      118.00 cm/s LVOT Vmean:     73.000 cm/s LVOT VTI:       0.248 m AI PHT:         548 msec  AORTA Ao Root diam: 4.10 cm MITRAL VALVE MV Area (PHT): 3.00 cm    SHUNTS MV Decel Time: 253 msec    Systemic VTI:  0.25 m MV E velocity: 87.00 cm/s  Systemic Diam: 2.20 cm MV A velocity: 63.40 cm/s MV E/A ratio:  1.37 Kate Sable MD Electronically signed by Kate Sable MD Signature Date/Time: 09/18/2021/3:40:39 PM    Final     Cardiac Studies   TTE 09/18/2021 1. Left ventricular ejection fraction, by estimation, is 55 to 60%. The  left ventricle has  normal function. The left ventricle has no regional  wall motion abnormalities. Left ventricular diastolic parameters were  normal.   2. Right ventricular systolic function is normal. The right ventricular  size is not well visualized.   3. Right atrial size was mild to moderately dilated.   4. The mitral  valve is degenerative. Mild mitral valve regurgitation.   5. The aortic valve was not well visualized. Aortic valve regurgitation  is mild. Aortic valve sclerosis/calcification is present, without any  evidence of aortic stenosis.   6. Aortic dilatation noted. There is mild dilatation of the aortic root,  measuring 41 mm.   Patient Profile     81 y.o. male with history of CAD/CABG, ischemic cardiomyopathy EF 35%, paroxysmal atrial fibrillation, AICD, GI bleed, presenting with weakness and fall, found to have right upper extremity cellulitis, MSSA bacteremia and A-fib RVR.  Being seen for A-fib RVR.  Assessment & Plan    1.  A-fib RVR -Rhythm shows a paced. -Cont Toprol-XL 100 mg daily. -Continue Eliquis , hemoglobin stable   2.  MSSA bacteremia -AICD in place -Plan for TEE tomorrow to evaluate endocarditis  3.  Ischemic cardiomyopathy EF 35% -Toprol-XL 100 mg daily -Did not tolerate ACE/ARB/Arni due to hypotension. -Consider GDMT as BP permits   4.  CAD/CABG -Denies chest pain -Eliquis, Lipitor   5.  Anemia, history of GI bleed -History of GI bleed -Monitor H&H, hold Eliquis if H&H continues to trend downwards -Consider GI input as per primary team.   Greater than 50% was spent in counseling and coordination of care with patient Total encounter time 50 minutes or more  Shared Decision Making/Informed Consent The risks [esophageal damage, perforation (1:10,000 risk), bleeding, pharyngeal hematoma as well as other potential complications associated with conscious sedation including aspiration, arrhythmia, respiratory failure and death], benefits (treatment guidance and  diagnostic support) and alternatives of a transesophageal echocardiogram were discussed in detail with Duane Owens and he is willing to proceed.      Signed, Kate Sable, MD  09/19/2021, 2:09 PM

## 2021-09-20 ENCOUNTER — Inpatient Hospital Stay: Payer: Medicare HMO

## 2021-09-20 ENCOUNTER — Inpatient Hospital Stay (HOSPITAL_COMMUNITY)
Admit: 2021-09-20 | Discharge: 2021-09-20 | Disposition: A | Discharge status: Home / Self Care | Payer: Medicare HMO | Attending: Cardiology | Admitting: Cardiology

## 2021-09-20 ENCOUNTER — Encounter
Admission: EM | Disposition: A | Discharge status: Home Health | Payer: Self-pay | Source: Home / Self Care | Attending: Internal Medicine

## 2021-09-20 DIAGNOSIS — N171 Acute kidney failure with acute cortical necrosis: Secondary | ICD-10-CM | POA: Diagnosis not present

## 2021-09-20 DIAGNOSIS — I361 Nonrheumatic tricuspid (valve) insufficiency: Secondary | ICD-10-CM | POA: Diagnosis not present

## 2021-09-20 DIAGNOSIS — R7881 Bacteremia: Secondary | ICD-10-CM

## 2021-09-20 DIAGNOSIS — I340 Nonrheumatic mitral (valve) insufficiency: Secondary | ICD-10-CM | POA: Diagnosis not present

## 2021-09-20 DIAGNOSIS — A419 Sepsis, unspecified organism: Secondary | ICD-10-CM | POA: Diagnosis not present

## 2021-09-20 DIAGNOSIS — A4101 Sepsis due to Methicillin susceptible Staphylococcus aureus: Secondary | ICD-10-CM | POA: Diagnosis not present

## 2021-09-20 DIAGNOSIS — L03113 Cellulitis of right upper limb: Secondary | ICD-10-CM | POA: Diagnosis not present

## 2021-09-20 DIAGNOSIS — I480 Paroxysmal atrial fibrillation: Secondary | ICD-10-CM | POA: Diagnosis not present

## 2021-09-20 HISTORY — PX: TEE WITHOUT CARDIOVERSION: SHX5443

## 2021-09-20 LAB — BASIC METABOLIC PANEL
Anion gap: 9 (ref 5–15)
BUN: 19 mg/dL (ref 8–23)
CO2: 21 mmol/L — ABNORMAL LOW (ref 22–32)
Calcium: 8.2 mg/dL — ABNORMAL LOW (ref 8.9–10.3)
Chloride: 111 mmol/L (ref 98–111)
Creatinine, Ser: 0.95 mg/dL (ref 0.61–1.24)
GFR, Estimated: >60 mL/min (ref >60)
Glucose, Bld: 86 mg/dL (ref 70–99)
Potassium: 3.7 mmol/L (ref 3.5–5.1)
Sodium: 141 mmol/L (ref 135–145)

## 2021-09-20 LAB — MAGNESIUM: Magnesium: 1.8 mg/dL (ref 1.7–2.4)

## 2021-09-20 LAB — GLUCOSE, RANDOM: Glucose, Bld: 139 mg/dL — ABNORMAL HIGH (ref 70–99)

## 2021-09-20 LAB — PHOSPHORUS: Phosphorus: 2.6 mg/dL (ref 2.5–4.6)

## 2021-09-20 LAB — GLUCOSE, CAPILLARY: Glucose-Capillary: 145 mg/dL — ABNORMAL HIGH (ref 70–99)

## 2021-09-20 LAB — HOMOCYSTEINE: Homocysteine: 11.6 umol/L (ref 0.0–21.3)

## 2021-09-20 SURGERY — ECHOCARDIOGRAM, TRANSESOPHAGEAL
Anesthesia: Moderate Sedation

## 2021-09-20 MED ORDER — BUTAMBEN-TETRACAINE-BENZOCAINE 2-2-14 % EX AERO
INHALATION_SPRAY | CUTANEOUS | Status: AC
  Filled 2021-09-20: qty 5

## 2021-09-20 MED ORDER — VITAMIN B-12 1000 MCG PO TABS
1000.0000 ug | ORAL_TABLET | Freq: Every day | ORAL | Status: DC
  Administered 2021-09-21 – 2021-09-22 (×2): 1000 ug via ORAL
  Filled 2021-09-20 (×2): qty 1

## 2021-09-20 MED ORDER — MIDAZOLAM HCL 2 MG/2ML IJ SOLN
INTRAMUSCULAR | Status: AC
  Filled 2021-09-20: qty 4

## 2021-09-20 MED ORDER — MIDAZOLAM HCL 2 MG/2ML IJ SOLN
INTRAMUSCULAR | Status: AC | PRN
  Administered 2021-09-20 (×2): 1 mg via INTRAVENOUS

## 2021-09-20 MED ORDER — FENTANYL CITRATE (PF) 100 MCG/2ML IJ SOLN
INTRAMUSCULAR | Status: AC | PRN
Start: 2021-09-20 — End: 2021-09-20
  Administered 2021-09-20 (×2): 25 ug via INTRAVENOUS

## 2021-09-20 MED ORDER — SODIUM CHLORIDE 0.9 % IV SOLN: INTRAVENOUS | Status: DC

## 2021-09-20 MED ORDER — FENTANYL CITRATE (PF) 100 MCG/2ML IJ SOLN
INTRAMUSCULAR | Status: AC
  Filled 2021-09-20: qty 2

## 2021-09-20 MED ORDER — LIDOCAINE VISCOUS HCL 2 % MT SOLN
OROMUCOSAL | Status: AC
  Filled 2021-09-20: qty 15

## 2021-09-20 MED ORDER — CEFAZOLIN SODIUM-DEXTROSE 2-4 GM/100ML-% IV SOLN
2.0000 g | Freq: Three times a day (TID) | INTRAVENOUS | Status: DC
  Administered 2021-09-20 – 2021-09-22 (×7): 2 g via INTRAVENOUS
  Filled 2021-09-20 (×8): qty 100

## 2021-09-20 MED ORDER — SODIUM CHLORIDE FLUSH 0.9 % IV SOLN
INTRAVENOUS | Status: AC
  Filled 2021-09-20: qty 10

## 2021-09-20 MED ORDER — SODIUM CHLORIDE 0.9 % IV SOLN
INTRAVENOUS | Status: AC | PRN
Start: 2021-09-20 — End: 2021-09-20
  Administered 2021-09-20: 10 mL/h via INTRAVENOUS

## 2021-09-20 NOTE — Progress Notes (Signed)
Patient was of floor for procedure. Unable to put in the PIV access at this time. HS [employer]

## 2021-09-20 NOTE — Progress Notes (Signed)
*  PRELIMINARY RESULTS* Echocardiogram 2D Echocardiogram has been performed.  Duane Owens 09/20/2021, 1:28 PM

## 2021-09-20 NOTE — Progress Notes (Signed)
OT Cancellation Note  Patient Details Name: Duane Owens MRN: [id number] DOB: 07/17/1941   Cancelled Treatment:    Reason Eval/Treat Not Completed: Other (comment). On 2nd attempt, chart reviewed and pt pending imaging to rule out a LUE DVT. Will hold OT today and re-attempt next date as medically appropriate.   Ardeth Perfect., MPH, MS, OTR/L ascom [phone] 09/20/21, 3:03 PM

## 2021-09-20 NOTE — Progress Notes (Signed)
Brief ID note:  Patient with MSSA bacteremia due to right arm cellulitis and superficial phlebitis.  DVT study done 2/23 notes superficial venous thrombosis of right cephalic vein.  Would recommend treating as septic thrombophlebitis complicated by bacteremia in patient with known ICD in place.  TEE done today was negative for vegetations. Repeat blood cultures are negative as of 09/17/21.  IV access was lost over the weekend and patient is now on linezolid since 2/25.  Recommend PICC line and resuming IV antibiotics with cefazolin. Will plan for 4 weeks from negative blood cx. Some discussion on going regarding cefazolin and discharge vs possibly once daily daptomycin to ease administration.  Home health will evaluate.   Duane Owens for Infectious Disease [facility] 09/20/2021, 1:46 PM

## 2021-09-20 NOTE — Progress Notes (Signed)
*  PRELIMINARY RESULTS* Echocardiogram Echocardiogram Transesophageal has been performed.  Duane Owens 09/20/2021, 1:29 PM

## 2021-09-20 NOTE — Progress Notes (Addendum)
°  Progress Note   Patient: Duane Owens HYI:[id number] DOB: 1941/02/10 DOA: 09/15/2021     5 DOS: the patient was seen and examined on 09/20/2021   Brief hospital course: Duane Owens is a 81 y.o. male with medical history significant of a fib on Eliquis, hypertension, hyperlipidemia, GERD, gout, depression, varicose vein, sCHF with EF of 35%, prostate cancer (s/p of radiation therapy), anemia, chronic pain, thrombocytopenia, CAD, pacemaker placement, CKD-3, who present fall and weakness.  Was recently hospitalized at [facility] on 2/16 for 4 days for dizziness and syncope. Upon arriving the hospital, she had temperature of 100.5, leukocytosis of 1822, elevated PT/INR, elevated troponin 143, elevated AST ALT, creatinine 1.83. He developed hypotension, did not respond to IV fluids.  He was transferred to ICU and placed on pressor. Blood culture came back with MSSA.  Patient bacteremia most likely due to right arm cellulitis/superficial phlebitis.   Assessment and Plan: Septic shock secondary to MSSA septicemia secondary to cellulitis Right arm cellulitis with superficial phlebitis. Pneumonia ruled out. Appreciate consult from ID, patient was treated with cefazolin per ID consult. Changed to Zyvox on 2/25 as patient lost IV access. Repeated culture negative.  Transthoracic echocardiogram did not see any vegetation. We will continue Zyvox for now, TEE will be performed today per cardiology.   Acute kidney injury secondary to shock. Elevated liver enzymes secondary to septic shock. Metabolic acidosis secondary to acute kidney injury. Hypophosphatemia. Conditions mostly improved.  Still on oral bicarbonate.  Recheck a BMP, mag and Phos tomorrow.   Elevated troponin secondary to septic shock Coronary artery disease. Paroxysmal atrial fibrillation with rapid ventricle response. Acute on chronic systolic congestive heart failure. Condition has stabilized, followed by cardiology.   Severe  anemia. Thrombocytopenia. Platelet count much improved.  Hemoglobin still low, received 2 doses B12 shot, still pending homocystine level was borderline B12 level.  I will start oral B12 today.     Subjective:  Patient doing well today, n.p.o. pending TEE. No abdominal pain or nausea vomiting. No dysuria or hematuria. No short of breath or hypoxia.  Physical Exam: Vitals:   09/19/21 1501 09/19/21 1958 09/20/21 0406 09/20/21 0820  BP: (!) 101/48 (!) 104/50 (!) 123/41 (!) 118/49  Pulse: 100 63 61 (!) 59  Resp: 17 18 16    Temp: 98.1 F (36.7 C) 97.9 F (36.6 C) 98.7 F (37.1 C) 97.9 F (36.6 C)  TempSrc:      SpO2: 98% 98% 96% 100%  Weight:   83.2 kg   Height:       General exam: Appears calm and comfortable  Respiratory system: Clear to auscultation. Respiratory effort normal. Cardiovascular system: S1 & S2 heard, RRR. No JVD, murmurs, rubs, gallops or clicks. No pedal edema. Gastrointestinal system: Abdomen is nondistended, soft and nontender. No organomegaly or masses felt. Normal bowel sounds heard. Central nervous system: Alert and oriented. No focal neurological deficits. Extremities: Symmetric 5 x 5 power. Skin: No rashes, lesions or ulcers Psychiatry: Judgement and insight appear normal. Mood & affect appropriate.    Data Reviewed: Reviewed all labs. Family Communication: Daughter updated on the phone  Disposition: Status is: Inpatient Remains inpatient appropriate because: Severity of disease.          Planned Discharge Destination: Home with Home Health     Time spent: 27 minutes  Author: Sharen Hones, MD 09/20/2021 10:48 AM  For on call review [email or web address].

## 2021-09-20 NOTE — Care Management Important Message (Signed)
Important Message  Patient Details  Name: Duane Owens MRN: [id number] Date of Birth: 06-23-1941   Medicare Important Message Given:  Yes     Juliann Pulse A Besnik Febus 09/20/2021, 1:49 PM

## 2021-09-20 NOTE — TOC Progression Note (Addendum)
Transition of Care [facility]) - Progression Note    Patient Details  Name: Duane Owens MRN: [id number] Date of Birth: April 22, 1941  Transition of Care [facility] CM/SW [place], RN Phone Number: 09/20/2021, 1:57 PM  Clinical Narrative:  Patient may require home infusion antibiotics, Carolynn Sayers notified. [facility] consents to HHRN/OT. TOC to continue to track for needs.     Expected Discharge Plan: [place] Barriers to Discharge: Continued Medical Work up  Expected Discharge Plan and Services Expected Discharge Plan: [place]   Discharge Planning Services: CM Consult Post Acute Care Choice: [place] arrangements for the past 2 months: Single Family Home                 DME Arranged: N/A DME Agency: NA                   Social Determinants of Health (SDOH) Interventions    Readmission Risk Interventions No flowsheet data found.

## 2021-09-20 NOTE — Procedures (Addendum)
Transesophageal Echocardiogram :  Indication: mssa bacteremia Requesting/ordering  physician:   Procedure: 10 ml of viscous lidocaine were given orally to provide local anesthesia to the oropharynx. The patient was positioned supine on the left side, bite block provided. The patient was moderately sedated with the doses of versed and fentanyl as detailed below.  Using digital technique an omniplane probe was advanced into the esophagus without incident.   Moderate sedation: 1. Sedation used:  Versed: 2mg , Fentanyl: 20mcg 2. Time administered:  1 pm  Time when patient started recovery: 1:20pm 3. I was face to face during this time 79mins  See report in EPIC  for complete details: In brief, imaging revealed normal LV function with no RWMAs and no mural apical thrombus.  Estimated ejection fraction was 55%.  Right sided cardiac chambers were normal with no evidence of pulmonary hypertension.  There is no evidence of vegetation or endocarditis  Mild to moderate aortic regurgitation  Mild to moderate mitral regurgitation  Imaging of the septum showed no ASD or VSD 2D and color flow confirmed no PFO  The LA was well visualized in orthogonal views.  There was no spontaneous contrast and no thrombus in the LA and LA appendage     Duane Owens 09/20/2021 1:22 PM

## 2021-09-20 NOTE — Progress Notes (Signed)
OT Cancellation Note  Patient Details Name: Duane Owens MRN: [id number] DOB: 10-09-1940   Cancelled Treatment:    Reason Eval/Treat Not Completed: Patient at procedure or test/ unavailable. Pt out of the room. Will re-attempt OT tx at later date/time as pt is available.   Ardeth Perfect., MPH, MS, OTR/L ascom [phone] 09/20/21, 12:35 PM

## 2021-09-20 NOTE — Progress Notes (Signed)
Progress Note  Patient Name: Duane Owens Date of Encounter: 09/20/2021  [facility] Cardiologist: [facility]  Subjective   No chest pain, dyspnea, or palpitations. Feels fatigued. He is for TEE today.   Inpatient Medications    Scheduled Meds:  allopurinol  300 mg Oral Daily   apixaban  5 mg Oral BID   atorvastatin  20 mg Oral Daily   Chlorhexidine Gluconate Cloth  6 each Topical Q0600   linezolid  600 mg Oral Q12H   metoprolol succinate  100 mg Oral Daily   nicotine  21 mg Transdermal Daily   pantoprazole  40 mg Oral Daily   potassium & sodium phosphates  2 packet Oral TID AC & HS   predniSONE  10 mg Oral Q breakfast   sodium bicarbonate  1,300 mg Oral QID   Continuous Infusions:  sodium chloride     PRN Meds: acetaminophen, diphenhydrAMINE, traZODone   Vital Signs    Vitals:   09/19/21 1132 09/19/21 1501 09/19/21 1958 09/20/21 0406  BP: (!) 115/52 (!) 101/48 (!) 104/50 (!) 123/41  Pulse: 68 100 63 61  Resp: 17 17 18 16   Temp: 97.8 F (36.6 C) 98.1 F (36.7 C) 97.9 F (36.6 C) 98.7 F (37.1 C)  TempSrc: Oral     SpO2: 98% 98% 98% 96%  Weight:    83.2 kg  Height:        Intake/Output Summary (Last 24 hours) at 09/20/2021 0747 Last data filed at 09/19/2021 1421 Gross per 24 hour  Intake 480 ml  Output --  Net 480 ml    Last 3 Weights 09/20/2021 09/19/2021 09/17/2021  Weight (lbs) 183 lb 6.4 oz 181 lb 14.4 oz 182 lb 1.6 oz  Weight (kg) 83.19 kg 82.509 kg 82.6 kg      Telemetry    SR with intermittent A-paced rhythm and PVCs - Personally Reviewed  ECG    No new tracings - Personally Reviewed  Physical Exam   GEN: No acute distress.   Neck: No JVD Cardiac: RRR, no murmurs, rubs, or gallops. No clubbing, cyanosis, edema. DP/PT 1+ and equal bilaterally. Respiratory: Clear to auscultation bilaterally. GI: Soft, nontender, non-distended  MS: No edema; No deformity. Neuro:  Nonfocal  Psych: Normal affect   Labs    High Sensitivity Troponin:    Recent Labs  Lab 09/15/21 1554 09/15/21 2354 09/16/21 0144 09/16/21 0632 09/16/21 0738  TROPONINIHS 143* 117* 165* 136* 133*      Chemistry Recent Labs  Lab 09/15/21 1327 09/15/21 1554 09/17/21 0415 09/18/21 0547 09/19/21 0435 09/20/21 0422  NA 133*   < > 136 138 138 141  K 3.3*   < > 3.9 3.7 3.6 3.7  CL 107   < > 115* 113* 112* 111  CO2 18*   < > 16* 16* 20* 21*  GLUCOSE 154*   < > 112* 109* 98 46*  BUN 34*   < > 29* 28* 23 19  CREATININE 1.83*   < > 1.12 0.94 1.06 0.95  CALCIUM 8.5*   < > 7.9* 7.9* 8.1* 8.2*  MG  --    < > 2.4 2.1 1.8 1.8  PROT 6.6  --  5.3*  --   --   --   ALBUMIN 3.4*  --  2.5*  --   --   --   AST 141*  --  88*  --   --   --   ALT 71*  --  55*  --   --   --  ALKPHOS 101  --  75  --   --   --   BILITOT 1.2  --  0.8  --   --   --   GFRNONAA 37*   < > >60 >60 >60 >60  ANIONGAP 8   < > 5 9 6 9    < > = values in this interval not displayed.     Lipids  Recent Labs  Lab 09/16/21 0419  CHOL 70  TRIG 79  HDL 18*  LDLCALC 36  CHOLHDL 3.9     Hematology Recent Labs  Lab 09/17/21 0415 09/18/21 0547 09/19/21 0435  WBC 10.2 8.2 8.0  RBC 2.44* 2.39* 2.51*  HGB 7.6* 7.3* 7.5*  HCT 22.5* 21.8* 23.0*  MCV 92.2 91.2 91.6  MCH 31.1 30.5 29.9  MCHC 33.8 33.5 32.6  RDW 16.4* 16.2* 16.4*  PLT 118* 124* 136*    Thyroid No results for input(s): TSH, FREET4 in the last 168 hours.  BNP Recent Labs  Lab 09/15/21 1327  BNP 630.9*     DDimer No results for input(s): DDIMER in the last 168 hours.   Radiology     Cardiac Studies   TTE 09/18/2021 1. Left ventricular ejection fraction, by estimation, is 55 to 60%. The  left ventricle has normal function. The left ventricle has no regional  wall motion abnormalities. Left ventricular diastolic parameters were  normal.   2. Right ventricular systolic function is normal. The right ventricular  size is not well visualized.   3. Right atrial size was mild to moderately dilated.   4. The  mitral valve is degenerative. Mild mitral valve regurgitation.   5. The aortic valve was not well visualized. Aortic valve regurgitation  is mild. Aortic valve sclerosis/calcification is present, without any  evidence of aortic stenosis.   6. Aortic dilatation noted. There is mild dilatation of the aortic root,  measuring 41 mm.   Patient Profile     81 y.o. male with history of CAD/CABG, ischemic cardiomyopathy EF 35%, paroxysmal atrial fibrillation, AICD, GI bleed, presenting with weakness and fall, found to have right upper extremity cellulitis, MSSA bacteremia and A-fib RVR.  Being seen for A-fib RVR.  Assessment & Plan    1.  A-fib RVR -Rhythm shows a paced rhythm -Continue Toprol-XL 100 mg daily -Continue Eliquis , hemoglobin stable   2.  MSSA bacteremia -NPO -ABX per IM -Plan for TEE today, scheduled for 12:30 to evaluate endocarditis  3.  Ischemic cardiomyopathy -Prior EF 35%, echo this admission with normalization of LVSF with an EF of 55-60% -Status post ICD -Toprol-XL 100 mg daily -Did not tolerate ACE/ARB/Arni due to hypotension. -Escalate GDMT as BP permits   4.  CAD s/p CABG -Denies chest pain -Eliquis, Lipitor, Toprol   5.  Anemia, history of GI bleed -Stable -History of GI bleed -Monitor H&H, hold Eliquis if H&H continues to trend downwards -Consider GI input as per primary team.     Shared Decision Making/Informed Consent The risks [esophageal damage, perforation (1:10,000 risk), bleeding, pharyngeal hematoma as well as other potential complications associated with conscious sedation including aspiration, arrhythmia, respiratory failure and death], benefits (treatment guidance and diagnostic support) and alternatives of a transesophageal echocardiogram were discussed in detail with Duane Owens and he is willing to proceed.      Signed, Christell Faith, PA-C  09/20/2021, 7:47 AM

## 2021-09-21 ENCOUNTER — Encounter: Payer: Self-pay | Admitting: Cardiology

## 2021-09-21 ENCOUNTER — Inpatient Hospital Stay: Payer: Self-pay

## 2021-09-21 DIAGNOSIS — A419 Sepsis, unspecified organism: Secondary | ICD-10-CM | POA: Diagnosis not present

## 2021-09-21 DIAGNOSIS — N171 Acute kidney failure with acute cortical necrosis: Secondary | ICD-10-CM | POA: Diagnosis not present

## 2021-09-21 DIAGNOSIS — I5022 Chronic systolic (congestive) heart failure: Secondary | ICD-10-CM | POA: Diagnosis not present

## 2021-09-21 DIAGNOSIS — L03113 Cellulitis of right upper limb: Secondary | ICD-10-CM | POA: Diagnosis not present

## 2021-09-21 DIAGNOSIS — A4101 Sepsis due to Methicillin susceptible Staphylococcus aureus: Secondary | ICD-10-CM | POA: Diagnosis not present

## 2021-09-21 LAB — CBC WITH DIFFERENTIAL/PLATELET
Abs Immature Granulocytes: 0.2 10*3/uL — ABNORMAL HIGH (ref 0.00–0.07)
Basophils Absolute: 0 10*3/uL (ref 0.0–0.1)
Basophils Relative: 0 %
Eosinophils Absolute: 0.1 10*3/uL (ref 0.0–0.5)
Eosinophils Relative: 1 %
HCT: 22.8 % — ABNORMAL LOW (ref 39.0–52.0)
Hemoglobin: 7.4 g/dL — ABNORMAL LOW (ref 13.0–17.0)
Immature Granulocytes: 2 %
Lymphocytes Relative: 24 %
Lymphs Abs: 2.5 10*3/uL (ref 0.7–4.0)
MCH: 30.1 pg (ref 26.0–34.0)
MCHC: 32.5 g/dL (ref 30.0–36.0)
MCV: 92.7 fL (ref 80.0–100.0)
Monocytes Absolute: 1 10*3/uL (ref 0.1–1.0)
Monocytes Relative: 9 %
Neutro Abs: 6.6 10*3/uL (ref 1.7–7.7)
Neutrophils Relative %: 64 %
Platelets: 186 10*3/uL (ref 150–400)
RBC: 2.46 MIL/uL — ABNORMAL LOW (ref 4.22–5.81)
RDW: 16.1 % — ABNORMAL HIGH (ref 11.5–15.5)
WBC: 10.4 10*3/uL (ref 4.0–10.5)
nRBC: 0 % (ref 0.0–0.2)

## 2021-09-21 LAB — MAGNESIUM: Magnesium: 1.8 mg/dL (ref 1.7–2.4)

## 2021-09-21 LAB — PHOSPHORUS: Phosphorus: 1.9 mg/dL — ABNORMAL LOW (ref 2.5–4.6)

## 2021-09-21 LAB — BASIC METABOLIC PANEL
Anion gap: 6 (ref 5–15)
BUN: 15 mg/dL (ref 8–23)
CO2: 22 mmol/L (ref 22–32)
Calcium: 8 mg/dL — ABNORMAL LOW (ref 8.9–10.3)
Chloride: 111 mmol/L (ref 98–111)
Creatinine, Ser: 1.02 mg/dL (ref 0.61–1.24)
GFR, Estimated: >60 mL/min (ref >60)
Glucose, Bld: 133 mg/dL — ABNORMAL HIGH (ref 70–99)
Potassium: 3.3 mmol/L — ABNORMAL LOW (ref 3.5–5.1)
Sodium: 139 mmol/L (ref 135–145)

## 2021-09-21 MED ORDER — PHOS-NAK 280-160-250 MG PO PACK: 1.0000 | PACK | Freq: Three times a day (TID) | ORAL | 0 refills | Status: AC

## 2021-09-21 MED ORDER — POTASSIUM CHLORIDE CRYS ER 20 MEQ PO TBCR
40.0000 meq | EXTENDED_RELEASE_TABLET | ORAL | Status: AC
  Administered 2021-09-21 (×2): 40 meq via ORAL
  Filled 2021-09-21 (×2): qty 2

## 2021-09-21 MED ORDER — SODIUM BICARBONATE 650 MG PO TABS: 650.0000 mg | ORAL_TABLET | Freq: Two times a day (BID) | ORAL | 0 refills | Status: AC

## 2021-09-21 MED ORDER — CEFAZOLIN IV (FOR PTA / DISCHARGE USE ONLY): 6.0000 g | INTRAVENOUS | 0 refills | Status: AC

## 2021-09-21 MED ORDER — POTASSIUM PHOSPHATES 15 MMOLE/5ML IV SOLN
30.0000 mmol | Freq: Once | INTRAVENOUS | Status: AC
  Administered 2021-09-21: 30 mmol via INTRAVENOUS
  Filled 2021-09-21: qty 10

## 2021-09-21 MED ORDER — SODIUM BICARBONATE 650 MG PO TABS
650.0000 mg | ORAL_TABLET | Freq: Two times a day (BID) | ORAL | Status: DC
  Administered 2021-09-21 – 2021-09-22 (×2): 650 mg via ORAL
  Filled 2021-09-21 (×2): qty 1

## 2021-09-21 NOTE — H&P (Signed)
° °  Patient Status: [place] - In-pt  Assessment and Plan: Patient in need of venous access for long term antibiotic therapy and unable to have PICC secondary to RUE cellulitis and left sided pacemaker. Request received for image guided IJ tunneled central catheter, patient denies any previous history of central lines. He denies any current chest pain or shortness of breath and denies any known complications to moderate sedation.  ______________________________________________________________________   History of Present Illness: Duane Owens is a 81 y.o. male who has been admitted for MSSA bacteremia most likely secondary to right arm superficial phlebitis/cellulitis. Patient is now ready for discharge and in need of tunneled central line access for antibiotic therapy.   Allergies and medications reviewed.   Review of Systems: A 12 point ROS discussed and pertinent positives are indicated in the HPI above.  All other systems are negative.  Review of Systems  Vital Signs: BP (!) 128/49 (BP Location: Left Arm)    Pulse 61    Temp 98.3 F (36.8 C)    Resp 18    Ht 6' (1.829 m)    Wt 183 lb 6.4 oz (83.2 kg)    SpO2 97%    BMI 24.87 kg/m   Physical Exam Constitutional:      Appearance: Normal appearance.  HENT:     Head: Normocephalic and atraumatic.  Cardiovascular:     Rate and Rhythm: Normal rate and regular rhythm.  Pulmonary:     Effort: Pulmonary effort is normal.     Breath sounds: Normal breath sounds.  Neurological:     Mental Status: He is alert and oriented to person, place, and time.    Imaging reviewed.   Labs:  COAGS: Recent Labs    09/15/21 1327  INR 2.0*  APTT 44*    BMP: Recent Labs    09/18/21 0547 09/19/21 0435 09/20/21 0422 09/20/21 1505 09/21/21 0611  NA 138 138 141  --  139  K 3.7 3.6 3.7  --  3.3*  CL 113* 112* 111  --  111  CO2 16* 20* 21*  --  22  GLUCOSE 109* 98 86 139* 133*  BUN 28* 23 19  --  15  CALCIUM 7.9* 8.1* 8.2*  --  8.0*   CREATININE 0.94 1.06 0.95  --  1.02  GFRNONAA >60 >60 >60  --  >60    Electronically Signed: Hedy Jacob, PA-C 09/21/2021, 4:18 PM   I spent a total of 15 minutes in face to face in clinical consultation, greater than 50% of which was counseling/coordinating care for venous access.

## 2021-09-21 NOTE — Progress Notes (Signed)
Progress Note  Patient Name: Duane Owens Date of Encounter: 09/21/2021  Primary Cardiologist: [facility]  Subjective   No angina, dyspnea, palpitations, dizziness, presyncope, or syncope. TEE performed yesterday without valvular or lead vegetation noted.   Inpatient Medications    Scheduled Meds:  allopurinol  300 mg Oral Daily   apixaban  5 mg Oral BID   atorvastatin  20 mg Oral Daily   Chlorhexidine Gluconate Cloth  6 each Topical Q0600   metoprolol succinate  100 mg Oral Daily   nicotine  21 mg Transdermal Daily   pantoprazole  40 mg Oral Daily   potassium chloride  40 mEq Oral Q2H   predniSONE  10 mg Oral Q breakfast   sodium bicarbonate  650 mg Oral BID   vitamin B-12  1,000 mcg Oral Daily   Continuous Infusions:   ceFAZolin (ANCEF) IV 2 g (09/21/21 0513)   potassium PHOSPHATE IVPB (in mmol)     PRN Meds: acetaminophen, diphenhydrAMINE, traZODone   Vital Signs    Vitals:   09/20/21 1400 09/20/21 1944 09/21/21 0117 09/21/21 0320  BP: (!) 124/48 (!) 112/45 (!) 127/91 (!) 128/49  Pulse: 64 (!) 58 (!) 59 61  Resp: 16 16 16 18   Temp:  97.7 F (36.5 C) 98 F (36.7 C) 98.3 F (36.8 C)  TempSrc:  Oral    SpO2: 97% 100% 98% 97%  Weight:      Height:        Intake/Output Summary (Last 24 hours) at 09/21/2021 0841 Last data filed at 09/21/2021 0356 Gross per 24 hour  Intake 340 ml  Output --  Net 340 ml   Filed Weights   09/17/21 1435 09/19/21 0435 09/20/21 0406  Weight: 82.6 kg 82.5 kg 83.2 kg    Telemetry    Afib with intermittent A-pacing, PVCs, and aberrancy - Personally Reviewed  ECG    No new tracings - Personally Reviewed  Physical Exam   GEN: No acute distress.   Neck: No JVD. Cardiac: RRR, II/VI murmur RUSB, no rubs, or gallops.  Respiratory: Clear to auscultation bilaterally.  GI: Soft, nontender, non-distended.   MS: No edema; No deformity. Neuro:  Alert and oriented x 3; Nonfocal.  Psych: Normal affect.  Labs     Chemistry Recent Labs  Lab 09/15/21 1327 09/16/21 0419 09/17/21 0415 09/18/21 0547 09/19/21 0435 09/20/21 0422 09/20/21 1505 09/21/21 0611  NA 133*   < > 136   < > 138 141  --  139  K 3.3*   < > 3.9   < > 3.6 3.7  --  3.3*  CL 107   < > 115*   < > 112* 111  --  111  CO2 18*   < > 16*   < > 20* 21*  --  22  GLUCOSE 154*   < > 112*   < > 98 86 139* 133*  BUN 34*   < > 29*   < > 23 19  --  15  CREATININE 1.83*   < > 1.12   < > 1.06 0.95  --  1.02  CALCIUM 8.5*   < > 7.9*   < > 8.1* 8.2*  --  8.0*  PROT 6.6  --  5.3*  --   --   --   --   --   ALBUMIN 3.4*  --  2.5*  --   --   --   --   --   AST  141*  --  88*  --   --   --   --   --   ALT 71*  --  55*  --   --   --   --   --   ALKPHOS 101  --  75  --   --   --   --   --   BILITOT 1.2  --  0.8  --   --   --   --   --   GFRNONAA 37*   < > >60   < > >60 >60  --  >60  ANIONGAP 8   < > 5   < > 6 9  --  6   < > = values in this interval not displayed.     Hematology Recent Labs  Lab 09/18/21 0547 09/19/21 0435 09/21/21 0611  WBC 8.2 8.0 10.4  RBC 2.39* 2.51* 2.46*  HGB 7.3* 7.5* 7.4*  HCT 21.8* 23.0* 22.8*  MCV 91.2 91.6 92.7  MCH 30.5 29.9 30.1  MCHC 33.5 32.6 32.5  RDW 16.2* 16.4* 16.1*  PLT 124* 136* 186    Cardiac EnzymesNo results for input(s): TROPONINI in the last 168 hours. No results for input(s): TROPIPOC in the last 168 hours.   BNP Recent Labs  Lab 09/15/21 1327  BNP 630.9*     DDimer No results for input(s): DDIMER in the last 168 hours.   Radiology    US Venous Img Upper Uni Left (DVT)  Result Date: 09/20/2021 IMPRESSION: No evidence of DVT within the left upper extremity. Electronically Signed   By: Jerilynn Mages.  Shick M.D.   On: 09/20/2021 16:04   Cardiac Studies   TTE 09/18/2021 1. Left ventricular ejection fraction, by estimation, is 55 to 60%. The  left ventricle has normal function. The left ventricle has no regional  wall motion abnormalities. Left ventricular diastolic parameters were  normal.    2. Right ventricular systolic function is normal. The right ventricular  size is not well visualized.   3. Right atrial size was mild to moderately dilated.   4. The mitral valve is degenerative. Mild mitral valve regurgitation.   5. The aortic valve was not well visualized. Aortic valve regurgitation  is mild. Aortic valve sclerosis/calcification is present, without any  evidence of aortic stenosis.   6. Aortic dilatation noted. There is mild dilatation of the aortic root,  measuring 41 mm.  __________  TEE 09/20/2021: 1. Left ventricular ejection fraction, by estimation, is 55 to 60%. The  left ventricle has normal function.   2. Right ventricular systolic function is normal. The right ventricular  size is normal.   3. No left atrial/left atrial appendage thrombus was detected.   4. The mitral valve is normal in structure. Mild to moderate mitral valve  regurgitation.   5. The aortic valve is tricuspid. Aortic valve regurgitation is mild to  moderate. Aortic valve sclerosis is present, with no evidence of aortic  valve stenosis.   6. Aortic dilatation noted. There is moderate dilatation of the aortic  root, measuring 46 mm. There is mild dilatation of the ascending aorta,  measuring 44 mm.   Conclusion(s)/Recommendation(s): No evidence of vegetation/infective  endocarditis on this transesophageael echocardiogram.   Patient Profile     81 y.o. male with history of CAD/CABG, ischemic cardiomyopathy EF 35%, paroxysmal atrial fibrillation, AICD, GI bleed, presenting with weakness and fall, found to have right upper extremity cellulitis, MSSA bacteremia and A-fib RVR.  Being seen  for A-fib RVR.  Assessment & Plan    1. A-fib RVR -Rate controlled with A pacing -Continue Toprol-XL 100 mg daily -Continue Eliquis, hemoglobin stable    2.  MSSA bacteremia -TEE without vegetation noted as outlined above -Given ICD, he will require at least 4 weeks of antibiotics -If he has  recurrent MSSA bacteremia, the device may need to be explanted   -ABX per IM/ID   3.  Ischemic cardiomyopathy -Prior EF 35%, echo this admission with normalization of LVSF with an EF of 55-60% -Status post ICD -Toprol-XL 100 mg daily -Did not tolerate ACE/ARB/Arni due to hypotension. -Escalate GDMT as BP permits per primary cardiology team   4.  CAD s/p CABG -Denies chest pain -Eliquis, Lipitor, Toprol   5.  Anemia, history of GI bleed -Stable -History of GI bleed -Monitor H&H, hold Eliquis if H&H trends downward -Consider GI input as per primary team.  He plans to follow up with his [facility] team      For questions or updates, please contact [facility] Please consult [email or web address] for contact info under Cardiology/STEMI.    Signed, Christell Faith, PA-C [place] Pager: [phone] 09/21/2021, 8:41 AM

## 2021-09-21 NOTE — Progress Notes (Signed)
Occupational Therapy Treatment Patient Details Name: Duane Owens MRN: [id number] DOB: [birth year] Today's Date: 09/21/2021   History of present illness Pt is an 81 y/o M admitted on 09/15/21 after presenting with c/o fall & weakness. Pt is being treated for septic shock 2/2 HCAP at the L lower lobe, RUE mild cellulitis with superficial thrombosis, AKI 2/2 shock. Pt was recnetly hospitalized at [facility] on 2/16 x 4 days for dizziness & syncope. PMH: a-fib on eliquis, HTN, HLD, GERD, gout, depression, sCHF, prostate CA s/p radiation therapy, anemia, chronic pain, thrombocytopenia, CAD, pacemaker, CKD-3   OT comments  Pt seen for brief OT tx. Pt reports having just gone to the bathroom himself, standing at sink counter upon OT's arrival. Denies difficulty and SOB. Pt educated in activity pacing for ADL and IADL and falls prevention upon return home, introduced concept of using pulse ox to help monitor O2/HR. RN in room at end of session.    Recommendations for follow up therapy are one component of a multi-disciplinary discharge planning process, led by the attending physician.  Recommendations may be updated based on patient status, additional functional criteria and insurance authorization.    Follow Up Recommendations  Home health OT    Assistance Recommended at Discharge Intermittent Supervision/Assistance  Patient can return home with the following  A little help with walking and/or transfers;A little help with bathing/dressing/bathroom;Assistance with cooking/housework;Assist for transportation;Help with stairs or ramp for entrance   Equipment Recommendations  None recommended by OT    Recommendations for Other Services      Precautions / Restrictions Precautions Precautions: Fall Restrictions Weight Bearing Restrictions: No       Mobility Bed Mobility Overal bed mobility: Modified Independent                  Transfers Overall transfer level: Needs  assistance Equipment used: None Transfers: Sit to/from Stand             General transfer comment: pt standing in room prior to OT's arrival, reports having just gone to the bathroom on his own, supv for standing/transfers in room     Balance Overall balance assessment: Needs assistance Sitting-balance support: No upper extremity supported, Feet supported, Single extremity supported Sitting balance-Leahy Scale: Good     Standing balance support: Single extremity supported, No upper extremity supported, During functional activity Standing balance-Leahy Scale: Fair                             ADL either performed or assessed with clinical judgement   ADL Overall ADL's : Needs assistance/impaired                                       General ADL Comments: near baseline, mod indep with toileting, LB dressing from seated, STS positions    Extremity/Trunk Assessment              Vision       Perception     Praxis      Cognition Arousal/Alertness: Awake/alert Behavior During Therapy: WFL for tasks assessed/performed Overall Cognitive Status: Within Functional Limits for tasks assessed  Exercises Other Exercises Other Exercises: Pt educated in activity pacing and falls prevention upon return home, introduced concept of using pulse ox to help monitor O2/HR    Shoulder Instructions       General Comments      Pertinent Vitals/ Pain       Pain Assessment Pain Assessment: No/denies pain  Home Living                                          Prior Functioning/Environment              Frequency  Min 2X/week        Progress Toward Goals  OT Goals(current goals can now be found in the care plan section)  Progress towards OT goals: Progressing toward goals  Acute Rehab OT Goals Patient Stated Goal: to go home OT Goal Formulation: With  patient Time For Goal Achievement: 10/01/21 Potential to Achieve Goals: Good  Plan Discharge plan remains appropriate;Frequency remains appropriate    Co-evaluation                 AM-PAC OT "6 Clicks" Daily Activity     Outcome Measure   Help from another person eating meals?: None Help from another person taking care of personal grooming?: None Help from another person toileting, which includes using toliet, bedpan, or urinal?: None Help from another person bathing (including washing, rinsing, drying)?: A Little Help from another person to put on and taking off regular upper body clothing?: None Help from another person to put on and taking off regular lower body clothing?: A Little 6 Click Score: 22    End of Session    OT Visit Diagnosis: Unsteadiness on feet (R26.81);Muscle weakness (generalized) (M62.81)   Activity Tolerance Patient tolerated treatment well   Patient Left in bed;with call bell/phone within reach;with bed alarm set;with nursing/sitter in room   Nurse Communication Mobility status        Time: 4196-2229 OT Time Calculation (min): 9 min  Charges: OT General Charges $OT Visit: 1 Visit OT Treatments $Self Care/Home Management : 8-22 mins  Ardeth Perfect., MPH, MS, OTR/L ascom [phone] 09/21/21, 1:23 PM

## 2021-09-21 NOTE — Discharge Summary (Signed)
Physician Discharge Summary   Patient: Duane Owens MRN: [id number] DOB: 11-12-1940  Admit date:     09/15/2021  Discharge date: 09/21/21  Discharge Physician: Sharen Hones   PCP: [name], MD   Recommendations at discharge:   Follow-up with PCP and infect disease as scheduled. Check a CBC, BMP, magnesium and phosphorus in 1 week in PCP office  Discharge Diagnoses: Active Problems:   Thrombocytopenia (HCC)   HTN (hypertension)   HLD (hyperlipidemia)   Chronic systolic CHF (congestive heart failure) (HCC)   Normocytic anemia   CAD (coronary artery disease)   Fall   Elevated troponin   Abnormal LFTs   Acute renal failure superimposed on stage 3a chronic kidney disease (HCC)   NSVT (nonsustained ventricular tachycardia)   Hypokalemia   Septic shock (HCC)   Left lower lobe pneumonia   Right arm cellulitis   Paroxysmal atrial fibrillation with RVR (HCC)   Metabolic acidosis   MSSA (methicillin susceptible Staphylococcus aureus) septicemia (HCC)  Resolved Problems:   * No resolved hospital problems. *   Hospital Course: Duane Owens is a 81 y.o. male with medical history significant of a fib on Eliquis, hypertension, hyperlipidemia, GERD, gout, depression, varicose vein, sCHF with EF of 35%, prostate cancer (s/p of radiation therapy), anemia, chronic pain, thrombocytopenia, CAD, pacemaker placement, CKD-3, who present fall and weakness.  Was recently hospitalized at [facility] on 2/16 for 4 days for dizziness and syncope. Upon arriving the hospital, she had temperature of 100.5, leukocytosis of 1822, elevated PT/INR, elevated troponin 143, elevated AST ALT, creatinine 1.83. He developed hypotension, did not respond to IV fluids.  He was transferred to ICU and placed on pressor. Blood culture came back with MSSA.  Patient bacteremia most likely due to right arm cellulitis/superficial phlebitis.  Assessment and Plan: Septic shock secondary to MSSA septicemia secondary  to cellulitis Right arm cellulitis with superficial phlebitis. Pneumonia ruled out. Appreciate consult from ID, patient was treated with cefazolin per ID consult. Changed to Zyvox on 2/25 as patient lost IV access. Repeat culture came back no growth, TEE did not show any vegetation.  After discussion with ID, decision was made to treat with cefazolin infusion for total course of 4 weeks.  Patient will be followed by ID as outpatient.   Acute kidney injury secondary to shock. Elevated liver enzymes secondary to septic shock. Metabolic acidosis secondary to acute kidney injury. Hypophosphatemia. Condition has improved.  Renal function has normalized.  Patient still has some hypophosphatemia and hypokalemia.  We will replete again today via IV, patient be also taking oral Neutra-Phos for 7 days, need to recheck BMP, mag, Phos in 1 week PCP office.   Elevated troponin secondary to septic shock Coronary artery disease. Paroxysmal atrial fibrillation with rapid ventricle response. Acute on chronic systolic congestive heart failure. Patient has been followed by cardiology, condition has improved.  Patient can be followed by cardiology as scheduled as outpatient.   Severe anemia secondary to anemia of chronic disease Thrombocytopenia. Platelet count much improved.  Iron level adequate, B12 level borderline, homocystine level normal.  Patient was treated with subcu and oral B12, will discontinue at this point. Patient need to repeat a CBC in 1 week.  Transfuse when hemoglobin less than 7.         Consultants: Infectious disease, cardiology. 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  Would consider alternative agents if possible.        Medication List     STOP taking these medications    Entresto 97-103 MG Generic drug: sacubitril-valsartan   furosemide 20 MG tablet Commonly known as: LASIX   hydrochlorothiazide 12.5 MG tablet Commonly known as: HYDRODIURIL   valsartan 80 MG tablet Commonly known as: DIOVAN       TAKE these medications    allopurinol 300 MG tablet Commonly known as: ZYLOPRIM Take 300 mg by mouth daily.   amLODipine 10 MG tablet Commonly known as: NORVASC Take 10 mg by mouth daily.   apixaban 5 MG Tabs tablet Commonly known as: ELIQUIS Take 5 mg by mouth 2 (two) times daily.   atorvastatin 20 MG tablet Commonly known as: LIPITOR Take 20 mg by mouth daily.   ceFAZolin  IVPB Commonly known as: ANCEF Inject 6 g into the vein continuous for 24 days. Infuse cefazolin 6gm daily IV as continuous infusion over 24hs  Indication:  MSSA bacteremia First Dose: Yes Last Day of Therapy: 10/15/2021 Labs - Once weekly:  CBC/D and BMP Please pull PIC at completion of IV antibiotics Fax weekly labs to [phone] Method of administration: elastomeric Method of administration may be changed at the discretion of home infusion pharmacist based upon assessment of the patient and/or caregiver's ability to self-administer the medication ordered.   metoprolol succinate 50 MG 24 hr tablet Commonly known as: TOPROL-XL Take 50 mg by mouth daily. What changed: Another medication with the same name was removed. Continue taking this medication, and follow the directions you see here.   mirtazapine 15 MG tablet Commonly known as: REMERON Mirtazapine 15 MG Oral Tablet QTY: 90 each Days: 90 Refills: 0  Written: 06/05/20 Patient Instructions: TAKE 1 TABLET BY MOUTH AT BEDTIME   Mitigare 0.6 MG Caps Generic drug: Colchicine Take 0.3 mg by mouth daily.   nitroGLYCERIN  0.4 MG SL tablet Commonly known as: NITROSTAT Place under the tongue.   pantoprazole 40 MG tablet Commonly known as: PROTONIX Take 40 mg by mouth daily.   Phos-NaK 280-160-250 MG Pack Generic drug: potassium & sodium phosphates Take 1 packet by mouth 4 (four) times daily -  with meals and at bedtime for 5 days.   sodium bicarbonate 650 MG tablet Take 1 tablet (650 mg total) by mouth 2 (two) times daily for 7 days.   traZODone 50 MG tablet Commonly known as: DESYREL Take by mouth.               Discharge Care Instructions  (From admission, onward)           Start     Ordered   09/21/21 0000  Change dressing on IV access line weekly and PRN  (Home infusion instructions - [facility] )        09/21/21 1249   09/21/21 0000  Discharge wound care:       Comments: Follow with visiting RN   09/21/21 1249            Follow-up Information     Mignon Pine, DO Follow up on 09/29/2021.  Specialties: Infectious Diseases, Internal Medicine Why: appointment at 9:45am Contact information: [place] [address] [phone]         [name], MD Follow up in 1 week(s).   Specialty: Internal Medicine Contact information: [place] [address] [phone]                 Discharge Exam: Filed Weights   09/17/21 1435 09/19/21 0435 09/20/21 0406  Weight: 82.6 kg 82.5 kg 83.2 kg   General exam: Appears calm and comfortable  Respiratory system: Clear to auscultation. Respiratory effort normal. Cardiovascular system: S1 & S2 heard, RRR. No JVD, murmurs, rubs, gallops or clicks. No pedal edema. Gastrointestinal system: Abdomen is nondistended, soft and nontender. No organomegaly or masses felt. Normal bowel sounds heard. Central nervous system: Alert and oriented. No focal neurological deficits. Extremities: Symmetric 5 x 5 power. Skin: No rashes, lesions or ulcers Psychiatry: Judgement  and insight appear normal. Mood & affect appropriate.    Condition at discharge: good  The results of significant diagnostics from this hospitalization (including imaging, microbiology, ancillary and laboratory) are listed below for reference.   Imaging Studies: DG Chest 2 View  Result Date: 09/17/2021 CLINICAL DATA:  Sepsis EXAM: CHEST - 2 VIEW COMPARISON:  Radiograph 09/15/2021 FINDINGS: Unchanged cardiomediastinal silhouette with pacemaker/AICD leads. Prior median sternotomy and CABG. Elevated left hemidiaphragm, unchanged. There are mild interstitial opacities. Faint right lower lung opacity and left medial basilar opacities, increased from prior. No large pleural effusion. No visible pneumothorax. No acute osseous abnormality. IMPRESSION: Increased left medial basilar opacities and faint right peripheral lower lung opacity, which could represent atelectasis or developing infection. Electronically Signed   By: Maurine Simmering M.D.   On: 09/17/2021 08:40   CT HEAD WO CONTRAST (5MM)  Result Date: 09/15/2021 CLINICAL DATA:  Trauma EXAM: CT HEAD WITHOUT CONTRAST TECHNIQUE: Contiguous axial images were obtained from the base of the skull through the vertex without intravenous contrast. RADIATION DOSE REDUCTION: This exam was performed according to the departmental dose-optimization program which includes automated exposure control, adjustment of the mA and/or kV according to patient size and/or use of iterative reconstruction technique. COMPARISON:  None. FINDINGS: Brain: No acute intracranial findings are seen in noncontrast CT brain. Cortical sulci are prominent. Ventricles are not dilated. There is no focal mass effect. Vascular: Unremarkable Skull: Unremarkable. Sinuses/Orbits: There is mild mucosal thickening in the ethmoid and sphenoid sinuses. Other: None IMPRESSION: No acute intracranial findings are seen in noncontrast CT brain. Atrophy. Electronically Signed   By: Elmer Picker M.D.   On:  09/15/2021 20:03   CT HUMERUS RIGHT WO CONTRAST  Result Date: 09/16/2021 CLINICAL DATA:  Foreign body suspected, upper arm, negative x-ray. EXAM: CT OF THE RIGHT HUMERUS WITHOUT CONTRAST TECHNIQUE: Multidetector CT imaging was performed according to the standard protocol. Multiplanar CT image reconstructions were also generated. RADIATION DOSE REDUCTION: This exam was performed according to the departmental dose-optimization program which includes automated exposure control, adjustment of the mA and/or kV according to patient size and/or use of iterative reconstruction technique. COMPARISON:  None. FINDINGS: Bones/Joint/Cartilage No fracture or dislocation. Normal alignment. No joint effusion. Moderate acromioclavicular osteoarthritis. Ligaments Ligaments are suboptimally evaluated by CT. Muscles and Tendons Muscles are normal in bulk and density. No intramuscular hematoma or fluid collection. No radiopaque foreign body. Soft tissue No fluid collection or hematoma.  No soft tissue mass. IMPRESSION: 1.  No acute osseous abnormality. 2. Muscles and subcutaneous soft tissues are  within normal limits. No evidence of fluid collection or abscess. No radiopaque foreign body. Electronically Signed   By: Keane Police D.O.   On: 09/16/2021 13:25   CT FOREARM RIGHT WO CONTRAST  Result Date: 09/16/2021 CLINICAL DATA:  Foreign body suspected, forearm, negative x-ray. Cellulitis of the right arm. EXAM: CT OF THE RIGHT FOREARM WITHOUT CONTRAST TECHNIQUE: Multidetector CT imaging was performed according to the standard protocol. Multiplanar CT image reconstructions were also generated. RADIATION DOSE REDUCTION: This exam was performed according to the departmental dose-optimization program which includes automated exposure control, adjustment of the mA and/or kV according to patient size and/or use of iterative reconstruction technique. COMPARISON:  None. FINDINGS: Bones/Joint/Cartilage No fracture or dislocation. Normal  alignment. No joint effusion. Cystic changes about the distal ulna as well as in the lunate and scaphoid. Ligaments Ligaments are suboptimally evaluated by CT. Muscles and Tendons Muscles are normal in bulk and density. No intramuscular fluid collection or abscess. Tendons of flexor and extensor compartment appear within normal limits. Soft tissue No fluid collection or hematoma.  No soft tissue mass. IMPRESSION: 1. No acute osseous abnormality. Degenerative and cystic changes about the distal ulna and carpal bones. Mild osteoarthritis of the radiocapitellar and ulnar trochlear joints. 2. Muscles and subcutaneous soft tissues are within normal limits. No evidence of fluid collection or abscess. No radiopaque foreign body. Electronically Signed   By: Keane Police D.O.   On: 09/16/2021 13:31   US RENAL  Result Date: 09/15/2021 CLINICAL DATA:  Renal dysfunction EXAM: RENAL / URINARY TRACT ULTRASOUND COMPLETE COMPARISON:  None. FINDINGS: Right Kidney: Renal measurements: 11.1 x 5.5 x 6.9 cm = volume: 219.6 mL. Echogenicity within normal limits. No mass or hydronephrosis visualized. Left Kidney: Renal measurements: 11.2 x 6.1 x 4.8 cm = volume: 171.5 mL. There is no hydronephrosis. There is 1.2 cm cyst in the left renal cortex. Cortical echogenicity is unremarkable. Bladder: Foley catheter is seen in the bladder. Bladder is empty limiting evaluation. Other: None. IMPRESSION: There is no hydronephrosis. Cortical echogenicity in the kidneys is unremarkable. 1.2 cm left renal cyst. Electronically Signed   By: Elmer Picker M.D.   On: 09/15/2021 18:13   US Venous Img Upper Uni Left (DVT)  Result Date: 09/20/2021 CLINICAL DATA:  Left arm swelling EXAM: LEFT UPPER EXTREMITY VENOUS DOPPLER ULTRASOUND TECHNIQUE: Gray-scale sonography with graded compression, as well as color Doppler and duplex ultrasound were performed to evaluate the upper extremity deep venous system from the level of the subclavian vein and  including the jugular, axillary, basilic, radial, ulnar and upper cephalic vein. Spectral Doppler was utilized to evaluate flow at rest and with distal augmentation maneuvers. COMPARISON:  None. FINDINGS: Contralateral Subclavian Vein: Respiratory phasicity is normal and symmetric with the symptomatic side. No evidence of thrombus. Normal compressibility. Internal Jugular Vein: No evidence of thrombus. Normal compressibility, respiratory phasicity and response to augmentation. Subclavian Vein: No evidence of thrombus. Normal compressibility, respiratory phasicity and response to augmentation. Axillary Vein: No evidence of thrombus. Normal compressibility, respiratory phasicity and response to augmentation. Cephalic Vein: No evidence of thrombus. Normal compressibility, respiratory phasicity and response to augmentation. Basilic Vein: No evidence of thrombus. Normal compressibility, respiratory phasicity and response to augmentation. Brachial Veins: No evidence of thrombus. Normal compressibility, respiratory phasicity and response to augmentation. Radial Veins: No evidence of thrombus. Normal compressibility, respiratory phasicity and response to augmentation. Ulnar Veins: No evidence of thrombus. Normal compressibility, respiratory phasicity and response to augmentation. IMPRESSION: No evidence of DVT within the  left upper extremity. Electronically Signed   By: Jerilynn Mages.  Shick M.D.   On: 09/20/2021 16:04   US Venous Img Upper Uni Right(DVT)  Result Date: 09/16/2021 CLINICAL DATA:  Right upper extremity swelling Assess for DVT EXAM: RIGHT UPPER EXTREMITY VENOUS DOPPLER ULTRASOUND TECHNIQUE: Gray-scale sonography with graded compression, as well as color Doppler and duplex ultrasound were performed to evaluate the upper extremity deep venous system from the level of the subclavian vein and including the jugular, axillary, basilic, radial, ulnar and upper cephalic vein. Spectral Doppler was utilized to evaluate flow at  rest and with distal augmentation maneuvers. COMPARISON:  None. FINDINGS: Contralateral Subclavian Vein: Respiratory phasicity is normal and symmetric with the symptomatic side. No evidence of thrombus. Normal compressibility. Internal Jugular Vein: No evidence of thrombus. Normal compressibility, respiratory phasicity and response to augmentation. Subclavian Vein: No evidence of thrombus. Normal compressibility, respiratory phasicity and response to augmentation. Axillary Vein: No evidence of thrombus. Normal compressibility, respiratory phasicity and response to augmentation. Cephalic Vein: Occlusive thrombus noted in the cephalic vein in the antecubital fossa. Basilic Vein: No evidence of thrombus. Normal compressibility, respiratory phasicity and response to augmentation. Brachial Veins: No evidence of thrombus. Normal compressibility, respiratory phasicity and response to augmentation. Radial Veins: No evidence of thrombus. Normal compressibility, respiratory phasicity and response to augmentation. Ulnar Veins: No evidence of thrombus. Normal compressibility, respiratory phasicity and response to augmentation. Other Findings:  None visualized. IMPRESSION: 1. Superficial venous thrombosis of the right cephalic vein at the level of the antecubital fossa. 2. No right upper extremity DVT. Electronically Signed   By: Miachel Roux M.D.   On: 09/16/2021 12:35   DG Chest Portable 1 View  Result Date: 09/15/2021 CLINICAL DATA:  Weakness, fall. EXAM: PORTABLE CHEST 1 VIEW COMPARISON:  Chest radiograph dated 05/06/2008. FINDINGS: The heart size is normal. Vascular calcifications are seen in the aortic arch. Mild diffuse bilateral interstitial opacities likely represent pulmonary edema. There is no pleural effusion or pneumothorax. Degenerative changes are seen in the spine. A left subclavian approach cardiac device is noted. Median sternotomy wires are intact and well aligned defibrillator pads overlie the chest.  IMPRESSION: Mild diffuse bilateral interstitial opacities likely represent pulmonary edema. Aortic Atherosclerosis (ICD10-I70.0). Electronically Signed   By: Zerita Boers M.D.   On: 09/15/2021 13:41   ECHOCARDIOGRAM COMPLETE  Result Date: 09/18/2021    ECHOCARDIOGRAM REPORT   Patient Name:   Duane Owens Date of Exam: 09/18/2021 Medical Rec #:  [id number]         Height:       72.0 in Accession #:    [id number]        Weight:       182.1 lb Date of Birth:  10-03-1940         BSA:          2.047 m Patient Age:    7 years          BP:           118/53 mmHg Patient Gender: M                 HR:           65 bpm. Exam Location:  [facility] Procedure: 2D Echo Indications:     Bacteremia R78.81  History:         Patient has no prior history of Echocardiogram examinations.  Sonographer:     Kathlen Brunswick RDCS Referring Phys:  [place] Diagnosing Phys: Aaron Edelman  Agbor-Etang MD IMPRESSIONS  1. Left ventricular ejection fraction, by estimation, is 55 to 60%. The left ventricle has normal function. The left ventricle has no regional wall motion abnormalities. Left ventricular diastolic parameters were normal.  2. Right ventricular systolic function is normal. The right ventricular size is not well visualized.  3. Right atrial size was mild to moderately dilated.  4. The mitral valve is degenerative. Mild mitral valve regurgitation.  5. The aortic valve was not well visualized. Aortic valve regurgitation is mild. Aortic valve sclerosis/calcification is present, without any evidence of aortic stenosis.  6. Aortic dilatation noted. There is mild dilatation of the aortic root, measuring 41 mm. FINDINGS  Left Ventricle: Left ventricular ejection fraction, by estimation, is 55 to 60%. The left ventricle has normal function. The left ventricle has no regional wall motion abnormalities. The left ventricular internal cavity size was normal in size. There is  no left ventricular hypertrophy. Left ventricular  diastolic parameters were normal. Right Ventricle: The right ventricular size is not well visualized. No increase in right ventricular wall thickness. Right ventricular systolic function is normal. Left Atrium: Left atrial size was normal in size. Right Atrium: Right atrial size was mild to moderately dilated. Pericardium: There is no evidence of pericardial effusion. Mitral Valve: The mitral valve is degenerative in appearance. There is mild calcification of the mitral valve leaflet(s). Mild mitral annular calcification. Mild mitral valve regurgitation. Tricuspid Valve: The tricuspid valve is not well visualized. Tricuspid valve regurgitation is not demonstrated. Aortic Valve: The aortic valve was not well visualized. Aortic valve regurgitation is mild. Aortic regurgitation PHT measures 548 msec. Aortic valve sclerosis/calcification is present, without any evidence of aortic stenosis. Aortic valve peak gradient measures 11.6 mmHg. Pulmonic Valve: The pulmonic valve was not well visualized. Pulmonic valve regurgitation is not visualized. Aorta: Aortic dilatation noted. There is mild dilatation of the aortic root, measuring 41 mm. Venous: The inferior vena cava was not well visualized. IAS/Shunts: No atrial level shunt detected by color flow Doppler. Additional Comments: A device lead is visualized.  LEFT VENTRICLE PLAX 2D LVIDd:         6.20 cm      Diastology LVIDs:         4.50 cm      LV e' medial:    7.29 cm/s LV PW:         1.00 cm      LV E/e' medial:  11.9 LV IVS:        1.10 cm      LV e' lateral:   10.80 cm/s LVOT diam:     2.20 cm      LV E/e' lateral: 8.1 LV SV:         94 LV SV Index:   46 LVOT Area:     3.80 cm  LV Volumes (MOD) LV vol d, MOD A2C: 138.0 ml LV vol d, MOD A4C: 176.0 ml LV vol s, MOD A2C: 67.4 ml LV vol s, MOD A4C: 77.3 ml LV SV MOD A2C:     70.6 ml LV SV MOD A4C:     176.0 ml LV SV MOD BP:      85.2 ml RIGHT VENTRICLE RV Basal diam:  4.00 cm RV S prime:     15.00 cm/s TAPSE (M-mode): 2.0  cm LEFT ATRIUM           Index        RIGHT ATRIUM  Index LA diam:      4.30 cm 2.10 cm/m   RA Area:     26.60 cm LA Vol (A2C): 39.6 ml 19.34 ml/m  RA Volume:   101.00 ml 49.33 ml/m LA Vol (A4C): 46.0 ml 22.47 ml/m  AORTIC VALVE AV Area (Vmax): 2.64 cm AV Vmax:        170.00 cm/s AV Peak Grad:   11.6 mmHg LVOT Vmax:      118.00 cm/s LVOT Vmean:     73.000 cm/s LVOT VTI:       0.248 m AI PHT:         548 msec  AORTA Ao Root diam: 4.10 cm MITRAL VALVE MV Area (PHT): 3.00 cm    SHUNTS MV Decel Time: 253 msec    Systemic VTI:  0.25 m MV E velocity: 87.00 cm/s  Systemic Diam: 2.20 cm MV A velocity: 63.40 cm/s MV E/A ratio:  1.37 Kate Sable MD Electronically signed by Kate Sable MD Signature Date/Time: 09/18/2021/3:40:39 PM    Final    ECHO TEE  Result Date: 09/20/2021    TRANSESOPHOGEAL ECHO REPORT   Patient Name:   Duane Owens Date of Exam: 09/20/2021 Medical Rec #:  [id number]         Height:       72.0 in Accession #:    [id number]        Weight:       183.4 lb Date of Birth:  Jun 04, 1941         BSA:          2.054 m Patient Age:    81 years          BP:           128/51 mmHg Patient Gender: M                 HR:           60 bpm. Exam Location:  [facility] Procedure: Transesophageal Echo, Cardiac Doppler and Color Doppler Indications:     Bacteremia R78.81  History:         Patient has prior history of Echocardiogram examinations, most                  recent 09/18/2021. CHF; Risk Factors:Dyslipidemia and                  Hypertension.  Sonographer:     Sherrie Sport Referring Phys:  [id number] Kate Sable Diagnosing Phys: Kate Sable MD PROCEDURE: The transesophogeal probe was passed without difficulty through the esophogus of the patient. Sedation performed by performing physician. The patient developed no complications during the procedure. IMPRESSIONS  1. Left ventricular ejection fraction, by estimation, is 55 to 60%. The left ventricle has normal function.  2. Right  ventricular systolic function is normal. The right ventricular size is normal.  3. No left atrial/left atrial appendage thrombus was detected.  4. The mitral valve is normal in structure. Mild to moderate mitral valve regurgitation.  5. The aortic valve is tricuspid. Aortic valve regurgitation is mild to moderate. Aortic valve sclerosis is present, with no evidence of aortic valve stenosis.  6. Aortic dilatation noted. There is moderate dilatation of the aortic root, measuring 46 mm. There is mild dilatation of the ascending aorta, measuring 44 mm. Conclusion(s)/Recommendation(s): No evidence of vegetation/infective endocarditis on this transesophageael echocardiogram. FINDINGS  Left Ventricle: Left ventricular ejection fraction, by estimation, is 55 to 60%. The left ventricle has  normal function. The left ventricular internal cavity size was normal in size. Right Ventricle: The right ventricular size is normal. No increase in right ventricular wall thickness. Right ventricular systolic function is normal. Left Atrium: Left atrial size was normal in size. No left atrial/left atrial appendage thrombus was detected. Right Atrium: Right atrial size was normal in size. Pericardium: There is no evidence of pericardial effusion. Mitral Valve: The mitral valve is normal in structure. Mild to moderate mitral valve regurgitation. Tricuspid Valve: The tricuspid valve is normal in structure. Tricuspid valve regurgitation is mild. Aortic Valve: The aortic valve is tricuspid. Aortic valve regurgitation is mild to moderate. Aortic valve sclerosis is present, with no evidence of aortic valve stenosis. Pulmonic Valve: The pulmonic valve was normal in structure. Pulmonic valve regurgitation is trivial. Aorta: Aortic dilatation noted. There is moderate dilatation of the aortic root, measuring 46 mm. There is mild dilatation of the ascending aorta, measuring 44 mm. IAS/Shunts: No atrial level shunt detected by color flow Doppler.  Additional Comments: A device lead is visualized.   AORTA Ao Root diam: 4.75 cm Kate Sable MD Electronically signed by Kate Sable MD Signature Date/Time: 09/20/2021/2:54:57 PM    Final    [place] EKG SITE RITE  Result Date: 09/21/2021 If [facility] image not attached, placement could not be confirmed due to current cardiac rhythm.  US ABDOMEN LIMITED RUQ (LIVER/GB)  Result Date: 09/15/2021 CLINICAL DATA:  Abnormal liver function tests EXAM: ULTRASOUND ABDOMEN LIMITED RIGHT UPPER QUADRANT COMPARISON:  None. FINDINGS: Gallbladder: No gallstones or wall thickening visualized. No sonographic Murphy sign noted by sonographer. Common bile duct: Diameter: 3.8 mm Liver: No focal lesion identified. Within normal limits in parenchymal echogenicity. Portal vein is patent on color Doppler imaging with normal direction of blood flow towards the liver. Other: None. IMPRESSION: No sonographic abnormality is seen in the right upper quadrant of abdomen. Electronically Signed   By: Elmer Picker M.D.   On: 09/15/2021 16:05    Microbiology: Results for orders placed or performed during the hospital encounter of 09/15/21  MRSA Next Gen by PCR, Nasal     Status: None   Collection Time: 09/15/21  1:15 AM   Specimen: Nasal Mucosa; Nasal Swab  Result Value Ref Range Status   MRSA by PCR Next Gen NOT DETECTED NOT DETECTED Final    Comment: (NOTE) The GeneXpert MRSA Assay (FDA approved for NASAL specimens only), is one component of a comprehensive MRSA colonization surveillance program. It is not intended to diagnose MRSA infection nor to guide or monitor treatment for MRSA infections. Test performance is not FDA approved in patients less than 25 years old. Performed at [facility], [address] [place], [place] [address]   Resp Panel by RT-PCR (Flu A&B, Covid) Nasopharyngeal Swab     Status: None   Collection Time: 09/15/21  1:28 PM   Specimen: Nasopharyngeal Swab; Nasopharyngeal(NP)  swabs in vial transport medium  Result Value Ref Range Status   SARS Coronavirus 2 by RT PCR NEGATIVE NEGATIVE Final    Comment: (NOTE) SARS-CoV-2 target nucleic acids are NOT DETECTED.  The SARS-CoV-2 RNA is generally detectable in upper respiratory specimens during the acute phase of infection. The lowest concentration of SARS-CoV-2 viral copies this assay can detect is 138 copies/mL. A negative result does not preclude SARS-Cov-2 infection and should not be used as the sole basis for treatment or other patient management decisions. A negative result may occur with  improper specimen collection/handling, submission of specimen  other than nasopharyngeal swab, presence of viral mutation(s) within the areas targeted by this assay, and inadequate number of viral copies(<138 copies/mL). A negative result must be combined with clinical observations, patient history, and epidemiological information. The expected result is Negative.  Fact Sheet for Patients:  EntrepreneurPulse.com.au  Fact Sheet for Healthcare Providers:  IncredibleEmployment.be  This test is no t yet approved or cleared by the [place] FDA and  has been authorized for detection and/or diagnosis of SARS-CoV-2 by FDA under an Emergency Use Authorization (EUA). This EUA will remain  in effect (meaning this test can be used) for the duration of the COVID-19 declaration under Section 564(b)(1) of the Act, 21 U.S.C.section 360bbb-3(b)(1), unless the authorization is terminated  or revoked sooner.       Influenza A by PCR NEGATIVE NEGATIVE Final   Influenza B by PCR NEGATIVE NEGATIVE Final    Comment: (NOTE) The Xpert Xpress SARS-CoV-2/FLU/RSV plus assay is intended as an aid in the diagnosis of influenza from Nasopharyngeal swab specimens and should not be used as a sole basis for treatment. Nasal washings and aspirates are unacceptable for Xpert Xpress  SARS-CoV-2/FLU/RSV testing.  Fact Sheet for Patients: EntrepreneurPulse.com.au  Fact Sheet for Healthcare Providers: IncredibleEmployment.be  This test is not yet approved or cleared by the [place] FDA and has been authorized for detection and/or diagnosis of SARS-CoV-2 by FDA under an Emergency Use Authorization (EUA). This EUA will remain in effect (meaning this test can be used) for the duration of the COVID-19 declaration under Section 564(b)(1) of the Act, 21 U.S.C. section 360bbb-3(b)(1), unless the authorization is terminated or revoked.  Performed at [facility], [address] [place], [place] [address]   Urine Culture     Status: None   Collection Time: 09/15/21  5:05 PM   Specimen: Urine, Random  Result Value Ref Range Status   Specimen Description   Final    URINE, RANDOM Performed at [facility], [address] [place], [place] [address]    Special Requests   Final    NONE Performed at [facility], [address], [place], [place] [address]    Culture   Final    NO GROWTH Performed at [facility], [place] [address] [place], [place] [address]    Report Status 09/17/2021 FINAL  Final  Culture, blood (Routine X 2) w Reflex to ID Panel     Status: Abnormal   Collection Time: 09/15/21 11:54 PM   Specimen: BLOOD  Result Value Ref Range Status   Specimen Description   Final    BLOOD LEFT FOREARM Performed at [facility], [address] [place], [place] [address]    Special Requests   Final    BOTTLES DRAWN AEROBIC AND ANAEROBIC Blood Culture results may not be optimal due to an inadequate volume of blood received in culture bottles Performed at [facility], [address], [place], [place] [address]    Culture  Setup Time   Final    GRAM POSITIVE COCCI IN BOTH AEROBIC AND ANAEROBIC BOTTLES Organism ID to follow CRITICAL RESULT CALLED TO, READ BACK BY AND  VERIFIED WITH: NATHAN BELUE @0028  ON 09/17/21 SKL GRAM STAIN REVIEWED-AGREE WITH RESULT Performed at [facility], [address] [place], [place] [address]    Culture STAPHYLOCOCCUS AUREUS (A)  Final   Report Status 09/19/2021 FINAL  Final   Organism ID, Bacteria STAPHYLOCOCCUS AUREUS  Final      Susceptibility  Staphylococcus aureus - MIC*    CIPROFLOXACIN <=0.5 SENSITIVE Sensitive     ERYTHROMYCIN <=0.25 SENSITIVE Sensitive     GENTAMICIN <=0.5 SENSITIVE Sensitive     OXACILLIN <=0.25 SENSITIVE Sensitive     TETRACYCLINE <=1 SENSITIVE Sensitive     VANCOMYCIN <=0.5 SENSITIVE Sensitive     TRIMETH/SULFA <=10 SENSITIVE Sensitive     CLINDAMYCIN <=0.25 SENSITIVE Sensitive     RIFAMPIN <=0.5 SENSITIVE Sensitive     Inducible Clindamycin NEGATIVE Sensitive     * STAPHYLOCOCCUS AUREUS  Blood Culture ID Panel (Reflexed)     Status: Abnormal   Collection Time: 09/15/21 11:54 PM  Result Value Ref Range Status   Enterococcus faecalis NOT DETECTED NOT DETECTED Final   Enterococcus Faecium NOT DETECTED NOT DETECTED Final   Listeria monocytogenes NOT DETECTED NOT DETECTED Final   Staphylococcus species DETECTED (A) NOT DETECTED Final    Comment: CRITICAL RESULT CALLED TO, READ BACK BY AND VERIFIED WITH: NATHAN BELUE @0028  ON 09/17/21 SKL    Staphylococcus aureus (BCID) DETECTED (A) NOT DETECTED Final    Comment: CRITICAL RESULT CALLED TO, READ BACK BY AND VERIFIED WITH: NATHAN BELUE @0028  ON 09/17/21 SKL    Staphylococcus epidermidis NOT DETECTED NOT DETECTED Final   Staphylococcus lugdunensis NOT DETECTED NOT DETECTED Final   Streptococcus species NOT DETECTED NOT DETECTED Final   Streptococcus agalactiae NOT DETECTED NOT DETECTED Final   Streptococcus pneumoniae NOT DETECTED NOT DETECTED Final   Streptococcus pyogenes NOT DETECTED NOT DETECTED Final   A.calcoaceticus-baumannii NOT DETECTED NOT DETECTED Final   Bacteroides fragilis NOT DETECTED NOT DETECTED Final    Enterobacterales NOT DETECTED NOT DETECTED Final   Enterobacter cloacae complex NOT DETECTED NOT DETECTED Final   Escherichia coli NOT DETECTED NOT DETECTED Final   Klebsiella aerogenes NOT DETECTED NOT DETECTED Final   Klebsiella oxytoca NOT DETECTED NOT DETECTED Final   Klebsiella pneumoniae NOT DETECTED NOT DETECTED Final   Proteus species NOT DETECTED NOT DETECTED Final   Salmonella species NOT DETECTED NOT DETECTED Final   Serratia marcescens NOT DETECTED NOT DETECTED Final   Haemophilus influenzae NOT DETECTED NOT DETECTED Final   Neisseria meningitidis NOT DETECTED NOT DETECTED Final   Pseudomonas aeruginosa NOT DETECTED NOT DETECTED Final   Stenotrophomonas maltophilia NOT DETECTED NOT DETECTED Final   Candida albicans NOT DETECTED NOT DETECTED Final   Candida auris NOT DETECTED NOT DETECTED Final   Candida glabrata NOT DETECTED NOT DETECTED Final   Candida krusei NOT DETECTED NOT DETECTED Final   Candida parapsilosis NOT DETECTED NOT DETECTED Final   Candida tropicalis NOT DETECTED NOT DETECTED Final   Cryptococcus neoformans/gattii NOT DETECTED NOT DETECTED Final   Meth resistant mecA/C and MREJ NOT DETECTED NOT DETECTED Final    Comment: Performed at [facility], [address] [place], [place] [address]  Culture, blood (Routine X 2) w Reflex to ID Panel     Status: None (Preliminary result)   Collection Time: 09/16/21  1:44 AM   Specimen: BLOOD  Result Value Ref Range Status   Specimen Description BLOOD LEFT HAND  Final   Special Requests   Final    BOTTLES DRAWN AEROBIC AND ANAEROBIC Blood Culture adequate volume   Culture   Final    NO GROWTH 4 DAYS Performed at [facility], [address] [place], [place] [address]    Report Status PENDING  Incomplete  CULTURE, BLOOD (ROUTINE X 2) w Reflex to ID Panel     Status: None (Preliminary result)  Collection Time: 09/17/21 11:47 PM   Specimen: BLOOD  Result Value Ref Range Status   Specimen  Description BLOOD BLOOD LEFT WRIST  Final   Special Requests   Final    BOTTLES DRAWN AEROBIC AND ANAEROBIC Blood Culture adequate volume   Culture   Final    NO GROWTH 2 DAYS Performed at [facility], [address] [place], [place] [address]    Report Status PENDING  Incomplete  CULTURE, BLOOD (ROUTINE X 2) w Reflex to ID Panel     Status: None (Preliminary result)   Collection Time: 09/17/21 11:47 PM   Specimen: BLOOD  Result Value Ref Range Status   Specimen Description BLOOD BLOOD LEFT HAND  Final   Special Requests   Final    BOTTLES DRAWN AEROBIC AND ANAEROBIC Blood Culture adequate volume   Culture   Final    NO GROWTH 2 DAYS Performed at [facility], [address] [place], [place] [address]    Report Status PENDING  Incomplete    Labs: CBC: Recent Labs  Lab 09/16/21 0419 09/17/21 0415 09/18/21 0547 09/19/21 0435 09/21/21 0611  WBC 15.5* 10.2 8.2 8.0 10.4  NEUTROABS  --  7.9* 5.6 5.0 6.6  HGB 7.9* 7.6* 7.3* 7.5* 7.4*  HCT 24.5* 22.5* 21.8* 23.0* 22.8*  MCV 93.2 92.2 91.2 91.6 92.7  PLT 104* 118* 124* 136* 902   Basic Metabolic Panel: Recent Labs  Lab 09/17/21 0415 09/18/21 0547 09/19/21 0435 09/20/21 0422 09/20/21 1505 09/21/21 0611  NA 136 138 138 141  --  139  K 3.9 3.7 3.6 3.7  --  3.3*  CL 115* 113* 112* 111  --  111  CO2 16* 16* 20* 21*  --  22  GLUCOSE 112* 109* 98 86 139* 133*  BUN 29* 28* 23 19  --  15  CREATININE 1.12 0.94 1.06 0.95  --  1.02  CALCIUM 7.9* 7.9* 8.1* 8.2*  --  8.0*  MG 2.4 2.1 1.8 1.8  --  1.8  PHOS 2.0* 2.1* 2.4* 2.6  --  1.9*   Liver Function Tests: Recent Labs  Lab 09/15/21 1327 09/17/21 0415  AST 141* 88*  ALT 71* 55*  ALKPHOS 101 75  BILITOT 1.2 0.8  PROT 6.6 5.3*  ALBUMIN 3.4* 2.5*   CBG: Recent Labs  Lab 09/16/21 0129 09/20/21 1458  GLUCAP 113* 145*    Discharge time spent: greater than 30 minutes.  Signed: Sharen Hones, MD [facility] 09/21/2021

## 2021-09-21 NOTE — Progress Notes (Addendum)
IV team could not place a PICC line, patient has right arm phlebitis causing bacteremia.  Left arm has a pacemaker lead. We will consult IR for tunneled central venous catheter placement. Discharge delayed until tomorrow after procedure. Also, patient will need to come back to IR to remove the catheter after antibiotics completed.  Daughter updated.

## 2021-09-21 NOTE — TOC Transition Note (Addendum)
Transition of Care [facility]) - CM/SW Discharge Note   Patient Details  Name: Duane Owens MRN: [id number] Date of Birth: [birth year]  [facility] CM/SW Contact:  Alberteen Sam, LCSW Phone Number: 09/21/2021, 1:01 PM   Clinical Narrative:     Patient to dc home with [facility] once Picc placed.   Patient is set up with home infusion antibiotics, Pam Tamera Punt notified. [facility] consents to HHRN/OT.  At time of discharge patient's grandson Duane Owens to transport, he can be reached at [phone].  No further needs identified at this time.   Final next level of care: [place] Barriers to Discharge: Continued Medical Work up   Patient Goals and CMS Choice Patient states their goals for this hospitalization and ongoing recovery are:: to go home CMS Medicare.gov Compare Post Acute Care list provided to:: Patient Choice offered to / list presented to : Patient  Discharge Placement                    Patient and family notified of of transfer: 09/21/21  Discharge Plan and Services   Discharge Planning Services: CM Consult Post Acute Care Choice: Home Health          DME Arranged: N/A DME Agency: NA                  Social Determinants of Health (SDOH) Interventions     Readmission Risk Interventions No flowsheet data found.

## 2021-09-21 NOTE — Progress Notes (Signed)
[facility]  Date of Admission:  09/15/2021           Reason for visit: Follow up on MSSA bacteremia  Current antibiotics: Cefazolin    ASSESSMENT:    81 y.o. male admitted with:  MSSA bacteremia due to right arm cellulitis and superficial phlebitis.  DVT study done 2/23 notes superficial venous thrombosis of right cephalic vein presumably a septic thrombus.  TEE done 2/27 due to presence of ICD was negative and repeat blood cultures 2/24 are NGTD.  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  Would consider alternative agents if possible.    OPAT Orders Discharge antibiotics to be given via PICC line Discharge antibiotics: Per pharmacy protocol cefazolin  Duration: 4 weeks End Date: 10/15/21  [facility] Care Per Protocol:  Home health RN for IV administration and teaching; PICC line care and labs.    Labs weekly while on IV antibiotics: _xx_ CBC with differential _xx_ BMP __ CMP __ CRP __ ESR __ Vancomycin trough __ CK  _xx_ Please pull PIC at completion of IV antibiotics __ Please leave PIC in place until doctor has seen patient or been notified  Fax weekly labs to [phone]  Clinic Follow Up Appt: 09/29/21 @ 9:45am with Dr Juleen China at [facility] in [place].    Active Problems:   Thrombocytopenia (HCC)   HTN (hypertension)   HLD (hyperlipidemia)   Chronic systolic CHF (congestive heart failure) (HCC)   Normocytic anemia   CAD (coronary artery disease)   Fall   Elevated troponin   Abnormal LFTs   Acute renal failure superimposed on stage 3a chronic kidney disease (HCC)   NSVT (nonsustained ventricular  tachycardia)   Hypokalemia   Septic shock (HCC)   Left lower lobe pneumonia   Right arm cellulitis   Paroxysmal atrial fibrillation with RVR (HCC)   Metabolic acidosis   MSSA (methicillin susceptible Staphylococcus aureus) septicemia (HCC)    MEDICATIONS:    Scheduled Meds:  allopurinol  300 mg Oral Daily   apixaban  5 mg Oral BID   atorvastatin  20 mg Oral Daily   Chlorhexidine Gluconate Cloth  6 each Topical Q0600   metoprolol succinate  100 mg Oral Daily   nicotine  21 mg Transdermal Daily   pantoprazole  40 mg Oral Daily   potassium chloride  40 mEq Oral Q2H   predniSONE  10 mg Oral Q breakfast   sodium bicarbonate  650 mg Oral BID   vitamin B-12  1,000 mcg Oral Daily   Continuous Infusions:   ceFAZolin (ANCEF) IV 2 g (09/21/21 0513)   potassium PHOSPHATE IVPB (in mmol)     PRN Meds:.acetaminophen, diphenhydrAMINE, traZODone  SUBJECTIVE:   24 hour events:  No acute events Left UE DVT study negative  Awaiting Abx plan to go home.  Awaiting PICC line.  No new complaints.  Review of Systems  All other systems reviewed and are negative.    OBJECTIVE:   Blood pressure (!) 128/49, pulse 61, temperature 98.3 F (36.8 C), resp. rate 18, height 6' (1.829 m), weight 83.2 kg, SpO2 97 %. Body mass index is 24.87 kg/m.  Physical Exam Constitutional:  Comments: Sitting up in bed.  NAD.    HENT:     Head: Normocephalic and atraumatic.  Eyes:     Extraocular Movements: Extraocular movements intact.     Conjunctiva/sclera: Conjunctivae normal.  Pulmonary:     Effort: Pulmonary effort is normal. No respiratory distress.  Musculoskeletal:     Cervical back: Normal range of motion.  Skin:    General: Skin is warm and dry.     Findings: No rash.  Neurological:     General: No focal deficit present.     Mental Status: He is oriented to person, place, and time.  Psychiatric:        Mood and Affect: Mood normal.        Behavior: Behavior normal.     Lab  Results: Lab Results  Component Value Date   WBC 10.4 09/21/2021   HGB 7.4 (L) 09/21/2021   HCT 22.8 (L) 09/21/2021   MCV 92.7 09/21/2021   PLT 186 09/21/2021    Lab Results  Component Value Date   NA 139 09/21/2021   K 3.3 (L) 09/21/2021   CO2 22 09/21/2021   GLUCOSE 133 (H) 09/21/2021   BUN 15 09/21/2021   CREATININE 1.02 09/21/2021   CALCIUM 8.0 (L) 09/21/2021   GFRNONAA >60 09/21/2021   GFRAA >60 08/11/2015    Lab Results  Component Value Date   ALT 55 (H) 09/17/2021   AST 88 (H) 09/17/2021   ALKPHOS 75 09/17/2021   BILITOT 0.8 09/17/2021    No results found for: CRP  No results found for: ESRSEDRATE   I have reviewed the micro and lab results in Epic.  Imaging: US Venous Img Upper Uni Left (DVT)  Result Date: 09/20/2021 CLINICAL DATA:  Left arm swelling EXAM: LEFT UPPER EXTREMITY VENOUS DOPPLER ULTRASOUND TECHNIQUE: Gray-scale sonography with graded compression, as well as color Doppler and duplex ultrasound were performed to evaluate the upper extremity deep venous system from the level of the subclavian vein and including the jugular, axillary, basilic, radial, ulnar and upper cephalic vein. Spectral Doppler was utilized to evaluate flow at rest and with distal augmentation maneuvers. COMPARISON:  None. FINDINGS: Contralateral Subclavian Vein: Respiratory phasicity is normal and symmetric with the symptomatic side. No evidence of thrombus. Normal compressibility. Internal Jugular Vein: No evidence of thrombus. Normal compressibility, respiratory phasicity and response to augmentation. Subclavian Vein: No evidence of thrombus. Normal compressibility, respiratory phasicity and response to augmentation. Axillary Vein: No evidence of thrombus. Normal compressibility, respiratory phasicity and response to augmentation. Cephalic Vein: No evidence of thrombus. Normal compressibility, respiratory phasicity and response to augmentation. Basilic Vein: No evidence of thrombus.  Normal compressibility, respiratory phasicity and response to augmentation. Brachial Veins: No evidence of thrombus. Normal compressibility, respiratory phasicity and response to augmentation. Radial Veins: No evidence of thrombus. Normal compressibility, respiratory phasicity and response to augmentation. Ulnar Veins: No evidence of thrombus. Normal compressibility, respiratory phasicity and response to augmentation. IMPRESSION: No evidence of DVT within the left upper extremity. Electronically Signed   By: Jerilynn Mages.  Shick M.D.   On: 09/20/2021 16:04   ECHO TEE  Result Date: 09/20/2021    TRANSESOPHOGEAL ECHO REPORT   Patient Name:   Duane Owens Date of Exam: 09/20/2021 Medical Rec #:  [id number]         Height:       72.0 in Accession #:    [id number]        Weight:       183.4 lb Date  of Birth:  12-15-1940         BSA:          2.054 m Patient Age:    35 years          BP:           128/51 mmHg Patient Gender: M                 HR:           60 bpm. Exam Location:  [facility] Procedure: Transesophageal Echo, Cardiac Doppler and Color Doppler Indications:     Bacteremia R78.81  History:         Patient has prior history of Echocardiogram examinations, most                  recent 09/18/2021. CHF; Risk Factors:Dyslipidemia and                  Hypertension.  Sonographer:     Sherrie Sport Referring Phys:  [id number] Kate Sable Diagnosing Phys: Kate Sable MD PROCEDURE: The transesophogeal probe was passed without difficulty through the esophogus of the patient. Sedation performed by performing physician. The patient developed no complications during the procedure. IMPRESSIONS  1. Left ventricular ejection fraction, by estimation, is 55 to 60%. The left ventricle has normal function.  2. Right ventricular systolic function is normal. The right ventricular size is normal.  3. No left atrial/left atrial appendage thrombus was detected.  4. The mitral valve is normal in structure. Mild to moderate mitral valve  regurgitation.  5. The aortic valve is tricuspid. Aortic valve regurgitation is mild to moderate. Aortic valve sclerosis is present, with no evidence of aortic valve stenosis.  6. Aortic dilatation noted. There is moderate dilatation of the aortic root, measuring 46 mm. There is mild dilatation of the ascending aorta, measuring 44 mm. Conclusion(s)/Recommendation(s): No evidence of vegetation/infective endocarditis on this transesophageael echocardiogram. FINDINGS  Left Ventricle: Left ventricular ejection fraction, by estimation, is 55 to 60%. The left ventricle has normal function. The left ventricular internal cavity size was normal in size. Right Ventricle: The right ventricular size is normal. No increase in right ventricular wall thickness. Right ventricular systolic function is normal. Left Atrium: Left atrial size was normal in size. No left atrial/left atrial appendage thrombus was detected. Right Atrium: Right atrial size was normal in size. Pericardium: There is no evidence of pericardial effusion. Mitral Valve: The mitral valve is normal in structure. Mild to moderate mitral valve regurgitation. Tricuspid Valve: The tricuspid valve is normal in structure. Tricuspid valve regurgitation is mild. Aortic Valve: The aortic valve is tricuspid. Aortic valve regurgitation is mild to moderate. Aortic valve sclerosis is present, with no evidence of aortic valve stenosis. Pulmonic Valve: The pulmonic valve was normal in structure. Pulmonic valve regurgitation is trivial. Aorta: Aortic dilatation noted. There is moderate dilatation of the aortic root, measuring 46 mm. There is mild dilatation of the ascending aorta, measuring 44 mm. IAS/Shunts: No atrial level shunt detected by color flow Doppler. Additional Comments: A device lead is visualized.   AORTA Ao Root diam: 4.75 cm Kate Sable MD Electronically signed by Kate Sable MD Signature Date/Time: 09/20/2021/2:54:57 PM    Final    [place] EKG SITE  RITE  Result Date: 09/21/2021 If [facility] image not attached, placement could not be confirmed due to current cardiac rhythm.    Imaging independently reviewed in Epic.    [facility]  [facility] [phone] pager 09/21/2021, 9:13 AM  I spent greater than 35 minutes with the patient including greater than 50% of time in face to face counsel of the patient and in coordination of their care.

## 2021-09-21 NOTE — Progress Notes (Signed)
Picc order received and on assessment patient  has infection on the right arm and has a pacer/defibrillator on the left chest therefore we can not place a bedside picc. Spoke with Dr. Roosevelt Locks and made aware and recommendation is to have IR place a tunneled line on the chest.

## 2021-09-22 ENCOUNTER — Encounter (HOSPITAL_COMMUNITY): Payer: Self-pay | Admitting: Interventional Radiology

## 2021-09-22 ENCOUNTER — Inpatient Hospital Stay: Payer: Medicare HMO | Admitting: Radiology

## 2021-09-22 DIAGNOSIS — D638 Anemia in other chronic diseases classified elsewhere: Secondary | ICD-10-CM

## 2021-09-22 DIAGNOSIS — A4101 Sepsis due to Methicillin susceptible Staphylococcus aureus: Secondary | ICD-10-CM | POA: Diagnosis not present

## 2021-09-22 DIAGNOSIS — L03113 Cellulitis of right upper limb: Secondary | ICD-10-CM | POA: Diagnosis not present

## 2021-09-22 HISTORY — PX: IR PERC TUN PERIT CATH WO PORT S&I /IMAG: IMG2327

## 2021-09-22 LAB — CBC WITH DIFFERENTIAL/PLATELET
Abs Immature Granulocytes: 0.3 10*3/uL — ABNORMAL HIGH (ref 0.00–0.07)
Basophils Absolute: 0 10*3/uL (ref 0.0–0.1)
Basophils Relative: 1 %
Eosinophils Absolute: 0.1 10*3/uL (ref 0.0–0.5)
Eosinophils Relative: 1 %
HCT: 22 % — ABNORMAL LOW (ref 39.0–52.0)
Hemoglobin: 7.2 g/dL — ABNORMAL LOW (ref 13.0–17.0)
Immature Granulocytes: 4 %
Lymphocytes Relative: 25 %
Lymphs Abs: 2.1 10*3/uL (ref 0.7–4.0)
MCH: 30.5 pg (ref 26.0–34.0)
MCHC: 32.7 g/dL (ref 30.0–36.0)
MCV: 93.2 fL (ref 80.0–100.0)
Monocytes Absolute: 0.8 10*3/uL (ref 0.1–1.0)
Monocytes Relative: 9 %
Neutro Abs: 5.3 10*3/uL (ref 1.7–7.7)
Neutrophils Relative %: 60 %
Platelets: 196 10*3/uL (ref 150–400)
RBC: 2.36 MIL/uL — ABNORMAL LOW (ref 4.22–5.81)
RDW: 16.1 % — ABNORMAL HIGH (ref 11.5–15.5)
WBC: 8.6 10*3/uL (ref 4.0–10.5)
nRBC: 0 % (ref 0.0–0.2)

## 2021-09-22 LAB — BASIC METABOLIC PANEL
Anion gap: 5 (ref 5–15)
BUN: 14 mg/dL (ref 8–23)
CO2: 23 mmol/L (ref 22–32)
Calcium: 7.9 mg/dL — ABNORMAL LOW (ref 8.9–10.3)
Chloride: 110 mmol/L (ref 98–111)
Creatinine, Ser: 0.87 mg/dL (ref 0.61–1.24)
GFR, Estimated: >60 mL/min (ref >60)
Glucose, Bld: 98 mg/dL (ref 70–99)
Potassium: 3.7 mmol/L (ref 3.5–5.1)
Sodium: 138 mmol/L (ref 135–145)

## 2021-09-22 LAB — PHOSPHORUS: Phosphorus: 3 mg/dL (ref 2.5–4.6)

## 2021-09-22 LAB — MAGNESIUM: Magnesium: 1.7 mg/dL (ref 1.7–2.4)

## 2021-09-22 MED ORDER — LIDOCAINE-EPINEPHRINE 1 %-1:100000 IJ SOLN
INTRAMUSCULAR | Status: AC
  Administered 2021-09-22: 10 mL
  Filled 2021-09-22: qty 1

## 2021-09-22 MED ORDER — FENTANYL CITRATE (PF) 100 MCG/2ML IJ SOLN
INTRAMUSCULAR | Status: AC | PRN
  Administered 2021-09-22 (×2): 25 ug via INTRAVENOUS

## 2021-09-22 MED ORDER — FENTANYL CITRATE (PF) 100 MCG/2ML IJ SOLN
INTRAMUSCULAR | Status: AC
  Filled 2021-09-22: qty 2

## 2021-09-22 MED ORDER — HEPARIN SOD (PORK) LOCK FLUSH 100 UNIT/ML IV SOLN
INTRAVENOUS | Status: AC
  Filled 2021-09-22: qty 5

## 2021-09-22 MED ORDER — SODIUM CHLORIDE 0.9 % IV SOLN
INTRAVENOUS | Status: AC | PRN
Start: 2021-09-22 — End: 2021-09-22
  Administered 2021-09-22: 10 mL/h via INTRAVENOUS

## 2021-09-22 MED ORDER — ONE-A-DAY MENS PO TABS: 1.0000 | ORAL_TABLET | Freq: Every day | ORAL | 0 refills | Status: DC

## 2021-09-22 MED ORDER — MIDAZOLAM HCL 2 MG/2ML IJ SOLN
INTRAMUSCULAR | Status: AC | PRN
Start: 2021-09-22 — End: 2021-09-22
  Administered 2021-09-22 (×2): .5 mg via INTRAVENOUS

## 2021-09-22 MED ORDER — MIDAZOLAM HCL 2 MG/2ML IJ SOLN
INTRAMUSCULAR | Status: AC
  Filled 2021-09-22: qty 2

## 2021-09-22 NOTE — Progress Notes (Signed)
Patient left 2A alert with no distress noted by wheelchair. Discharged home with grandson driving.  ?

## 2021-09-22 NOTE — Procedures (Signed)
Interventional Radiology Procedure Note ? ?Procedure: Placement of a right IJ approach single lumen tunneled PowerLine.  Catheter tip at cavoatrial jxn and ready for use.  ? ?Complications: None ? ?Estimated Blood Loss: None ? ?Recommendations: ?- Routine line care ?- Return to IR of completion of therapy for line removal. ? ? ?Signed, ? ?Criselda Peaches, MD ? ? ?

## 2021-09-22 NOTE — Progress Notes (Signed)
Discharge instructions given to and reviewed with patient. Patient verbalized understanding of all discharge instructions including follow up appointments, new prescriptions and changes to home medication list. Prescription for antibiotic given to patient and instructed him to take to the pharmacy. Patient verbalized understanding. Patient verbalized that his grandson is coming to get him. Antibiotic infusing at this time and will be finished in about 20 minutes.  ?

## 2021-09-22 NOTE — Progress Notes (Signed)
PHARMACY CONSULT NOTE FOR: ? ?OUTPATIENT  PARENTERAL ANTIBIOTIC THERAPY (OPAT) ? ?Indication: MSSA bacteremia ?Regimen: Cefazolin 6gm IV daily as continuous infusion over 24h ?End date: 10/15/2021 ? ?IV antibiotic discharge orders are pended. ?To discharging provider:  please sign these orders via discharge navigator,  ?Select New Orders & click on the button choice - Manage This Unsigned Work.  ?  ? ?Thank you for allowing pharmacy to be a part of this patient's care. ? ?Doreene Eland, PharmD, BCPS, BCIDP ?Work Cell: [phone] ?09/22/2021 9:31 AM ? ? ? ?

## 2021-09-22 NOTE — Discharge Summary (Signed)
Physician Discharge Summary   Patient: Duane Owens MRN: [id number] DOB: 09-Jun-1941  Admit date:     09/15/2021  Discharge date: 09/22/21  Discharge Physician: Loletha Grayer   PCP: Duane Marble, MD   Recommendations at discharge:   Follow-up PCP Follow-up infectious disease Once done with IV antibiotics will need tunneled catheter to come out.  This will need to be scheduled with interventional radiology.  Discharge Diagnoses: Active Problems:   Thrombocytopenia (HCC)   HTN (hypertension)   HLD (hyperlipidemia)   Chronic systolic CHF (congestive heart failure) (HCC)   Normocytic anemia   CAD (coronary artery disease)   Fall   Elevated troponin   Abnormal LFTs   Acute renal failure superimposed on stage 3a chronic kidney disease (HCC)   NSVT (nonsustained ventricular tachycardia)   Hypokalemia   Septic shock (HCC)   Left lower lobe pneumonia   Right arm cellulitis   Paroxysmal atrial fibrillation with RVR (HCC)   Metabolic acidosis   MSSA (methicillin susceptible Staphylococcus aureus) septicemia [facility])   Hospital Course: 81 year old man with past medical history of atrial fibrillation on Eliquis, hypertension, hyperlipidemia, GERD, gout, depression and varicose veins, chronic systolic congestive heart failure with EF of 35%, prostate cancer status postradiation therapy, anemia, chronic pain, thrombocytopenia, CAD, pacemaker placement, CKD stage III.  He presented with a fall and weakness.  He was recently hospitalized at [facility] on 216 for 4 days with dizziness and syncope.  The patient did have a fever leukocytosis and elevated PT/INR on presentation.  Also had an elevated troponin and elevated liver function test.  Initially required ICU stay and pressor to maintain blood pressure.  Blood culture was positive for MSSA.  Likely secondary to right arm cellulitis and phlebitis.  Patient was seen by infectious disease and recommended IV Ancef continuous upon disposition  until 10/15/2021.  Assessment and Plan: 1.  Septic shock secondary to MSSA septicemia and cellulitis of the right arm with superficial phlebitis.  Patient was treated with cefazolin here in the hospital and will be switched over to IV cefazolin continue to 6 g until 10/15/2021.  The patient will need to set up appointment to remove tunneled catheter with interventional radiology here at [facility]. 2.  Acute kidney injury secondary to shock.  Creatinine 0.87 upon discharge and was 1.83 on admission 3.  Elevated liver function test likely secondary to shock.  AST 141 and ALT 71 on presentation.  Last check was on 09/17/2021 and ALT came down to 88 and ALT down to 55. 4.  Metabolic acidosis secondary to acute kidney injury 5.  Hypophosphatemia.  Recommend checking labs as outpatient.  Dr. Roosevelt Locks prescribed Neutra-Phos upon discharge. 6.  Elevated troponin secondary to septic shock 7.  Paroxysmal atrial fibrillation.  On Eliquis for anticoagulation and metoprolol for heart rate control. 8.  Acute on chronic systolic congestive heart failure.  Can follow-up with cardiology as outpatient.  With blood pressure on the lower side Entresto was held.  With acute kidney injury ARB and spironolactone held.  Patient euvolemic upon discharge. 9.  Anemia of chronic disease.  Hemoglobin 7.2 upon disposition.  No signs of bleeding.  Follow-up as outpatient. 10.  Thrombocytopenia.  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  Would consider  alternative agents if possible.        Medication List     STOP taking these medications    amLODipine 10 MG tablet Commonly known as: NORVASC   Entresto 97-103 MG Generic drug: sacubitril-valsartan   furosemide 20 MG tablet Commonly known as: LASIX   hydrochlorothiazide 12.5 MG tablet Commonly known as: HYDRODIURIL   valsartan 80 MG tablet Commonly known as: DIOVAN       TAKE these medications    allopurinol 300 MG tablet Commonly known as: ZYLOPRIM Take 300 mg by mouth daily.   apixaban 5 MG Tabs tablet Commonly known as: ELIQUIS Take 5 mg by mouth 2 (two) times daily.   atorvastatin 20 MG tablet Commonly known as: LIPITOR Take 20 mg by mouth daily.   ceFAZolin  IVPB Commonly known as: ANCEF Inject 6 g into the vein continuous for 24 days. Infuse cefazolin 6gm daily IV as continuous infusion over 24hs  Indication:  MSSA bacteremia First Dose: Yes Last Day of Therapy: 10/15/2021 Labs - Once weekly:  CBC/D and BMP Please pull PIC at completion of IV antibiotics Fax weekly labs to [phone] Method of administration: elastomeric Method of administration may be changed at the discretion of home infusion pharmacist based upon assessment of the patient and/or caregiver's ability to self-administer the medication ordered.   metoprolol succinate 50 MG 24 hr tablet Commonly known as: TOPROL-XL Take 50 mg by mouth daily. What changed: Another medication with the same name was removed. Continue taking this medication, and follow the directions you see here.   mirtazapine 15 MG tablet Commonly known as: REMERON Mirtazapine 15 MG Oral Tablet QTY: 90 each Days: 90 Refills: 0  Written: 06/05/20 Patient Instructions: TAKE 1 TABLET BY MOUTH AT BEDTIME   Mitigare 0.6 MG Caps Generic drug: Colchicine Take 0.3 mg by mouth daily.   multivitamin Tabs tablet Take 1 tablet by mouth daily.   nitroGLYCERIN 0.4 MG SL tablet Commonly known as: NITROSTAT Place under  the tongue.   pantoprazole 40 MG tablet Commonly known as: PROTONIX Take 40 mg by mouth daily.   Phos-NaK 280-160-250 MG Pack Generic drug: potassium & sodium phosphates Take 1 packet by mouth 4 (four) times daily -  with meals and at bedtime for 5 days.   sodium bicarbonate 650 MG tablet Take 1 tablet (650 mg total) by mouth 2 (two) times daily for 7 days.   traZODone 50 MG tablet Commonly known as: DESYREL Take by mouth.               Discharge Care Instructions  (From admission, onward)           Start     Ordered   09/21/21 0000  Change dressing on IV access line weekly and PRN  (Home infusion instructions - [facility] )        09/21/21 1249   09/21/21 0000  Discharge wound care:       Comments: Follow with visiting RN   09/21/21 1249            Follow-up Information     Mignon Pine, DO Follow up on 09/29/2021.  Specialties: Infectious Diseases, Internal Medicine Why: appointment at 9:45am Contact information: [place] [address] [phone]         Duane Marble, MD Follow up on 09/28/2021.   Specialty: Internal Medicine Why: @ 1:30pm Contact information: [place] [address] [phone]         [facility] Follow up.   Why: will need appointment on 10/15/21 in afternoon to take out tunneled catherter with interventional radiology Contact information: [place]                Discharge Exam: Danley Danker Weights   09/19/21 0435 09/20/21 0406 09/22/21 0404  Weight: 82.5 kg 83.2 kg 84.2 kg   Physical Exam HENT:     Head: Normocephalic.     Mouth/Throat:     Pharynx: No oropharyngeal exudate.  Eyes:     General: Lids are normal.     Conjunctiva/sclera: Conjunctivae normal.  Cardiovascular:     Rate and Rhythm: Normal rate and regular rhythm.     Heart sounds: Normal heart sounds, S1 normal and S2 normal.   Pulmonary:     Breath sounds: No decreased breath sounds, wheezing, rhonchi or rales.  Abdominal:     Palpations: Abdomen is soft.     Tenderness: There is no abdominal tenderness.  Musculoskeletal:     Right lower leg: No swelling.     Left lower leg: No swelling.  Skin:    General: Skin is warm.     Findings: No rash.  Neurological:     Mental Status: He is alert and oriented to person, place, and time.     Condition at discharge: stable  The results of significant diagnostics from this hospitalization (including imaging, microbiology, ancillary and laboratory) are listed below for reference.   Imaging Studies: DG Chest 2 View  Result Date: 09/17/2021 CLINICAL DATA:  Sepsis EXAM: CHEST - 2 VIEW COMPARISON:  Radiograph 09/15/2021 FINDINGS: Unchanged cardiomediastinal silhouette with pacemaker/AICD leads. Prior median sternotomy and CABG. Elevated left hemidiaphragm, unchanged. There are mild interstitial opacities. Faint right lower lung opacity and left medial basilar opacities, increased from prior. No large pleural effusion. No visible pneumothorax. No acute osseous abnormality. IMPRESSION: Increased left medial basilar opacities and faint right peripheral lower lung opacity, which could represent atelectasis or developing infection. Electronically Signed   By: Maurine Simmering M.D.   On: 09/17/2021 08:40   CT HEAD WO CONTRAST (5MM)  Result Date: 09/15/2021 CLINICAL DATA:  Trauma EXAM: CT HEAD WITHOUT CONTRAST TECHNIQUE: Contiguous axial images were obtained from the base of the skull through the vertex without intravenous contrast. RADIATION DOSE REDUCTION: This exam was performed according to the departmental dose-optimization program which includes automated exposure control, adjustment of the mA and/or kV according to patient size and/or use of iterative reconstruction technique. COMPARISON:  None. FINDINGS: Brain: No acute intracranial findings are seen in noncontrast CT brain.  Cortical sulci are prominent. Ventricles are not dilated. There is no focal mass effect. Vascular: Unremarkable Skull: Unremarkable. Sinuses/Orbits: There is mild mucosal thickening in the ethmoid and sphenoid sinuses. Other: None IMPRESSION: No acute intracranial findings are seen in noncontrast CT brain. Atrophy. Electronically Signed   By: Elmer Picker M.D.   On: 09/15/2021 20:03   CT HUMERUS RIGHT WO CONTRAST  Result Date: 09/16/2021 CLINICAL DATA:  Foreign body suspected, upper arm, negative x-ray. EXAM: CT OF THE RIGHT HUMERUS WITHOUT CONTRAST TECHNIQUE: Multidetector CT imaging  was performed according to the standard protocol. Multiplanar CT image reconstructions were also generated. RADIATION DOSE REDUCTION: This exam was performed according to the departmental dose-optimization program which includes automated exposure control, adjustment of the mA and/or kV according to patient size and/or use of iterative reconstruction technique. COMPARISON:  None. FINDINGS: Bones/Joint/Cartilage No fracture or dislocation. Normal alignment. No joint effusion. Moderate acromioclavicular osteoarthritis. Ligaments Ligaments are suboptimally evaluated by CT. Muscles and Tendons Muscles are normal in bulk and density. No intramuscular hematoma or fluid collection. No radiopaque foreign body. Soft tissue No fluid collection or hematoma.  No soft tissue mass. IMPRESSION: 1.  No acute osseous abnormality. 2. Muscles and subcutaneous soft tissues are within normal limits. No evidence of fluid collection or abscess. No radiopaque foreign body. Electronically Signed   By: Keane Police D.O.   On: 09/16/2021 13:25   CT FOREARM RIGHT WO CONTRAST  Result Date: 09/16/2021 CLINICAL DATA:  Foreign body suspected, forearm, negative x-ray. Cellulitis of the right arm. EXAM: CT OF THE RIGHT FOREARM WITHOUT CONTRAST TECHNIQUE: Multidetector CT imaging was performed according to the standard protocol. Multiplanar CT image  reconstructions were also generated. RADIATION DOSE REDUCTION: This exam was performed according to the departmental dose-optimization program which includes automated exposure control, adjustment of the mA and/or kV according to patient size and/or use of iterative reconstruction technique. COMPARISON:  None. FINDINGS: Bones/Joint/Cartilage No fracture or dislocation. Normal alignment. No joint effusion. Cystic changes about the distal ulna as well as in the lunate and scaphoid. Ligaments Ligaments are suboptimally evaluated by CT. Muscles and Tendons Muscles are normal in bulk and density. No intramuscular fluid collection or abscess. Tendons of flexor and extensor compartment appear within normal limits. Soft tissue No fluid collection or hematoma.  No soft tissue mass. IMPRESSION: 1. No acute osseous abnormality. Degenerative and cystic changes about the distal ulna and carpal bones. Mild osteoarthritis of the radiocapitellar and ulnar trochlear joints. 2. Muscles and subcutaneous soft tissues are within normal limits. No evidence of fluid collection or abscess. No radiopaque foreign body. Electronically Signed   By: Keane Police D.O.   On: 09/16/2021 13:31   US RENAL  Result Date: 09/15/2021 CLINICAL DATA:  Renal dysfunction EXAM: RENAL / URINARY TRACT ULTRASOUND COMPLETE COMPARISON:  None. FINDINGS: Right Kidney: Renal measurements: 11.1 x 5.5 x 6.9 cm = volume: 219.6 mL. Echogenicity within normal limits. No mass or hydronephrosis visualized. Left Kidney: Renal measurements: 11.2 x 6.1 x 4.8 cm = volume: 171.5 mL. There is no hydronephrosis. There is 1.2 cm cyst in the left renal cortex. Cortical echogenicity is unremarkable. Bladder: Foley catheter is seen in the bladder. Bladder is empty limiting evaluation. Other: None. IMPRESSION: There is no hydronephrosis. Cortical echogenicity in the kidneys is unremarkable. 1.2 cm left renal cyst. Electronically Signed   By: Elmer Picker M.D.   On:  09/15/2021 18:13   US Venous Img Upper Uni Left (DVT)  Result Date: 09/20/2021 CLINICAL DATA:  Left arm swelling EXAM: LEFT UPPER EXTREMITY VENOUS DOPPLER ULTRASOUND TECHNIQUE: Gray-scale sonography with graded compression, as well as color Doppler and duplex ultrasound were performed to evaluate the upper extremity deep venous system from the level of the subclavian vein and including the jugular, axillary, basilic, radial, ulnar and upper cephalic vein. Spectral Doppler was utilized to evaluate flow at rest and with distal augmentation maneuvers. COMPARISON:  None. FINDINGS: Contralateral Subclavian Vein: Respiratory phasicity is normal and symmetric with the symptomatic side. No evidence of thrombus. Normal compressibility. Internal Jugular  Vein: No evidence of thrombus. Normal compressibility, respiratory phasicity and response to augmentation. Subclavian Vein: No evidence of thrombus. Normal compressibility, respiratory phasicity and response to augmentation. Axillary Vein: No evidence of thrombus. Normal compressibility, respiratory phasicity and response to augmentation. Cephalic Vein: No evidence of thrombus. Normal compressibility, respiratory phasicity and response to augmentation. Basilic Vein: No evidence of thrombus. Normal compressibility, respiratory phasicity and response to augmentation. Brachial Veins: No evidence of thrombus. Normal compressibility, respiratory phasicity and response to augmentation. Radial Veins: No evidence of thrombus. Normal compressibility, respiratory phasicity and response to augmentation. Ulnar Veins: No evidence of thrombus. Normal compressibility, respiratory phasicity and response to augmentation. IMPRESSION: No evidence of DVT within the left upper extremity. Electronically Signed   By: Jerilynn Mages.  Shick M.D.   On: 09/20/2021 16:04   US Venous Img Upper Uni Right(DVT)  Result Date: 09/16/2021 CLINICAL DATA:  Right upper extremity swelling Assess for DVT EXAM: RIGHT  UPPER EXTREMITY VENOUS DOPPLER ULTRASOUND TECHNIQUE: Gray-scale sonography with graded compression, as well as color Doppler and duplex ultrasound were performed to evaluate the upper extremity deep venous system from the level of the subclavian vein and including the jugular, axillary, basilic, radial, ulnar and upper cephalic vein. Spectral Doppler was utilized to evaluate flow at rest and with distal augmentation maneuvers. COMPARISON:  None. FINDINGS: Contralateral Subclavian Vein: Respiratory phasicity is normal and symmetric with the symptomatic side. No evidence of thrombus. Normal compressibility. Internal Jugular Vein: No evidence of thrombus. Normal compressibility, respiratory phasicity and response to augmentation. Subclavian Vein: No evidence of thrombus. Normal compressibility, respiratory phasicity and response to augmentation. Axillary Vein: No evidence of thrombus. Normal compressibility, respiratory phasicity and response to augmentation. Cephalic Vein: Occlusive thrombus noted in the cephalic vein in the antecubital fossa. Basilic Vein: No evidence of thrombus. Normal compressibility, respiratory phasicity and response to augmentation. Brachial Veins: No evidence of thrombus. Normal compressibility, respiratory phasicity and response to augmentation. Radial Veins: No evidence of thrombus. Normal compressibility, respiratory phasicity and response to augmentation. Ulnar Veins: No evidence of thrombus. Normal compressibility, respiratory phasicity and response to augmentation. Other Findings:  None visualized. IMPRESSION: 1. Superficial venous thrombosis of the right cephalic vein at the level of the antecubital fossa. 2. No right upper extremity DVT. Electronically Signed   By: Miachel Roux M.D.   On: 09/16/2021 12:35   DG Chest Portable 1 View  Result Date: 09/15/2021 CLINICAL DATA:  Weakness, fall. EXAM: PORTABLE CHEST 1 VIEW COMPARISON:  Chest radiograph dated 05/06/2008. FINDINGS: The heart  size is normal. Vascular calcifications are seen in the aortic arch. Mild diffuse bilateral interstitial opacities likely represent pulmonary edema. There is no pleural effusion or pneumothorax. Degenerative changes are seen in the spine. A left subclavian approach cardiac device is noted. Median sternotomy wires are intact and well aligned defibrillator pads overlie the chest. IMPRESSION: Mild diffuse bilateral interstitial opacities likely represent pulmonary edema. Aortic Atherosclerosis (ICD10-I70.0). Electronically Signed   By: Zerita Boers M.D.   On: 09/15/2021 13:41   ECHOCARDIOGRAM COMPLETE  Result Date: 09/18/2021    ECHOCARDIOGRAM REPORT   Patient Name:   Duane Owens Date of Exam: 09/18/2021 Medical Rec #:  [id number]         Height:       72.0 in Accession #:    [id number]        Weight:       182.1 lb Date of Birth:  03/18/1941         BSA:  2.047 m Patient Age:    75 years          BP:           118/53 mmHg Patient Gender: M                 HR:           65 bpm. Exam Location:  [facility] Procedure: 2D Echo Indications:     Bacteremia R78.81  History:         Patient has no prior history of Echocardiogram examinations.  Sonographer:     Kathlen Brunswick RDCS Referring Phys:  [id number] Tsosie Billing Diagnosing Phys: Kate Sable MD IMPRESSIONS  1. Left ventricular ejection fraction, by estimation, is 55 to 60%. The left ventricle has normal function. The left ventricle has no regional wall motion abnormalities. Left ventricular diastolic parameters were normal.  2. Right ventricular systolic function is normal. The right ventricular size is not well visualized.  3. Right atrial size was mild to moderately dilated.  4. The mitral valve is degenerative. Mild mitral valve regurgitation.  5. The aortic valve was not well visualized. Aortic valve regurgitation is mild. Aortic valve sclerosis/calcification is present, without any evidence of aortic stenosis.  6. Aortic dilatation noted.  There is mild dilatation of the aortic root, measuring 41 mm. FINDINGS  Left Ventricle: Left ventricular ejection fraction, by estimation, is 55 to 60%. The left ventricle has normal function. The left ventricle has no regional wall motion abnormalities. The left ventricular internal cavity size was normal in size. There is  no left ventricular hypertrophy. Left ventricular diastolic parameters were normal. Right Ventricle: The right ventricular size is not well visualized. No increase in right ventricular wall thickness. Right ventricular systolic function is normal. Left Atrium: Left atrial size was normal in size. Right Atrium: Right atrial size was mild to moderately dilated. Pericardium: There is no evidence of pericardial effusion. Mitral Valve: The mitral valve is degenerative in appearance. There is mild calcification of the mitral valve leaflet(s). Mild mitral annular calcification. Mild mitral valve regurgitation. Tricuspid Valve: The tricuspid valve is not well visualized. Tricuspid valve regurgitation is not demonstrated. Aortic Valve: The aortic valve was not well visualized. Aortic valve regurgitation is mild. Aortic regurgitation PHT measures 548 msec. Aortic valve sclerosis/calcification is present, without any evidence of aortic stenosis. Aortic valve peak gradient measures 11.6 mmHg. Pulmonic Valve: The pulmonic valve was not well visualized. Pulmonic valve regurgitation is not visualized. Aorta: Aortic dilatation noted. There is mild dilatation of the aortic root, measuring 41 mm. Venous: The inferior vena cava was not well visualized. IAS/Shunts: No atrial level shunt detected by color flow Doppler. Additional Comments: A device lead is visualized.  LEFT VENTRICLE PLAX 2D LVIDd:         6.20 cm      Diastology LVIDs:         4.50 cm      LV e' medial:    7.29 cm/s LV PW:         1.00 cm      LV E/e' medial:  11.9 LV IVS:        1.10 cm      LV e' lateral:   10.80 cm/s LVOT diam:     2.20 cm       LV E/e' lateral: 8.1 LV SV:         94 LV SV Index:   46 LVOT Area:     3.80 cm  LV Volumes (MOD) LV vol d, MOD A2C: 138.0 ml LV vol d, MOD A4C: 176.0 ml LV vol s, MOD A2C: 67.4 ml LV vol s, MOD A4C: 77.3 ml LV SV MOD A2C:     70.6 ml LV SV MOD A4C:     176.0 ml LV SV MOD BP:      85.2 ml RIGHT VENTRICLE RV Basal diam:  4.00 cm RV S prime:     15.00 cm/s TAPSE (M-mode): 2.0 cm LEFT ATRIUM           Index        RIGHT ATRIUM           Index LA diam:      4.30 cm 2.10 cm/m   RA Area:     26.60 cm LA Vol (A2C): 39.6 ml 19.34 ml/m  RA Volume:   101.00 ml 49.33 ml/m LA Vol (A4C): 46.0 ml 22.47 ml/m  AORTIC VALVE AV Area (Vmax): 2.64 cm AV Vmax:        170.00 cm/s AV Peak Grad:   11.6 mmHg LVOT Vmax:      118.00 cm/s LVOT Vmean:     73.000 cm/s LVOT VTI:       0.248 m AI PHT:         548 msec  AORTA Ao Root diam: 4.10 cm MITRAL VALVE MV Area (PHT): 3.00 cm    SHUNTS MV Decel Time: 253 msec    Systemic VTI:  0.25 m MV E velocity: 87.00 cm/s  Systemic Diam: 2.20 cm MV A velocity: 63.40 cm/s MV E/A ratio:  1.37 Kate Sable MD Electronically signed by Kate Sable MD Signature Date/Time: 09/18/2021/3:40:39 PM    Final    ECHO TEE  Result Date: 09/20/2021    TRANSESOPHOGEAL ECHO REPORT   Patient Name:   Duane Owens Date of Exam: 09/20/2021 Medical Rec #:  [id number]         Height:       72.0 in Accession #:    [id number]        Weight:       183.4 lb Date of Birth:  30-May-1941         BSA:          2.054 m Patient Age:    81 years          BP:           128/51 mmHg Patient Gender: M                 HR:           60 bpm. Exam Location:  [facility] Procedure: Transesophageal Echo, Cardiac Doppler and Color Doppler Indications:     Bacteremia R78.81  History:         Patient has prior history of Echocardiogram examinations, most                  recent 09/18/2021. CHF; Risk Factors:Dyslipidemia and                  Hypertension.  Sonographer:     Sherrie Sport Referring Phys:  [id number] Kate Sable Diagnosing  Phys: Kate Sable MD PROCEDURE: The transesophogeal probe was passed without difficulty through the esophogus of the patient. Sedation performed by performing physician. The patient developed no complications during the procedure. IMPRESSIONS  1. Left ventricular ejection fraction, by estimation, is 55 to 60%. The left ventricle has normal function.  2. Right ventricular systolic  function is normal. The right ventricular size is normal.  3. No left atrial/left atrial appendage thrombus was detected.  4. The mitral valve is normal in structure. Mild to moderate mitral valve regurgitation.  5. The aortic valve is tricuspid. Aortic valve regurgitation is mild to moderate. Aortic valve sclerosis is present, with no evidence of aortic valve stenosis.  6. Aortic dilatation noted. There is moderate dilatation of the aortic root, measuring 46 mm. There is mild dilatation of the ascending aorta, measuring 44 mm. Conclusion(s)/Recommendation(s): No evidence of vegetation/infective endocarditis on this transesophageael echocardiogram. FINDINGS  Left Ventricle: Left ventricular ejection fraction, by estimation, is 55 to 60%. The left ventricle has normal function. The left ventricular internal cavity size was normal in size. Right Ventricle: The right ventricular size is normal. No increase in right ventricular wall thickness. Right ventricular systolic function is normal. Left Atrium: Left atrial size was normal in size. No left atrial/left atrial appendage thrombus was detected. Right Atrium: Right atrial size was normal in size. Pericardium: There is no evidence of pericardial effusion. Mitral Valve: The mitral valve is normal in structure. Mild to moderate mitral valve regurgitation. Tricuspid Valve: The tricuspid valve is normal in structure. Tricuspid valve regurgitation is mild. Aortic Valve: The aortic valve is tricuspid. Aortic valve regurgitation is mild to moderate. Aortic valve sclerosis is present, with no  evidence of aortic valve stenosis. Pulmonic Valve: The pulmonic valve was normal in structure. Pulmonic valve regurgitation is trivial. Aorta: Aortic dilatation noted. There is moderate dilatation of the aortic root, measuring 46 mm. There is mild dilatation of the ascending aorta, measuring 44 mm. IAS/Shunts: No atrial level shunt detected by color flow Doppler. Additional Comments: A device lead is visualized.   AORTA Ao Root diam: 4.75 cm Kate Sable MD Electronically signed by Kate Sable MD Signature Date/Time: 09/20/2021/2:54:57 PM    Final    [place] EKG SITE RITE  Result Date: 09/21/2021 If [facility] image not attached, placement could not be confirmed due to current cardiac rhythm.  IR TUNNELED CENTRAL VENOUS CATHETER PLACEMENT  Result Date: 09/22/2021 Criselda Peaches, MD     09/22/2021 10:04 AM Interventional Radiology Procedure Note Procedure: Placement of a right IJ approach single lumen tunneled PowerLine.  Catheter tip at cavoatrial jxn and ready for use. Complications: None Estimated Blood Loss: None Recommendations: - Routine line care - Return to IR of completion of therapy for line removal. Signed, Criselda Peaches, MD   US ABDOMEN LIMITED RUQ (LIVER/GB)  Result Date: 09/15/2021 CLINICAL DATA:  Abnormal liver function tests EXAM: ULTRASOUND ABDOMEN LIMITED RIGHT UPPER QUADRANT COMPARISON:  None. FINDINGS: Gallbladder: No gallstones or wall thickening visualized. No sonographic Murphy sign noted by sonographer. Common bile duct: Diameter: 3.8 mm Liver: No focal lesion identified. Within normal limits in parenchymal echogenicity. Portal vein is patent on color Doppler imaging with normal direction of blood flow towards the liver. Other: None. IMPRESSION: No sonographic abnormality is seen in the right upper quadrant of abdomen. Electronically Signed   By: Elmer Picker M.D.   On: 09/15/2021 16:05    Microbiology: Results for orders placed or performed during the  hospital encounter of 09/15/21  MRSA Next Gen by PCR, Nasal     Status: None   Collection Time: 09/15/21  1:15 AM   Specimen: Nasal Mucosa; Nasal Swab  Result Value Ref Range Status   MRSA by PCR Next Gen NOT DETECTED NOT DETECTED Final    Comment: (NOTE) The GeneXpert MRSA Assay (FDA  approved for NASAL specimens only), is one component of a comprehensive MRSA colonization surveillance program. It is not intended to diagnose MRSA infection nor to guide or monitor treatment for MRSA infections. Test performance is not FDA approved in patients less than 1 years old. Performed at [facility], [address] [place], [place] [address]   Resp Panel by RT-PCR (Flu A&B, Covid) Nasopharyngeal Swab     Status: None   Collection Time: 09/15/21  1:28 PM   Specimen: Nasopharyngeal Swab; Nasopharyngeal(NP) swabs in vial transport medium  Result Value Ref Range Status   SARS Coronavirus 2 by RT PCR NEGATIVE NEGATIVE Final    Comment: (NOTE) SARS-CoV-2 target nucleic acids are NOT DETECTED.  The SARS-CoV-2 RNA is generally detectable in upper respiratory specimens during the acute phase of infection. The lowest concentration of SARS-CoV-2 viral copies this assay can detect is 138 copies/mL. A negative result does not preclude SARS-Cov-2 infection and should not be used as the sole basis for treatment or other patient management decisions. A negative result may occur with  improper specimen collection/handling, submission of specimen other than nasopharyngeal swab, presence of viral mutation(s) within the areas targeted by this assay, and inadequate number of viral copies(<138 copies/mL). A negative result must be combined with clinical observations, patient history, and epidemiological information. The expected result is Negative.  Fact Sheet for Patients:  EntrepreneurPulse.com.au  Fact Sheet for Healthcare Providers:   IncredibleEmployment.be  This test is no t yet approved or cleared by the [place] FDA and  has been authorized for detection and/or diagnosis of SARS-CoV-2 by FDA under an Emergency Use Authorization (EUA). This EUA will remain  in effect (meaning this test can be used) for the duration of the COVID-19 declaration under Section 564(b)(1) of the Act, 21 U.S.C.section 360bbb-3(b)(1), unless the authorization is terminated  or revoked sooner.       Influenza A by PCR NEGATIVE NEGATIVE Final   Influenza B by PCR NEGATIVE NEGATIVE Final    Comment: (NOTE) The Xpert Xpress SARS-CoV-2/FLU/RSV plus assay is intended as an aid in the diagnosis of influenza from Nasopharyngeal swab specimens and should not be used as a sole basis for treatment. Nasal washings and aspirates are unacceptable for Xpert Xpress SARS-CoV-2/FLU/RSV testing.  Fact Sheet for Patients: EntrepreneurPulse.com.au  Fact Sheet for Healthcare Providers: IncredibleEmployment.be  This test is not yet approved or cleared by the [place] FDA and has been authorized for detection and/or diagnosis of SARS-CoV-2 by FDA under an Emergency Use Authorization (EUA). This EUA will remain in effect (meaning this test can be used) for the duration of the COVID-19 declaration under Section 564(b)(1) of the Act, 21 U.S.C. section 360bbb-3(b)(1), unless the authorization is terminated or revoked.  Performed at [facility], [address], [place], [place] [address]   Urine Culture     Status: None   Collection Time: 09/15/21  5:05 PM   Specimen: Urine, Random  Result Value Ref Range Status   Specimen Description   Final    URINE, RANDOM Performed at [facility], [address], [place], [place] [address]    Special Requests   Final    NONE Performed at [facility], [address], [place], [place] [address]    Culture   Final     NO GROWTH Performed at [facility], [place] [address] [place],  [address]    Report Status 09/17/2021 FINAL  Final  Culture, blood (Routine X 2)  w Reflex to ID Panel     Status: Abnormal   Collection Time: 09/15/21 11:54 PM   Specimen: BLOOD  Result Value Ref Range Status   Specimen Description   Final    BLOOD LEFT FOREARM Performed at [facility], [address] [place], [place] [address]    Special Requests   Final    BOTTLES DRAWN AEROBIC AND ANAEROBIC Blood Culture results may not be optimal due to an inadequate volume of blood received in culture bottles Performed at [facility], [address] [place], [place] [address]    Culture  Setup Time   Final    GRAM POSITIVE COCCI IN BOTH AEROBIC AND ANAEROBIC BOTTLES Organism ID to follow CRITICAL RESULT CALLED TO, READ BACK BY AND VERIFIED WITH: NATHAN BELUE @0028  ON 09/17/21 [facility] GRAM STAIN REVIEWED-AGREE WITH RESULT Performed at [facility], [address] [place], [place] [address]    Culture STAPHYLOCOCCUS AUREUS (A)  Final   Report Status 09/19/2021 FINAL  Final   Organism ID, Bacteria STAPHYLOCOCCUS AUREUS  Final      Susceptibility   Staphylococcus aureus - MIC*    CIPROFLOXACIN <=0.5 SENSITIVE Sensitive     ERYTHROMYCIN <=0.25 SENSITIVE Sensitive     GENTAMICIN <=0.5 SENSITIVE Sensitive     OXACILLIN <=0.25 SENSITIVE Sensitive     TETRACYCLINE <=1 SENSITIVE Sensitive     VANCOMYCIN <=0.5 SENSITIVE Sensitive     TRIMETH/SULFA <=10 SENSITIVE Sensitive     CLINDAMYCIN <=0.25 SENSITIVE Sensitive     RIFAMPIN <=0.5 SENSITIVE Sensitive     Inducible Clindamycin NEGATIVE Sensitive     * STAPHYLOCOCCUS AUREUS  Blood Culture ID Panel (Reflexed)     Status: Abnormal   Collection Time: 09/15/21 11:54 PM  Result Value Ref Range Status   Enterococcus faecalis NOT DETECTED NOT DETECTED Final   Enterococcus Faecium NOT DETECTED NOT DETECTED Final   Listeria monocytogenes NOT  DETECTED NOT DETECTED Final   Staphylococcus species DETECTED (A) NOT DETECTED Final    Comment: CRITICAL RESULT CALLED TO, READ BACK BY AND VERIFIED WITH: NATHAN BELUE @0028  ON 09/17/21 SKL    Staphylococcus aureus (BCID) DETECTED (A) NOT DETECTED Final    Comment: CRITICAL RESULT CALLED TO, READ BACK BY AND VERIFIED WITH: NATHAN BELUE @0028  ON 09/17/21 SKL    Staphylococcus epidermidis NOT DETECTED NOT DETECTED Final   Staphylococcus lugdunensis NOT DETECTED NOT DETECTED Final   Streptococcus species NOT DETECTED NOT DETECTED Final   Streptococcus agalactiae NOT DETECTED NOT DETECTED Final   Streptococcus pneumoniae NOT DETECTED NOT DETECTED Final   Streptococcus pyogenes NOT DETECTED NOT DETECTED Final   A.calcoaceticus-baumannii NOT DETECTED NOT DETECTED Final   Bacteroides fragilis NOT DETECTED NOT DETECTED Final   Enterobacterales NOT DETECTED NOT DETECTED Final   Enterobacter cloacae complex NOT DETECTED NOT DETECTED Final   Escherichia coli NOT DETECTED NOT DETECTED Final   Klebsiella aerogenes NOT DETECTED NOT DETECTED Final   Klebsiella oxytoca NOT DETECTED NOT DETECTED Final   Klebsiella pneumoniae NOT DETECTED NOT DETECTED Final   Proteus species NOT DETECTED NOT DETECTED Final   Salmonella species NOT DETECTED NOT DETECTED Final   Serratia marcescens NOT DETECTED NOT DETECTED Final   Haemophilus influenzae NOT DETECTED NOT DETECTED Final   Neisseria meningitidis NOT DETECTED NOT DETECTED Final   Pseudomonas aeruginosa NOT DETECTED NOT DETECTED Final   Stenotrophomonas maltophilia NOT DETECTED NOT DETECTED Final   Candida albicans NOT DETECTED NOT DETECTED Final   Candida auris NOT DETECTED NOT  DETECTED Final   Candida glabrata NOT DETECTED NOT DETECTED Final   Candida krusei NOT DETECTED NOT DETECTED Final   Candida parapsilosis NOT DETECTED NOT DETECTED Final   Candida tropicalis NOT DETECTED NOT DETECTED Final   Cryptococcus neoformans/gattii NOT DETECTED NOT DETECTED  Final   Meth resistant mecA/C and MREJ NOT DETECTED NOT DETECTED Final    Comment: Performed at [facility], [address] [place], [place] [address]  Culture, blood (Routine X 2) w Reflex to ID Panel     Status: None (Preliminary result)   Collection Time: 09/16/21  1:44 AM   Specimen: BLOOD  Result Value Ref Range Status   Specimen Description BLOOD LEFT HAND  Final   Special Requests   Final    BOTTLES DRAWN AEROBIC AND ANAEROBIC Blood Culture adequate volume   Culture   Final    NO GROWTH 4 DAYS Performed at [facility], [address] [place], [place] [address]    Report Status PENDING  Incomplete  CULTURE, BLOOD (ROUTINE X 2) w Reflex to ID Panel     Status: None (Preliminary result)   Collection Time: 09/17/21 11:47 PM   Specimen: BLOOD  Result Value Ref Range Status   Specimen Description BLOOD BLOOD LEFT WRIST  Final   Special Requests   Final    BOTTLES DRAWN AEROBIC AND ANAEROBIC Blood Culture adequate volume   Culture   Final    NO GROWTH 4 DAYS Performed at [facility], [address] [place], [place] [address]    Report Status PENDING  Incomplete  CULTURE, BLOOD (ROUTINE X 2) w Reflex to ID Panel     Status: None (Preliminary result)   Collection Time: 09/17/21 11:47 PM   Specimen: BLOOD  Result Value Ref Range Status   Specimen Description BLOOD BLOOD LEFT HAND  Final   Special Requests   Final    BOTTLES DRAWN AEROBIC AND ANAEROBIC Blood Culture adequate volume   Culture   Final    NO GROWTH 4 DAYS Performed at [facility], [address] [place], [place] [address]    Report Status PENDING  Incomplete    Labs: CBC: Recent Labs  Lab 09/17/21 0415 09/18/21 0547 09/19/21 0435 09/21/21 0611 09/22/21 0539  WBC 10.2 8.2 8.0 10.4 8.6  NEUTROABS 7.9* 5.6 5.0 6.6 5.3  HGB 7.6* 7.3* 7.5* 7.4* 7.2*  HCT 22.5* 21.8* 23.0* 22.8* 22.0*  MCV 92.2 91.2 91.6 92.7 93.2  PLT 118* 124* 136* 186 790   Basic Metabolic  Panel: Recent Labs  Lab 09/18/21 0547 09/19/21 0435 09/20/21 0422 09/20/21 1505 09/21/21 0611 09/22/21 0539  NA 138 138 141  --  139 138  K 3.7 3.6 3.7  --  3.3* 3.7  CL 113* 112* 111  --  111 110  CO2 16* 20* 21*  --  22 23  GLUCOSE 109* 98 86 139* 133* 98  BUN 28* 23 19  --  15 14  CREATININE 0.94 1.06 0.95  --  1.02 0.87  CALCIUM 7.9* 8.1* 8.2*  --  8.0* 7.9*  MG 2.1 1.8 1.8  --  1.8 1.7  PHOS 2.1* 2.4* 2.6  --  1.9* 3.0   Liver Function Tests: Recent Labs  Lab 09/17/21 0415  AST 88*  ALT 55*  ALKPHOS 75  BILITOT 0.8  PROT 5.3*  ALBUMIN 2.5*   CBG: Recent Labs  Lab 09/16/21 0129 09/20/21 1458  GLUCAP 113* 145*    Discharge time  spent: greater than 30 minutes.  Signed: Loletha Grayer, MD [facility] 09/22/2021

## 2021-09-23 DIAGNOSIS — I808 Phlebitis and thrombophlebitis of other sites: Secondary | ICD-10-CM | POA: Diagnosis not present

## 2021-09-23 DIAGNOSIS — I130 Hypertensive heart and chronic kidney disease with heart failure and stage 1 through stage 4 chronic kidney disease, or unspecified chronic kidney disease: Secondary | ICD-10-CM | POA: Diagnosis not present

## 2021-09-23 DIAGNOSIS — Z452 Encounter for adjustment and management of vascular access device: Secondary | ICD-10-CM | POA: Diagnosis not present

## 2021-09-23 DIAGNOSIS — N179 Acute kidney failure, unspecified: Secondary | ICD-10-CM | POA: Diagnosis not present

## 2021-09-23 DIAGNOSIS — I5022 Chronic systolic (congestive) heart failure: Secondary | ICD-10-CM | POA: Diagnosis not present

## 2021-09-23 DIAGNOSIS — D631 Anemia in chronic kidney disease: Secondary | ICD-10-CM | POA: Diagnosis not present

## 2021-09-23 DIAGNOSIS — A4101 Sepsis due to Methicillin susceptible Staphylococcus aureus: Secondary | ICD-10-CM | POA: Diagnosis not present

## 2021-09-23 DIAGNOSIS — D696 Thrombocytopenia, unspecified: Secondary | ICD-10-CM | POA: Diagnosis not present

## 2021-09-23 DIAGNOSIS — N183 Chronic kidney disease, stage 3 unspecified: Secondary | ICD-10-CM | POA: Diagnosis not present

## 2021-09-23 DIAGNOSIS — L03113 Cellulitis of right upper limb: Secondary | ICD-10-CM | POA: Diagnosis not present

## 2021-09-23 LAB — CULTURE, BLOOD (ROUTINE X 2)
Culture: NO GROWTH
Culture: NO GROWTH
Culture: NO GROWTH
Special Requests: ADEQUATE
Special Requests: ADEQUATE
Special Requests: ADEQUATE

## 2021-09-27 ENCOUNTER — Ambulatory Visit: Payer: Medicare HMO | Admitting: Family

## 2021-09-27 DIAGNOSIS — E559 Vitamin D deficiency, unspecified: Secondary | ICD-10-CM | POA: Diagnosis not present

## 2021-09-27 DIAGNOSIS — N189 Chronic kidney disease, unspecified: Secondary | ICD-10-CM | POA: Diagnosis not present

## 2021-09-27 DIAGNOSIS — I739 Peripheral vascular disease, unspecified: Secondary | ICD-10-CM | POA: Diagnosis not present

## 2021-09-28 DIAGNOSIS — K219 Gastro-esophageal reflux disease without esophagitis: Secondary | ICD-10-CM | POA: Diagnosis not present

## 2021-09-28 DIAGNOSIS — E559 Vitamin D deficiency, unspecified: Secondary | ICD-10-CM | POA: Diagnosis not present

## 2021-09-28 DIAGNOSIS — I739 Peripheral vascular disease, unspecified: Secondary | ICD-10-CM | POA: Diagnosis not present

## 2021-09-28 DIAGNOSIS — I808 Phlebitis and thrombophlebitis of other sites: Secondary | ICD-10-CM | POA: Diagnosis not present

## 2021-09-28 DIAGNOSIS — N189 Chronic kidney disease, unspecified: Secondary | ICD-10-CM | POA: Diagnosis not present

## 2021-09-28 DIAGNOSIS — M109 Gout, unspecified: Secondary | ICD-10-CM | POA: Diagnosis not present

## 2021-09-28 DIAGNOSIS — I10 Essential (primary) hypertension: Secondary | ICD-10-CM | POA: Diagnosis not present

## 2021-09-28 DIAGNOSIS — D696 Thrombocytopenia, unspecified: Secondary | ICD-10-CM | POA: Diagnosis not present

## 2021-09-28 DIAGNOSIS — R69 Illness, unspecified: Secondary | ICD-10-CM | POA: Diagnosis not present

## 2021-09-29 ENCOUNTER — Other Ambulatory Visit: Payer: Self-pay | Admitting: Internal Medicine

## 2021-09-29 ENCOUNTER — Ambulatory Visit: Payer: Medicare HMO | Admitting: Internal Medicine

## 2021-09-29 ENCOUNTER — Encounter: Payer: Self-pay | Admitting: Internal Medicine

## 2021-09-29 ENCOUNTER — Other Ambulatory Visit: Payer: Self-pay

## 2021-09-29 ENCOUNTER — Telehealth: Payer: Self-pay

## 2021-09-29 VITALS — BP 146/71 | HR 67 | Temp 98.1°F | Wt 183.0 lb

## 2021-09-29 DIAGNOSIS — Z452 Encounter for adjustment and management of vascular access device: Secondary | ICD-10-CM | POA: Insufficient documentation

## 2021-09-29 DIAGNOSIS — N179 Acute kidney failure, unspecified: Secondary | ICD-10-CM | POA: Diagnosis not present

## 2021-09-29 DIAGNOSIS — A4101 Sepsis due to Methicillin susceptible Staphylococcus aureus: Secondary | ICD-10-CM

## 2021-09-29 DIAGNOSIS — A419 Sepsis, unspecified organism: Secondary | ICD-10-CM | POA: Diagnosis not present

## 2021-09-29 DIAGNOSIS — R6521 Severe sepsis with septic shock: Secondary | ICD-10-CM

## 2021-09-29 DIAGNOSIS — R6510 Systemic inflammatory response syndrome (SIRS) of non-infectious origin without acute organ dysfunction: Secondary | ICD-10-CM

## 2021-09-29 NOTE — Progress Notes (Deleted)
Per [name] [phone] with specialty scheduling at [facility] is requesting orders to be updated. Orders for tunneled picc placed to be signed by Dr. Juleen China. [name] will call patient with updated appointment time/location.  ? ?Duane Owens ? ?

## 2021-09-29 NOTE — Addendum Note (Signed)
Addended by: Eugenia Mcalpine on: 09/29/2021 02:48 PM ? ? Modules accepted: Orders ? ?

## 2021-09-29 NOTE — Progress Notes (Signed)
Per [name] [phone] with specialty scheduling at [facility] is requesting orders to be updated. Orders for tunneled picc placed to be signed by Dr. Juleen China. [name] will call patient with updated appointment time/location.  ? ?Duane Owens ? ?

## 2021-09-29 NOTE — Progress Notes (Addendum)
Order placed in visit encounter. Duplicate order canceled.  ?Duane Owens ? ?

## 2021-09-29 NOTE — Assessment & Plan Note (Signed)
Due MSSA and resolved.  ?

## 2021-09-29 NOTE — Assessment & Plan Note (Signed)
Patient is on cefazolin x 4 weeks via tunneled central line through 10/15/21 for MSSA bacteremia with septic thrombophlebitis.  Planning for 4 week course in the setting of patient having an ICD and infection involving the vessel wall.  TEE was negative for vegetation or involvement of ICD.  Patient will need to return to IR for line removal at conclusion of antibiotic course.  Will place IR eval/management order today for follow up. ?

## 2021-09-29 NOTE — Assessment & Plan Note (Signed)
No issues with central line.  Will be removed at conclusion of antibiotics. ?

## 2021-09-29 NOTE — Assessment & Plan Note (Signed)
Presented with AKI at admission but this normalized by discharge. ?

## 2021-09-29 NOTE — Addendum Note (Signed)
Addended by: Eugenia Mcalpine on: 09/29/2021 02:42 PM ? ? Modules accepted: Orders ? ?

## 2021-09-29 NOTE — Progress Notes (Signed)
?  ? ? ? ? ?[place] for Infectious Disease ? ?CHIEF COMPLAINT:   ? ?Follow up for bacteremia ? ?SUBJECTIVE:   ? ?Duane Owens is a 81 y.o. male with PMHx as below who presents to the clinic for bacteremia.  ? ?Patient was admitted recently at [facility] 2/22-09/22/21 with severe sepsis due to MSSA bacteremia from cellulitis of his right arm and superficial phlebitis.  He had a DVT study done on 2/23 which found a superficial venous thrombosis of the right cephalic vein presumably a septic thrombus.  He underwent TEE due the presence of an ICD and this was negative.  Repeat blood cultures from 2/24 were negative.  He had a tunneled line placed with IR and was discharged on 4 weeks of cefazolin through 10/15/21.  He has been doing well since discharge last week and reports no issues with his antibiotics or central line.  No fevers, chills. Right arm cellulitis is resolved at this time.  He also had swelling and pain in left arm.  Fortunately, DVT study of this extremity was negative. He continues to have some swelling in the left arm but this is improved.  ? ?Please see A&P for the details of today's visit and status of the patient's medical problems.  ? ?Patient's Medications  ?New Prescriptions  ? No medications on file  ?Previous Medications  ? ALLOPURINOL (ZYLOPRIM) 300 MG TABLET    Take 300 mg by mouth daily.  ? APIXABAN (ELIQUIS) 5 MG TABS TABLET    Take 5 mg by mouth 2 (two) times daily.  ? ATORVASTATIN (LIPITOR) 20 MG TABLET    Take 20 mg by mouth daily.  ? CEFAZOLIN (ANCEF) IVPB    Inject 6 g into the vein continuous for 24 days. Infuse cefazolin 6gm daily IV as continuous infusion over 24hs  ?Indication:  MSSA bacteremia ?First Dose: Yes ?Last Day of Therapy: 10/15/2021 ?Labs - Once weekly:  CBC/D and BMP ?Please pull PIC at completion of IV antibiotics ?Fax weekly labs to [phone] ?Method of administration: elastomeric ?Method of administration may be changed at the discretion of home infusion  pharmacist based upon assessment of the patient and/or caregiver's ability to self-administer the medication ordered.  ? METOPROLOL SUCCINATE (TOPROL-XL) 50 MG 24 HR TABLET    Take 50 mg by mouth daily.  ? MIRTAZAPINE (REMERON) 15 MG TABLET    Mirtazapine 15 MG Oral Tablet QTY: 90 each Days: 90 Refills: 0  Written: 06/05/20 Patient Instructions: TAKE 1 TABLET BY MOUTH AT BEDTIME  ? MITIGARE 0.6 MG CAPS    Take 0.3 mg by mouth daily.  ? MULTIVITAMIN (ONE-A-DAY MEN'S) TABS TABLET    Take 1 tablet by mouth daily.  ? NITROGLYCERIN (NITROSTAT) 0.4 MG SL TABLET    Place under the tongue.  ? PANTOPRAZOLE (PROTONIX) 40 MG TABLET    Take 40 mg by mouth daily.  ? TRAZODONE (DESYREL) 50 MG TABLET    Take by mouth.  ?Modified Medications  ? No medications on file  ?Discontinued Medications  ? No medications on file  ?   ? ?Past Medical History:  ?Diagnosis Date  ? Anemia   ? Atherosclerosis   ? Cancer [facility])   ? prostate  ? CHF (congestive heart failure) ([place])   ? Hypercholesteremia   ? Hyperlipemia   ? Hypertension   ? Spinal stenosis   ? Varicose vein   ? Venous insufficiency   ? ? ?Social History  ? ?Tobacco Use  ?  Smoking status: Every Day  ?  Packs/day: 0.50  ?  Years: 5.00  ?  Pack years: 2.50  ?  Types: Cigarettes  ? Smokeless tobacco: Never  ? Tobacco comments:  ?  Has called [facility] and ordered nicotene patches. Will Quit when patches start  ?Vaping Use  ? Vaping Use: Never used  ?Substance Use Topics  ? Alcohol use: No  ? Drug use: No  ? ? ?Family History  ?Problem Relation Age of Onset  ? Heart failure Mother   ? Diabetes Brother   ? COPD Brother   ? ? ?Allergies  ?Allergen Reactions  ? Amiodarone Other (See Comments)  ?  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  Would consider alternative agents if possible.  ? ? ?Review of Systems  ?All other systems reviewed and are negative. Except as noted above.  ? ? ?OBJECTIVE:   ? ?Vitals:  ? 09/29/21 0936  ?BP: (!) 146/71  ?Pulse: 67   ?Temp: 98.1 ?F (36.7 ?C)  ?TempSrc: Oral  ?Weight: 183 lb (83 kg)  ? ?Body mass index is 24.82 kg/m?. ? ?Physical Exam ?Constitutional:   ?   General: He is not in acute distress. ?   Appearance: Normal appearance.  ?HENT:  ?   Head: Normocephalic and atraumatic.  ?Eyes:  ?   Extraocular Movements: Extraocular movements intact.  ?   Conjunctiva/sclera: Conjunctivae normal.  ?Cardiovascular:  ?   Rate and Rhythm: Normal rate and regular rhythm.  ?Pulmonary:  ?   Effort: Pulmonary effort is normal. No respiratory distress.  ?   Breath sounds: Normal breath sounds.  ?Musculoskeletal:     ?   General: Swelling present.  ?   Comments: No evidence of right arm swelling, tenderness or erythema. ?Left arm has some distal swelling but is otherwise soft, perfused, and not tender.   ?Skin: ?   General: Skin is warm and dry.  ?   Findings: No erythema or rash.  ?Neurological:  ?   General: No focal deficit present.  ?   Mental Status: He is alert and oriented to person, place, and time.  ?Psychiatric:     ?   Mood and Affect: Mood normal.     ?   Behavior: Behavior normal.  ? ? ? ?Labs and Microbiology: ?CBC Latest Ref Rng & Units 09/22/2021 09/21/2021 09/19/2021  ?WBC 4.0 - 10.5 K/uL 8.6 10.4 8.0  ?Hemoglobin 13.0 - 17.0 g/dL 7.2(L) 7.4(L) 7.5(L)  ?Hematocrit 39.0 - 52.0 % 22.0(L) 22.8(L) 23.0(L)  ?Platelets 150 - 400 K/uL 196 186 136(L)  ? ?CMP Latest Ref Rng & Units 09/22/2021 09/21/2021 09/20/2021  ?Glucose 70 - 99 mg/dL 98 133(H) 139(H)  ?BUN 8 - 23 mg/dL 14 15 -  ?Creatinine 0.61 - 1.24 mg/dL 0.87 1.02 -  ?Sodium 135 - 145 mmol/L 138 139 -  ?Potassium 3.5 - 5.1 mmol/L 3.7 3.3(L) -  ?Chloride 98 - 111 mmol/L 110 111 -  ?CO2 22 - 32 mmol/L 23 22 -  ?Calcium 8.9 - 10.3 mg/dL 7.9(L) 8.0(L) -  ?Total Protein 6.5 - 8.1 g/dL - - -  ?Total Bilirubin 0.3 - 1.2 mg/dL - - -  ?Alkaline Phos 38 - 126 U/L - - -  ?AST 15 - 41 U/L - - -  ?ALT 0 - 44 U/L - - -  ?  ? ? ?ASSESSMENT & PLAN:   ? ?MSSA (methicillin susceptible Staphylococcus aureus)  septicemia ([place]) ?Patient is on cefazolin x 4  weeks via tunneled central line through 10/15/21 for MSSA bacteremia with septic thrombophlebitis.  Planning for 4 week course in the setting of patient having an ICD and infection involving the vessel wall.  TEE was negative for vegetation or involvement of ICD.  Patient will need to return to IR for line removal at conclusion of antibiotic course.  Will place IR eval/management order today for follow up. ? ?Acute kidney injury ([place]) ?Presented with AKI at admission but this normalized by discharge. ? ?PICC (peripherally inserted central catheter) in place ?No issues with central line.  Will be removed at conclusion of antibiotics. ? ?SIRS (systemic inflammatory response syndrome) (HCC) ?Due MSSA and resolved.  ? ? ? ?Mignon Pine ?[place] for Infectious Disease ?[facility] ?09/29/2021, 10:01 AM ? ?I spent 40 minutes dedicated to the care of this patient on the date of this encounter to include pre-visit review of records, face-to-face time with the patient discussing MSSA infection, PICC line, AKI, and post-visit ordering of testing.  ? ? ?

## 2021-09-29 NOTE — Patient Instructions (Signed)
Thank you for coming to see me today. It was a pleasure seeing you. ? ?To Do: ?Continue IV antibiotics through 10/15/21 ?Interventional radiology will call you to schedule removal of your central line after the antibiotic course is done ?If you have not heard from them, please let our office know ? ?If you have any questions or concerns, please do not hesitate to call the office at [phone]. ? ?Take Care,  ? ?Jule Ser ? ?

## 2021-09-29 NOTE — Telephone Encounter (Signed)
I spoke with [name] [phone] specialty scheduling at [facility] and she is going to have the nurse look at the order that Dr. Juleen China has place to removed the tunneled central line. [name] will call our office back to let us know if this needs to be ordered differently and will get the patient scheduled. Per Dr. Juleen China patient can get central line removed on 10/15/21 if possible and last day of IV antibiotics can be on 10/15/22 if [facility] can get him in on 10/15/21 ?Autumn Gunn T Imran Nuon ? ?

## 2021-09-30 DIAGNOSIS — A4101 Sepsis due to Methicillin susceptible Staphylococcus aureus: Secondary | ICD-10-CM | POA: Diagnosis not present

## 2021-09-30 DIAGNOSIS — L03113 Cellulitis of right upper limb: Secondary | ICD-10-CM | POA: Diagnosis not present

## 2021-10-02 DIAGNOSIS — A4101 Sepsis due to Methicillin susceptible Staphylococcus aureus: Secondary | ICD-10-CM | POA: Diagnosis not present

## 2021-10-02 DIAGNOSIS — L03113 Cellulitis of right upper limb: Secondary | ICD-10-CM | POA: Diagnosis not present

## 2021-10-05 NOTE — Progress Notes (Signed)
? Patient ID: Duane Owens, male    DOB: [birth year], 81 y.o.   MRN: [id number] ? ?HPI ? ?Mr Morones is a 81 y/o male with a history of atrial fibrillation, prostate cancer, CAD, hyperlipidemia, HTN, anemia, septicemia, spinal stenosis, tobacco use and chronic heart failure.  ? ?TEE report from 09/20/21 reviewed and showed an EF of 55-60% along with mild/moderate MR/AR and no thrombus.  ? ?Admitted 09/15/21 due to septic shock secondary to MSSA septicemia and cellulitis of the right arm with superficial phlebitis. IV antibiotics started through PICC line. Cardiology and ID consults obtained. Elevated troponin thought to be due to shock. Entresto held due to soft BP. MRA held due to AKI. OT evaluation done. Discharged after 7 days. Admitted 09/09/21 due to dark stools, nausea and dizziness. Initially given IV PPI with transition to oral PPI. Head CT without acute findings. IVF given. HTN meds held due to hypotension. Discharged after 4 days.  ? ?He presents today for his initial visit with a chief complaint of moderate fatigue with minimal exertion. Describes this as chronic in nature. He reports fluctuating appetite along with this. He denies any dizziness, difficulty sleeping, abdominal distention, palpitations, pedal edema, chest pain or cough.  ? ?Not weighing daily but does have scales at home. During recent admission, entresto, MRA and diuretics were stopped due to hypotension.  ? ?Received phone call from [facility] regarding his fluid monitoring device showed increase in fluid but he doesn't feel any different.  ? ?Past Medical History:  ?Diagnosis Date  ? Anemia   ? Arrhythmia   ? atrial fibrillation  ? Atherosclerosis   ? Cancer [facility])   ? prostate  ? CHF (congestive heart failure) ([place])   ? Coronary artery disease   ? Hypercholesteremia   ? Hyperlipemia   ? Hypertension   ? Septicemia ([place])   ? Spinal stenosis   ? Varicose vein   ? Venous insufficiency   ? ?Past Surgical History:  ?Procedure Laterality Date  ?  BACK SURGERY    ? CYSTOSCOPY WITH DIRECT VISION INTERNAL URETHROTOMY N/A 02/02/2016  ? Procedure: CYSTOSCOPY WITH DIRECT VISION INTERNAL URETHROTOMY;  Surgeon: Royston Cowper, MD;  Location: [facility] ORS;  Service: Urology;  Laterality: N/A;  ? HOLMIUM LASER APPLICATION N/A 7/82/9562  ? Procedure: HOLMIUM LASER APPLICATION;  Surgeon: Royston Cowper, MD;  Location: [facility] ORS;  Service: Urology;  Laterality: N/A;  ? IR PERC TUN PERIT CATH WO PORT S&I /IMAG  09/22/2021  ? PERIPHERAL VASCULAR CATHETERIZATION N/A 08/11/2015  ? Procedure: Abdominal Aortogram w/Lower Extremity;  Surgeon: Katha Cabal, MD;  Location: [place] CV LAB;  Service: Cardiovascular;  Laterality: N/A;  ? PERIPHERAL VASCULAR CATHETERIZATION  08/11/2015  ? Procedure: Lower Extremity Intervention;  Surgeon: Katha Cabal, MD;  Location: [place] CV LAB;  Service: Cardiovascular;;  ? TEE WITHOUT CARDIOVERSION N/A 09/20/2021  ? Procedure: TRANSESOPHAGEAL ECHOCARDIOGRAM (TEE);  Surgeon: Kate Sable, MD;  Location: [facility] ORS;  Service: Cardiovascular;  Laterality: N/A;  ? TONSILLECTOMY    ? ?Family History  ?Problem Relation Age of Onset  ? Heart failure Mother   ? Diabetes Brother   ? COPD Brother   ? ?Social History  ? ?Tobacco Use  ? Smoking status: Former  ?  Packs/day: 0.50  ?  Years: 5.00  ?  Pack years: 2.50  ?  Types: Cigarettes  ? Smokeless tobacco: Never  ? Tobacco comments:  ?  Has called [facility] and ordered nicotene patches. Will  Quit when patches start  ?Substance Use Topics  ? Alcohol use: No  ? ?Allergies  ?Allergen Reactions  ? Amiodarone Other (See Comments)  ?  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  Would consider alternative agents if possible.  ? ?Prior to Admission medications   ?Medication Sig Start Date End Date Taking? Authorizing Provider  ?allopurinol (ZYLOPRIM) 300 MG tablet Take 300 mg by mouth daily.   Yes [provider]  ?apixaban (ELIQUIS) 5 MG TABS tablet  Take 5 mg by mouth 2 (two) times daily.   Yes [provider]  ?atorvastatin (LIPITOR) 20 MG tablet Take 20 mg by mouth daily.   Yes [provider]  ?ceFAZolin (ANCEF) IVPB Inject 6 g into the vein continuous for 24 days. Infuse cefazolin 6gm daily IV as continuous infusion over 24hs  ?Indication:  MSSA bacteremia ?First Dose: Yes ?Last Day of Therapy: 10/15/2021 ?Labs - Once weekly:  CBC/D and BMP ?Please pull PIC at completion of IV antibiotics ?Fax weekly labs to [phone] ?Method of administration: elastomeric ?Method of administration may be changed at the discretion of home infusion pharmacist based upon assessment of the patient and/or caregiver's ability to self-administer the medication ordered. 09/21/21 10/15/21 Yes Sharen Hones, MD  ?metoprolol succinate (TOPROL-XL) 50 MG 24 hr tablet Take 50 mg by mouth daily. 09/13/21  Yes [provider]  ?nitroGLYCERIN (NITROSTAT) 0.4 MG SL tablet Place under the tongue. ?Patient not taking: Reported on 10/07/2021 02/22/19   [provider]  ? ? ? ?Review of Systems  ?Constitutional:  Positive for appetite change (fluctuates) and fatigue (easily).  ?HENT:  Positive for hearing loss. Negative for rhinorrhea and sore throat.   ?Eyes: Negative.   ?Respiratory:  Positive for shortness of breath. Negative for cough.   ?Cardiovascular:  Negative for chest pain, palpitations and leg swelling.  ?Gastrointestinal:  Negative for abdominal distention and abdominal pain.  ?Endocrine: Negative.   ?Genitourinary: Negative.   ?Musculoskeletal:  Negative for back pain and neck pain.  ?Skin: Negative.   ?Allergic/Immunologic: Negative.   ?Neurological:  Negative for dizziness and light-headedness.  ?Hematological:  Negative for adenopathy. Bruises/bleeds easily.  ?Psychiatric/Behavioral:  Negative for dysphoric mood and sleep disturbance (sleeping on 2 pillows). The patient is not nervous/anxious.   ? ?Vitals:  ? 10/07/21 1440  ?BP: (!) 141/50   ?Pulse: 77  ?Resp: 18  ?SpO2: 100%  ?Weight: 178 lb 2 oz (80.8 kg)  ?Height: 6' (1.829 m)  ? ?Wt Readings from Last 3 Encounters:  ?10/07/21 178 lb 2 oz (80.8 kg)  ?09/29/21 183 lb (83 kg)  ?09/22/21 185 lb 10 oz (84.2 kg)  ? ?Lab Results  ?Component Value Date  ? CREATININE 0.87 09/22/2021  ? CREATININE 1.02 09/21/2021  ? CREATININE 0.95 09/20/2021  ? ? ?Physical Exam ?Vitals and nursing note reviewed.  ?Constitutional:   ?   Appearance: Normal appearance.  ?HENT:  ?   Head: Normocephalic and atraumatic.  ?   Right Ear: Decreased hearing noted.  ?   Left Ear: Decreased hearing noted.  ?Cardiovascular:  ?   Rate and Rhythm: Normal rate and regular rhythm.  ?Pulmonary:  ?   Effort: Pulmonary effort is normal. No respiratory distress.  ?   Breath sounds: No wheezing or rales.  ?Abdominal:  ?   General: There is no distension.  ?   Palpations: Abdomen is soft.  ?Musculoskeletal:     ?   General: No tenderness.  ?   Cervical back:  Normal range of motion and neck supple.  ?   Right lower leg: Edema (1+ pitting) present.  ?   Left lower leg: Edema (1+ pitting) present.  ?Neurological:  ?   Mental Status: He is alert.  ? ?Assessment & Plan: ? ?1: Chronic heart failure with preserved ejection fraction without structural changes- ?- NYHA class III ?- minimally fluid overloaded with pitting edema and increased Heart Logic Score ?- will add furosemide '20mg'$  daily for the next 5 days with patient to call us on Monday to update on weight and swelling ?- check BMP next visit ?- not weighing daily but does have scales at home; instructed to weigh daily and call for an overnight weight gain of > 2 pounds or a weekly weight gain of > 5 pounds ?- not adding salt; low sodium cookbook was provided ?- drinks ~ 32 ounces of water along with ensure ?- BNP 09/15/21 was 630.9  ?- no tobacco use in the last week ? ?2: HTN- ?- BP mildly elevated (141/50); adding lasix per above ?- saw PCP (Tejan-Sie) 06/25/21 ?- BMP 09/22/21 reviewed and showed  sodium 138, potassium 3.7, creatinine 0.87 and GFR >60 ? ?3: Atrial fibrillation- ?- saw [facility] cardiology 07/01/21; returns June ?- on apixaban & metoprolol ? ?4: MSSA septicemia- ?- saw ID Juleen China) 09/29/21 ?- f

## 2021-10-06 DIAGNOSIS — L03113 Cellulitis of right upper limb: Secondary | ICD-10-CM | POA: Diagnosis not present

## 2021-10-06 DIAGNOSIS — A4101 Sepsis due to Methicillin susceptible Staphylococcus aureus: Secondary | ICD-10-CM | POA: Diagnosis not present

## 2021-10-07 ENCOUNTER — Other Ambulatory Visit: Payer: Self-pay

## 2021-10-07 ENCOUNTER — Ambulatory Visit: Payer: Medicare HMO | Attending: Family | Admitting: Family

## 2021-10-07 ENCOUNTER — Encounter: Payer: Self-pay | Admitting: Family

## 2021-10-07 VITALS — BP 141/50 | HR 77 | Resp 18 | Ht 72.0 in | Wt 178.1 lb

## 2021-10-07 DIAGNOSIS — I4891 Unspecified atrial fibrillation: Secondary | ICD-10-CM | POA: Diagnosis not present

## 2021-10-07 DIAGNOSIS — I10 Essential (primary) hypertension: Secondary | ICD-10-CM | POA: Diagnosis not present

## 2021-10-07 DIAGNOSIS — Z79899 Other long term (current) drug therapy: Secondary | ICD-10-CM | POA: Insufficient documentation

## 2021-10-07 DIAGNOSIS — I2510 Atherosclerotic heart disease of native coronary artery without angina pectoris: Secondary | ICD-10-CM | POA: Diagnosis not present

## 2021-10-07 DIAGNOSIS — I5032 Chronic diastolic (congestive) heart failure: Secondary | ICD-10-CM | POA: Diagnosis not present

## 2021-10-07 DIAGNOSIS — I480 Paroxysmal atrial fibrillation: Secondary | ICD-10-CM

## 2021-10-07 DIAGNOSIS — Z7901 Long term (current) use of anticoagulants: Secondary | ICD-10-CM | POA: Diagnosis not present

## 2021-10-07 DIAGNOSIS — A4101 Sepsis due to Methicillin susceptible Staphylococcus aureus: Secondary | ICD-10-CM

## 2021-10-07 DIAGNOSIS — I110 Hypertensive heart disease with heart failure: Secondary | ICD-10-CM | POA: Insufficient documentation

## 2021-10-07 MED ORDER — FUROSEMIDE 20 MG PO TABS: 20.0000 mg | ORAL_TABLET | Freq: Every day | ORAL | 3 refills | Status: DC

## 2021-10-07 NOTE — Patient Instructions (Addendum)
Begin weighing daily and call for an overnight weight gain of 3 pounds or more or a weekly weight gain of more than 5 pounds. ? ?If you have voicemail, please make sure your mailbox is cleaned out so that we may leave a message and please make sure to listen to any voicemails.  ? ? ? ?Begin taking furosemide (lasix) as 1 tablet every morning starting tomorrow morning. Call us on Monday with what your weight does from tomorrow through Monday so that we can determine if you need more furosemide.  ?

## 2021-10-11 ENCOUNTER — Telehealth: Payer: Self-pay

## 2021-10-11 NOTE — Telephone Encounter (Signed)
Called pt to notify him of his tunnel picc removal appt at [facility]  IR on 10/15/21 @ 9am and remind him  that his last dose of Iv abx is on 10/14/21. Pt verbalized understanding and had no further questions. ?Adelfa Koh, CMA ? ?

## 2021-10-11 NOTE — Telephone Encounter (Signed)
Talked to Marcie Bal [phone]) at [facility] specialty scheduling, to see if they have scheduled an appointment for pt to have tunnel picc pulled on 10/15/21 after last IV dose on  10/14/21. Marcie Bal stated that she sees the order but is not sure why it has not been scheduled yet. She stated that she will have the nurse view the order and see why the pt was not scheduled and she will call me back. ?Adelfa Koh, CMA ? ?

## 2021-10-13 DIAGNOSIS — L03113 Cellulitis of right upper limb: Secondary | ICD-10-CM | POA: Diagnosis not present

## 2021-10-13 DIAGNOSIS — A4101 Sepsis due to Methicillin susceptible Staphylococcus aureus: Secondary | ICD-10-CM | POA: Diagnosis not present

## 2021-10-13 DIAGNOSIS — B9561 Methicillin susceptible Staphylococcus aureus infection as the cause of diseases classified elsewhere: Secondary | ICD-10-CM | POA: Diagnosis not present

## 2021-10-14 NOTE — Progress Notes (Signed)
Spoke with patient 10/14/21 @ 12:55 pm. Reminded patient of appointment and asked patient to please arrive at 9:00 for 9:30 appointment. Patient verbalized understanding. ?

## 2021-10-15 ENCOUNTER — Ambulatory Visit
Admission: RE | Admit: 2021-10-15 | Discharge: 2021-10-15 | Disposition: A | Discharge status: Home / Self Care | Payer: Medicare HMO | Source: Ambulatory Visit | Attending: Internal Medicine | Admitting: Internal Medicine

## 2021-10-15 ENCOUNTER — Other Ambulatory Visit: Payer: Self-pay

## 2021-10-15 DIAGNOSIS — Z452 Encounter for adjustment and management of vascular access device: Secondary | ICD-10-CM | POA: Insufficient documentation

## 2021-10-15 DIAGNOSIS — A4101 Sepsis due to Methicillin susceptible Staphylococcus aureus: Secondary | ICD-10-CM | POA: Diagnosis not present

## 2021-10-15 HISTORY — PX: IR REMOVAL TUN CV CATH W/O FL: IMG2289

## 2021-10-15 MED ORDER — LIDOCAINE HCL 1 % IJ SOLN
INTRAMUSCULAR | Status: AC
  Filled 2021-10-15: qty 20

## 2021-10-18 DIAGNOSIS — I5042 Chronic combined systolic (congestive) and diastolic (congestive) heart failure: Secondary | ICD-10-CM | POA: Diagnosis not present

## 2021-10-19 DIAGNOSIS — N189 Chronic kidney disease, unspecified: Secondary | ICD-10-CM | POA: Diagnosis not present

## 2021-10-19 DIAGNOSIS — I10 Essential (primary) hypertension: Secondary | ICD-10-CM | POA: Diagnosis not present

## 2021-10-19 DIAGNOSIS — E559 Vitamin D deficiency, unspecified: Secondary | ICD-10-CM | POA: Diagnosis not present

## 2021-10-19 DIAGNOSIS — I739 Peripheral vascular disease, unspecified: Secondary | ICD-10-CM | POA: Diagnosis not present

## 2021-10-19 DIAGNOSIS — I808 Phlebitis and thrombophlebitis of other sites: Secondary | ICD-10-CM | POA: Diagnosis not present

## 2021-10-19 DIAGNOSIS — M109 Gout, unspecified: Secondary | ICD-10-CM | POA: Diagnosis not present

## 2021-10-19 DIAGNOSIS — R69 Illness, unspecified: Secondary | ICD-10-CM | POA: Diagnosis not present

## 2021-10-19 DIAGNOSIS — K219 Gastro-esophageal reflux disease without esophagitis: Secondary | ICD-10-CM | POA: Diagnosis not present

## 2021-10-19 DIAGNOSIS — M13861 Other specified arthritis, right knee: Secondary | ICD-10-CM | POA: Diagnosis not present

## 2021-10-19 NOTE — Progress Notes (Deleted)
? Patient ID: Duane Owens, male    DOB: 1941/05/07, 81 y.o.   MRN: [id number] ? ?HPI ? ?Duane Owens is a 81 y/o male with a history of atrial fibrillation, prostate cancer, CAD, hyperlipidemia, HTN, anemia, septicemia, spinal stenosis, tobacco use and chronic heart failure.  ? ?TEE report from 09/20/21 reviewed and showed an EF of 55-60% along with mild/moderate Duane/AR and no thrombus.  ? ?Admitted 09/15/21 due to septic shock secondary to MSSA septicemia and cellulitis of the right arm with superficial phlebitis. IV antibiotics started through PICC line. Cardiology and ID consults obtained. Elevated troponin thought to be due to shock. Entresto held due to soft BP. MRA held due to AKI. OT evaluation done. Discharged after 7 days. Admitted 09/09/21 due to dark stools, nausea and dizziness. Initially given IV PPI with transition to oral PPI. Head CT without acute findings. IVF given. HTN meds held due to hypotension. Discharged after 4 days.  ? ?He presents today for a follow-up visit with a chief complaint of  ? ?Past Medical History:  ?Diagnosis Date  ? Anemia   ? Arrhythmia   ? atrial fibrillation  ? Atherosclerosis   ? Cancer [facility])   ? prostate  ? CHF (congestive heart failure) ([place])   ? Coronary artery disease   ? Hypercholesteremia   ? Hyperlipemia   ? Hypertension   ? Septicemia ([place])   ? Spinal stenosis   ? Varicose vein   ? Venous insufficiency   ? ?Past Surgical History:  ?Procedure Laterality Date  ? BACK SURGERY    ? CYSTOSCOPY WITH DIRECT VISION INTERNAL URETHROTOMY N/A 02/02/2016  ? Procedure: CYSTOSCOPY WITH DIRECT VISION INTERNAL URETHROTOMY;  Surgeon: Royston Cowper, MD;  Location: [facility] ORS;  Service: Urology;  Laterality: N/A;  ? HOLMIUM LASER APPLICATION N/A 2/67/1245  ? Procedure: HOLMIUM LASER APPLICATION;  Surgeon: Royston Cowper, MD;  Location: [facility] ORS;  Service: Urology;  Laterality: N/A;  ? IR PERC TUN PERIT CATH WO PORT S&I /IMAG  09/22/2021  ? IR REMOVAL TUN CV CATH W/O FL  10/15/2021  ?  PERIPHERAL VASCULAR CATHETERIZATION N/A 08/11/2015  ? Procedure: Abdominal Aortogram w/Lower Extremity;  Surgeon: Katha Cabal, MD;  Location: [place] CV LAB;  Service: Cardiovascular;  Laterality: N/A;  ? PERIPHERAL VASCULAR CATHETERIZATION  08/11/2015  ? Procedure: Lower Extremity Intervention;  Surgeon: Katha Cabal, MD;  Location: [place] CV LAB;  Service: Cardiovascular;;  ? TEE WITHOUT CARDIOVERSION N/A 09/20/2021  ? Procedure: TRANSESOPHAGEAL ECHOCARDIOGRAM (TEE);  Surgeon: Kate Sable, MD;  Location: [facility] ORS;  Service: Cardiovascular;  Laterality: N/A;  ? TONSILLECTOMY    ? ?Family History  ?Problem Relation Age of Onset  ? Heart failure Mother   ? Diabetes Brother   ? COPD Brother   ? ?Social History  ? ?Tobacco Use  ? Smoking status: Former  ?  Packs/day: 0.50  ?  Years: 5.00  ?  Pack years: 2.50  ?  Types: Cigarettes  ? Smokeless tobacco: Never  ? Tobacco comments:  ?  Has called [place] [facility] and ordered nicotene patches. Will Quit when patches start  ?Substance Use Topics  ? Alcohol use: No  ? ?Allergies  ?Allergen Reactions  ? Amiodarone Other (See Comments)  ?  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  Would consider alternative agents if possible.  ? ? ? ? ?Review of Systems  ?Constitutional:  Positive for appetite change (fluctuates) and fatigue (easily).  ?HENT:  Positive for  hearing loss. Negative for rhinorrhea and sore throat.   ?Eyes: Negative.   ?Respiratory:  Positive for shortness of breath. Negative for cough.   ?Cardiovascular:  Negative for chest pain, palpitations and leg swelling.  ?Gastrointestinal:  Negative for abdominal distention and abdominal pain.  ?Endocrine: Negative.   ?Genitourinary: Negative.   ?Musculoskeletal:  Negative for back pain and neck pain.  ?Skin: Negative.   ?Allergic/Immunologic: Negative.   ?Neurological:  Negative for dizziness and light-headedness.  ?Hematological:  Negative for adenopathy.  Bruises/bleeds easily.  ?Psychiatric/Behavioral:  Negative for dysphoric mood and sleep disturbance (sleeping on 2 pillows). The patient is not nervous/anxious.   ? ? ? ? ?Physical Exam ?Vitals and nursing note reviewed.  ?Constitutional:   ?   Appearance: Normal appearance.  ?HENT:  ?   Head: Normocephalic and atraumatic.  ?   Right Ear: Decreased hearing noted.  ?   Left Ear: Decreased hearing noted.  ?Cardiovascular:  ?   Rate and Rhythm: Normal rate and regular rhythm.  ?Pulmonary:  ?   Effort: Pulmonary effort is normal. No respiratory distress.  ?   Breath sounds: No wheezing or rales.  ?Abdominal:  ?   General: There is no distension.  ?   Palpations: Abdomen is soft.  ?Musculoskeletal:     ?   General: No tenderness.  ?   Cervical back: Normal range of motion and neck supple.  ?   Right lower leg: Edema (1+ pitting) present.  ?   Left lower leg: Edema (1+ pitting) present.  ?Neurological:  ?   Mental Status: He is alert.  ? ?Assessment & Plan: ? ?1: Chronic heart failure with preserved ejection fraction without structural changes- ?- NYHA class III ?- minimally fluid overloaded with pitting edema and increased Heart Logic Score ?- not weighing daily but does have scales at home; instructed to weigh daily and call for an overnight weight gain of > 2 pounds or a weekly weight gain of > 5 pounds ?- weight 178.2 pounds from last visit here 2 weeks ago ?- not adding salt ?- drinks ~ 32 ounces of water along with ensure ?- BNP 09/15/21 was 630.9  ?- no tobacco use in the last week ? ?2: HTN- ?- BP  ?- saw PCP (Tejan-Sie) 06/25/21 ?- BMP 09/22/21 reviewed and showed sodium 138, potassium 3.7, creatinine 0.87 and GFR >60 ? ?3: Atrial fibrillation- ?- saw [facility] cardiology 07/01/21; returns June ?- on apixaban & metoprolol ? ?4: MSSA septicemia- ?- saw ID Juleen China) 09/29/21 ?- finishing out IV antibiotics with hopefully removal of catheter next week ? ? ?Patient did not bring his medications nor a list. Each medication was  verbally reviewed with the patient and he was encouraged to bring the bottles to every visit to confirm accuracy of list.  ? ? ?

## 2021-10-20 ENCOUNTER — Ambulatory Visit: Payer: Medicare HMO | Admitting: Family

## 2021-10-20 ENCOUNTER — Telehealth: Payer: Self-pay | Admitting: Family

## 2021-10-20 NOTE — Telephone Encounter (Signed)
Patient did not show for his [facility] appointment on 10/20/21. Will attempt to reschedule.   ?

## 2021-10-21 ENCOUNTER — Ambulatory Visit: Payer: Medicare HMO | Admitting: Family

## 2021-10-21 DIAGNOSIS — I480 Paroxysmal atrial fibrillation: Secondary | ICD-10-CM | POA: Diagnosis not present

## 2021-10-21 DIAGNOSIS — N179 Acute kidney failure, unspecified: Secondary | ICD-10-CM | POA: Diagnosis not present

## 2021-10-21 DIAGNOSIS — Z7901 Long term (current) use of anticoagulants: Secondary | ICD-10-CM | POA: Diagnosis not present

## 2021-10-21 DIAGNOSIS — M109 Gout, unspecified: Secondary | ICD-10-CM | POA: Diagnosis not present

## 2021-10-21 DIAGNOSIS — Z8673 Personal history of transient ischemic attack (TIA), and cerebral infarction without residual deficits: Secondary | ICD-10-CM | POA: Diagnosis not present

## 2021-10-21 DIAGNOSIS — Z79899 Other long term (current) drug therapy: Secondary | ICD-10-CM | POA: Diagnosis not present

## 2021-10-21 DIAGNOSIS — Z888 Allergy status to other drugs, medicaments and biological substances status: Secondary | ICD-10-CM | POA: Diagnosis not present

## 2021-10-21 DIAGNOSIS — I739 Peripheral vascular disease, unspecified: Secondary | ICD-10-CM | POA: Diagnosis not present

## 2021-10-21 DIAGNOSIS — F17200 Nicotine dependence, unspecified, uncomplicated: Secondary | ICD-10-CM | POA: Diagnosis not present

## 2021-10-21 DIAGNOSIS — R69 Illness, unspecified: Secondary | ICD-10-CM | POA: Diagnosis not present

## 2021-10-21 DIAGNOSIS — I110 Hypertensive heart disease with heart failure: Secondary | ICD-10-CM | POA: Diagnosis not present

## 2021-10-21 DIAGNOSIS — I5022 Chronic systolic (congestive) heart failure: Secondary | ICD-10-CM | POA: Diagnosis not present

## 2021-10-21 DIAGNOSIS — I10 Essential (primary) hypertension: Secondary | ICD-10-CM | POA: Diagnosis not present

## 2021-10-21 DIAGNOSIS — I255 Ischemic cardiomyopathy: Secondary | ICD-10-CM | POA: Diagnosis not present

## 2021-10-21 DIAGNOSIS — Z4502 Encounter for adjustment and management of automatic implantable cardiac defibrillator: Secondary | ICD-10-CM | POA: Diagnosis not present

## 2021-10-21 DIAGNOSIS — D649 Anemia, unspecified: Secondary | ICD-10-CM | POA: Diagnosis not present

## 2021-10-21 DIAGNOSIS — R9431 Abnormal electrocardiogram [ECG] [EKG]: Secondary | ICD-10-CM | POA: Diagnosis not present

## 2021-10-21 DIAGNOSIS — I4510 Unspecified right bundle-branch block: Secondary | ICD-10-CM | POA: Diagnosis not present

## 2021-10-21 DIAGNOSIS — Z951 Presence of aortocoronary bypass graft: Secondary | ICD-10-CM | POA: Diagnosis not present

## 2021-10-21 DIAGNOSIS — I491 Atrial premature depolarization: Secondary | ICD-10-CM | POA: Diagnosis not present

## 2021-10-21 DIAGNOSIS — I2510 Atherosclerotic heart disease of native coronary artery without angina pectoris: Secondary | ICD-10-CM | POA: Diagnosis not present

## 2021-10-21 DIAGNOSIS — Z87891 Personal history of nicotine dependence: Secondary | ICD-10-CM | POA: Diagnosis not present

## 2021-10-22 DIAGNOSIS — I480 Paroxysmal atrial fibrillation: Secondary | ICD-10-CM | POA: Diagnosis not present

## 2021-10-22 DIAGNOSIS — Z4502 Encounter for adjustment and management of automatic implantable cardiac defibrillator: Secondary | ICD-10-CM | POA: Diagnosis not present

## 2021-10-25 ENCOUNTER — Other Ambulatory Visit: Payer: Self-pay | Admitting: Family

## 2021-10-25 DIAGNOSIS — I739 Peripheral vascular disease, unspecified: Secondary | ICD-10-CM | POA: Diagnosis not present

## 2021-10-25 DIAGNOSIS — G8929 Other chronic pain: Secondary | ICD-10-CM | POA: Diagnosis not present

## 2021-10-25 DIAGNOSIS — E261 Secondary hyperaldosteronism: Secondary | ICD-10-CM | POA: Diagnosis not present

## 2021-10-25 DIAGNOSIS — E785 Hyperlipidemia, unspecified: Secondary | ICD-10-CM | POA: Diagnosis not present

## 2021-10-25 DIAGNOSIS — I25119 Atherosclerotic heart disease of native coronary artery with unspecified angina pectoris: Secondary | ICD-10-CM | POA: Diagnosis not present

## 2021-10-25 DIAGNOSIS — I252 Old myocardial infarction: Secondary | ICD-10-CM | POA: Diagnosis not present

## 2021-10-25 DIAGNOSIS — I4891 Unspecified atrial fibrillation: Secondary | ICD-10-CM | POA: Diagnosis not present

## 2021-10-25 DIAGNOSIS — I509 Heart failure, unspecified: Secondary | ICD-10-CM | POA: Diagnosis not present

## 2021-10-25 DIAGNOSIS — G4700 Insomnia, unspecified: Secondary | ICD-10-CM | POA: Diagnosis not present

## 2021-10-25 DIAGNOSIS — R69 Illness, unspecified: Secondary | ICD-10-CM | POA: Diagnosis not present

## 2021-10-25 DIAGNOSIS — Z008 Encounter for other general examination: Secondary | ICD-10-CM | POA: Diagnosis not present

## 2021-10-25 DIAGNOSIS — D6869 Other thrombophilia: Secondary | ICD-10-CM | POA: Diagnosis not present

## 2021-10-25 DIAGNOSIS — I69331 Monoplegia of upper limb following cerebral infarction affecting right dominant side: Secondary | ICD-10-CM | POA: Diagnosis not present

## 2021-10-26 DIAGNOSIS — Z4502 Encounter for adjustment and management of automatic implantable cardiac defibrillator: Secondary | ICD-10-CM | POA: Diagnosis not present

## 2021-11-15 DIAGNOSIS — H35363 Drusen (degenerative) of macula, bilateral: Secondary | ICD-10-CM | POA: Diagnosis not present

## 2021-11-15 DIAGNOSIS — H2513 Age-related nuclear cataract, bilateral: Secondary | ICD-10-CM | POA: Diagnosis not present

## 2021-11-15 DIAGNOSIS — H35033 Hypertensive retinopathy, bilateral: Secondary | ICD-10-CM | POA: Diagnosis not present

## 2021-11-15 DIAGNOSIS — H40013 Open angle with borderline findings, low risk, bilateral: Secondary | ICD-10-CM | POA: Diagnosis not present

## 2021-11-18 DIAGNOSIS — I5042 Chronic combined systolic (congestive) and diastolic (congestive) heart failure: Secondary | ICD-10-CM | POA: Diagnosis not present

## 2021-11-18 DIAGNOSIS — Z4502 Encounter for adjustment and management of automatic implantable cardiac defibrillator: Secondary | ICD-10-CM | POA: Diagnosis not present

## 2021-11-19 DIAGNOSIS — I255 Ischemic cardiomyopathy: Secondary | ICD-10-CM | POA: Diagnosis not present

## 2021-11-19 DIAGNOSIS — I5022 Chronic systolic (congestive) heart failure: Secondary | ICD-10-CM | POA: Diagnosis not present

## 2021-11-19 DIAGNOSIS — I10 Essential (primary) hypertension: Secondary | ICD-10-CM | POA: Diagnosis not present

## 2021-11-19 DIAGNOSIS — R944 Abnormal results of kidney function studies: Secondary | ICD-10-CM | POA: Diagnosis not present

## 2021-11-19 DIAGNOSIS — I2510 Atherosclerotic heart disease of native coronary artery without angina pectoris: Secondary | ICD-10-CM | POA: Diagnosis not present

## 2021-11-19 DIAGNOSIS — F17200 Nicotine dependence, unspecified, uncomplicated: Secondary | ICD-10-CM | POA: Diagnosis not present

## 2021-11-19 DIAGNOSIS — I480 Paroxysmal atrial fibrillation: Secondary | ICD-10-CM | POA: Diagnosis not present

## 2021-11-19 DIAGNOSIS — R69 Illness, unspecified: Secondary | ICD-10-CM | POA: Diagnosis not present

## 2021-11-19 DIAGNOSIS — D649 Anemia, unspecified: Secondary | ICD-10-CM | POA: Diagnosis not present

## 2021-12-23 DIAGNOSIS — I480 Paroxysmal atrial fibrillation: Secondary | ICD-10-CM | POA: Diagnosis not present

## 2021-12-23 DIAGNOSIS — I2510 Atherosclerotic heart disease of native coronary artery without angina pectoris: Secondary | ICD-10-CM | POA: Diagnosis not present

## 2021-12-23 DIAGNOSIS — I5022 Chronic systolic (congestive) heart failure: Secondary | ICD-10-CM | POA: Diagnosis not present

## 2021-12-23 DIAGNOSIS — F17200 Nicotine dependence, unspecified, uncomplicated: Secondary | ICD-10-CM | POA: Diagnosis not present

## 2021-12-23 DIAGNOSIS — R69 Illness, unspecified: Secondary | ICD-10-CM | POA: Diagnosis not present

## 2021-12-23 DIAGNOSIS — R944 Abnormal results of kidney function studies: Secondary | ICD-10-CM | POA: Diagnosis not present

## 2022-01-03 DIAGNOSIS — I5042 Chronic combined systolic (congestive) and diastolic (congestive) heart failure: Secondary | ICD-10-CM | POA: Diagnosis not present

## 2022-01-03 DIAGNOSIS — Z4502 Encounter for adjustment and management of automatic implantable cardiac defibrillator: Secondary | ICD-10-CM | POA: Diagnosis not present

## 2022-01-19 DIAGNOSIS — E559 Vitamin D deficiency, unspecified: Secondary | ICD-10-CM | POA: Diagnosis not present

## 2022-01-19 DIAGNOSIS — N189 Chronic kidney disease, unspecified: Secondary | ICD-10-CM | POA: Diagnosis not present

## 2022-01-19 DIAGNOSIS — E785 Hyperlipidemia, unspecified: Secondary | ICD-10-CM | POA: Diagnosis not present

## 2022-01-21 DIAGNOSIS — E559 Vitamin D deficiency, unspecified: Secondary | ICD-10-CM | POA: Diagnosis not present

## 2022-01-21 DIAGNOSIS — K219 Gastro-esophageal reflux disease without esophagitis: Secondary | ICD-10-CM | POA: Diagnosis not present

## 2022-01-21 DIAGNOSIS — I739 Peripheral vascular disease, unspecified: Secondary | ICD-10-CM | POA: Diagnosis not present

## 2022-01-21 DIAGNOSIS — I10 Essential (primary) hypertension: Secondary | ICD-10-CM | POA: Diagnosis not present

## 2022-01-21 DIAGNOSIS — F5101 Primary insomnia: Secondary | ICD-10-CM | POA: Diagnosis not present

## 2022-01-21 DIAGNOSIS — N189 Chronic kidney disease, unspecified: Secondary | ICD-10-CM | POA: Diagnosis not present

## 2022-01-21 DIAGNOSIS — E440 Moderate protein-calorie malnutrition: Secondary | ICD-10-CM | POA: Diagnosis not present

## 2022-01-21 DIAGNOSIS — M13861 Other specified arthritis, right knee: Secondary | ICD-10-CM | POA: Diagnosis not present

## 2022-01-21 DIAGNOSIS — F17200 Nicotine dependence, unspecified, uncomplicated: Secondary | ICD-10-CM | POA: Diagnosis not present

## 2022-01-21 DIAGNOSIS — R69 Illness, unspecified: Secondary | ICD-10-CM | POA: Diagnosis not present

## 2022-01-21 DIAGNOSIS — I808 Phlebitis and thrombophlebitis of other sites: Secondary | ICD-10-CM | POA: Diagnosis not present

## 2022-01-21 DIAGNOSIS — M109 Gout, unspecified: Secondary | ICD-10-CM | POA: Diagnosis not present

## 2022-01-26 DIAGNOSIS — Z4502 Encounter for adjustment and management of automatic implantable cardiac defibrillator: Secondary | ICD-10-CM | POA: Diagnosis not present

## 2022-02-07 DIAGNOSIS — I5042 Chronic combined systolic (congestive) and diastolic (congestive) heart failure: Secondary | ICD-10-CM | POA: Diagnosis not present

## 2022-02-14 DIAGNOSIS — I10 Essential (primary) hypertension: Secondary | ICD-10-CM | POA: Diagnosis not present

## 2022-02-14 DIAGNOSIS — E559 Vitamin D deficiency, unspecified: Secondary | ICD-10-CM | POA: Diagnosis not present

## 2022-02-14 DIAGNOSIS — M545 Low back pain, unspecified: Secondary | ICD-10-CM | POA: Diagnosis not present

## 2022-02-14 DIAGNOSIS — M13861 Other specified arthritis, right knee: Secondary | ICD-10-CM | POA: Diagnosis not present

## 2022-02-14 DIAGNOSIS — M109 Gout, unspecified: Secondary | ICD-10-CM | POA: Diagnosis not present

## 2022-02-14 DIAGNOSIS — R69 Illness, unspecified: Secondary | ICD-10-CM | POA: Diagnosis not present

## 2022-02-14 DIAGNOSIS — K219 Gastro-esophageal reflux disease without esophagitis: Secondary | ICD-10-CM | POA: Diagnosis not present

## 2022-02-14 DIAGNOSIS — N189 Chronic kidney disease, unspecified: Secondary | ICD-10-CM | POA: Diagnosis not present

## 2022-02-14 DIAGNOSIS — I808 Phlebitis and thrombophlebitis of other sites: Secondary | ICD-10-CM | POA: Diagnosis not present

## 2022-02-14 DIAGNOSIS — I739 Peripheral vascular disease, unspecified: Secondary | ICD-10-CM | POA: Diagnosis not present

## 2022-02-14 DIAGNOSIS — M5126 Other intervertebral disc displacement, lumbar region: Secondary | ICD-10-CM | POA: Diagnosis not present

## 2022-03-03 DIAGNOSIS — Z951 Presence of aortocoronary bypass graft: Secondary | ICD-10-CM | POA: Diagnosis not present

## 2022-03-03 DIAGNOSIS — M109 Gout, unspecified: Secondary | ICD-10-CM | POA: Diagnosis not present

## 2022-03-03 DIAGNOSIS — I5022 Chronic systolic (congestive) heart failure: Secondary | ICD-10-CM | POA: Diagnosis not present

## 2022-03-03 DIAGNOSIS — I4891 Unspecified atrial fibrillation: Secondary | ICD-10-CM | POA: Diagnosis not present

## 2022-03-03 DIAGNOSIS — R42 Dizziness and giddiness: Secondary | ICD-10-CM | POA: Diagnosis not present

## 2022-03-03 DIAGNOSIS — I255 Ischemic cardiomyopathy: Secondary | ICD-10-CM | POA: Diagnosis not present

## 2022-03-03 DIAGNOSIS — F17210 Nicotine dependence, cigarettes, uncomplicated: Secondary | ICD-10-CM | POA: Diagnosis not present

## 2022-03-03 DIAGNOSIS — Z95810 Presence of automatic (implantable) cardiac defibrillator: Secondary | ICD-10-CM | POA: Diagnosis not present

## 2022-03-03 DIAGNOSIS — I5020 Unspecified systolic (congestive) heart failure: Secondary | ICD-10-CM | POA: Diagnosis not present

## 2022-03-03 DIAGNOSIS — I480 Paroxysmal atrial fibrillation: Secondary | ICD-10-CM | POA: Diagnosis not present

## 2022-03-03 DIAGNOSIS — I739 Peripheral vascular disease, unspecified: Secondary | ICD-10-CM | POA: Diagnosis not present

## 2022-03-03 DIAGNOSIS — R69 Illness, unspecified: Secondary | ICD-10-CM | POA: Diagnosis not present

## 2022-03-03 DIAGNOSIS — Z4502 Encounter for adjustment and management of automatic implantable cardiac defibrillator: Secondary | ICD-10-CM | POA: Diagnosis not present

## 2022-03-03 DIAGNOSIS — I110 Hypertensive heart disease with heart failure: Secondary | ICD-10-CM | POA: Diagnosis not present

## 2022-03-03 DIAGNOSIS — Z7901 Long term (current) use of anticoagulants: Secondary | ICD-10-CM | POA: Diagnosis not present

## 2022-03-03 DIAGNOSIS — I2510 Atherosclerotic heart disease of native coronary artery without angina pectoris: Secondary | ICD-10-CM | POA: Diagnosis not present

## 2022-03-07 DIAGNOSIS — H25013 Cortical age-related cataract, bilateral: Secondary | ICD-10-CM | POA: Diagnosis not present

## 2022-03-07 DIAGNOSIS — H40013 Open angle with borderline findings, low risk, bilateral: Secondary | ICD-10-CM | POA: Diagnosis not present

## 2022-03-07 DIAGNOSIS — H524 Presbyopia: Secondary | ICD-10-CM | POA: Diagnosis not present

## 2022-03-07 DIAGNOSIS — H2511 Age-related nuclear cataract, right eye: Secondary | ICD-10-CM | POA: Diagnosis not present

## 2022-03-07 DIAGNOSIS — H2513 Age-related nuclear cataract, bilateral: Secondary | ICD-10-CM | POA: Diagnosis not present

## 2022-03-07 DIAGNOSIS — H52213 Irregular astigmatism, bilateral: Secondary | ICD-10-CM | POA: Diagnosis not present

## 2022-03-07 DIAGNOSIS — H3554 Dystrophies primarily involving the retinal pigment epithelium: Secondary | ICD-10-CM | POA: Diagnosis not present

## 2022-03-10 ENCOUNTER — Encounter (INDEPENDENT_AMBULATORY_CARE_PROVIDER_SITE_OTHER): Payer: Medicare HMO | Admitting: Ophthalmology

## 2022-03-10 DIAGNOSIS — H353132 Nonexudative age-related macular degeneration, bilateral, intermediate dry stage: Secondary | ICD-10-CM

## 2022-03-10 DIAGNOSIS — H43813 Vitreous degeneration, bilateral: Secondary | ICD-10-CM | POA: Diagnosis not present

## 2022-03-14 DIAGNOSIS — I5042 Chronic combined systolic (congestive) and diastolic (congestive) heart failure: Secondary | ICD-10-CM | POA: Diagnosis not present

## 2022-03-14 DIAGNOSIS — Z4509 Encounter for adjustment and management of other cardiac device: Secondary | ICD-10-CM | POA: Diagnosis not present

## 2022-03-16 DIAGNOSIS — M47817 Spondylosis without myelopathy or radiculopathy, lumbosacral region: Secondary | ICD-10-CM | POA: Diagnosis not present

## 2022-03-16 DIAGNOSIS — M545 Low back pain, unspecified: Secondary | ICD-10-CM | POA: Diagnosis not present

## 2022-03-22 DIAGNOSIS — H25011 Cortical age-related cataract, right eye: Secondary | ICD-10-CM | POA: Diagnosis not present

## 2022-03-22 DIAGNOSIS — H2511 Age-related nuclear cataract, right eye: Secondary | ICD-10-CM | POA: Diagnosis not present

## 2022-04-01 DIAGNOSIS — M47816 Spondylosis without myelopathy or radiculopathy, lumbar region: Secondary | ICD-10-CM | POA: Diagnosis not present

## 2022-04-04 DIAGNOSIS — H2511 Age-related nuclear cataract, right eye: Secondary | ICD-10-CM | POA: Diagnosis not present

## 2022-04-14 ENCOUNTER — Telehealth: Payer: Self-pay | Admitting: *Deleted

## 2022-04-14 NOTE — Patient Outreach (Signed)
  Care Coordination   04/25/2022 Name: Duane Owens MRN: [id number] DOB: 02-02-1941   Care Coordination Outreach Attempts:  An unsuccessful telephone outreach was attempted today to offer the patient information about available care coordination services as a benefit of their health plan.   Follow Up Plan:  Additional outreach attempts will be made to offer the patient care coordination information and services.   Encounter Outcome:  No Answer  Care Coordination Interventions Activated:  No   Care Coordination Interventions:  No, not indicated    SIG Huie Ghuman C. Myrtie Neither, MSN, [facility] Gerontological Nurse Practitioner [facility] Care Management [phone]

## 2022-04-18 DIAGNOSIS — I5042 Chronic combined systolic (congestive) and diastolic (congestive) heart failure: Secondary | ICD-10-CM | POA: Diagnosis not present

## 2022-04-19 DIAGNOSIS — Z79891 Long term (current) use of opiate analgesic: Secondary | ICD-10-CM | POA: Diagnosis not present

## 2022-04-19 DIAGNOSIS — M545 Low back pain, unspecified: Secondary | ICD-10-CM | POA: Diagnosis not present

## 2022-04-19 DIAGNOSIS — M47817 Spondylosis without myelopathy or radiculopathy, lumbosacral region: Secondary | ICD-10-CM | POA: Diagnosis not present

## 2022-04-25 DIAGNOSIS — H2512 Age-related nuclear cataract, left eye: Secondary | ICD-10-CM | POA: Diagnosis not present

## 2022-04-25 DIAGNOSIS — H25012 Cortical age-related cataract, left eye: Secondary | ICD-10-CM | POA: Diagnosis not present

## 2022-04-26 DIAGNOSIS — N189 Chronic kidney disease, unspecified: Secondary | ICD-10-CM | POA: Diagnosis not present

## 2022-04-26 DIAGNOSIS — Z4502 Encounter for adjustment and management of automatic implantable cardiac defibrillator: Secondary | ICD-10-CM | POA: Diagnosis not present

## 2022-04-26 DIAGNOSIS — Z7901 Long term (current) use of anticoagulants: Secondary | ICD-10-CM | POA: Diagnosis not present

## 2022-04-26 DIAGNOSIS — I739 Peripheral vascular disease, unspecified: Secondary | ICD-10-CM | POA: Diagnosis not present

## 2022-04-26 DIAGNOSIS — I4891 Unspecified atrial fibrillation: Secondary | ICD-10-CM | POA: Diagnosis not present

## 2022-04-29 DIAGNOSIS — N189 Chronic kidney disease, unspecified: Secondary | ICD-10-CM | POA: Diagnosis not present

## 2022-04-29 DIAGNOSIS — Z23 Encounter for immunization: Secondary | ICD-10-CM | POA: Diagnosis not present

## 2022-04-29 DIAGNOSIS — M109 Gout, unspecified: Secondary | ICD-10-CM | POA: Diagnosis not present

## 2022-04-29 DIAGNOSIS — I739 Peripheral vascular disease, unspecified: Secondary | ICD-10-CM | POA: Diagnosis not present

## 2022-04-29 DIAGNOSIS — R69 Illness, unspecified: Secondary | ICD-10-CM | POA: Diagnosis not present

## 2022-04-29 DIAGNOSIS — F17210 Nicotine dependence, cigarettes, uncomplicated: Secondary | ICD-10-CM | POA: Diagnosis not present

## 2022-04-29 DIAGNOSIS — E559 Vitamin D deficiency, unspecified: Secondary | ICD-10-CM | POA: Diagnosis not present

## 2022-04-29 DIAGNOSIS — F5101 Primary insomnia: Secondary | ICD-10-CM | POA: Diagnosis not present

## 2022-04-29 DIAGNOSIS — I10 Essential (primary) hypertension: Secondary | ICD-10-CM | POA: Diagnosis not present

## 2022-04-29 DIAGNOSIS — K219 Gastro-esophageal reflux disease without esophagitis: Secondary | ICD-10-CM | POA: Diagnosis not present

## 2022-04-29 DIAGNOSIS — I808 Phlebitis and thrombophlebitis of other sites: Secondary | ICD-10-CM | POA: Diagnosis not present

## 2022-04-29 DIAGNOSIS — M13861 Other specified arthritis, right knee: Secondary | ICD-10-CM | POA: Diagnosis not present

## 2022-05-03 ENCOUNTER — Telehealth: Payer: Self-pay | Admitting: *Deleted

## 2022-05-03 NOTE — Patient Outreach (Signed)
  Care Coordination   Initial Visit Note   05/03/2022 Name: Duane Owens MRN: [id number] DOB: 07/04/1941  Duane Owens is a 81 y.o. year old male who sees [name], MD for primary care. I spoke with  Joanne R Fossum by phone today.  What matters to the patients health and wellness today?  I do not have any care management needs at this time. NP sent contact information via text.    Goals Addressed   None     SDOH assessments and interventions completed:  Yes  SDOH Interventions Today    Flowsheet Row Most Recent Value  SDOH Interventions   Food Insecurity Interventions Intervention Not Indicated  Housing Interventions Intervention Not Indicated  Transportation Interventions Intervention Not Indicated        Care Coordination Interventions Activated:  No  Care Coordination Interventions:  No, not indicated   Follow up plan: No further intervention required.   Encounter Outcome:  Pt. Refused   Allona Gondek C. Myrtie Neither, MSN, [facility] Gerontological Nurse Practitioner [facility] Care Management [phone]

## 2022-05-17 DIAGNOSIS — M545 Low back pain, unspecified: Secondary | ICD-10-CM | POA: Diagnosis not present

## 2022-05-17 DIAGNOSIS — M47817 Spondylosis without myelopathy or radiculopathy, lumbosacral region: Secondary | ICD-10-CM | POA: Diagnosis not present

## 2022-05-17 DIAGNOSIS — Z79891 Long term (current) use of opiate analgesic: Secondary | ICD-10-CM | POA: Diagnosis not present

## 2022-05-19 DIAGNOSIS — Z4502 Encounter for adjustment and management of automatic implantable cardiac defibrillator: Secondary | ICD-10-CM | POA: Diagnosis not present

## 2022-05-19 DIAGNOSIS — Z4509 Encounter for adjustment and management of other cardiac device: Secondary | ICD-10-CM | POA: Diagnosis not present

## 2022-06-21 DIAGNOSIS — Z79891 Long term (current) use of opiate analgesic: Secondary | ICD-10-CM | POA: Diagnosis not present

## 2022-06-21 DIAGNOSIS — M47817 Spondylosis without myelopathy or radiculopathy, lumbosacral region: Secondary | ICD-10-CM | POA: Diagnosis not present

## 2022-06-21 DIAGNOSIS — I5042 Chronic combined systolic (congestive) and diastolic (congestive) heart failure: Secondary | ICD-10-CM | POA: Diagnosis not present

## 2022-06-21 DIAGNOSIS — M545 Low back pain, unspecified: Secondary | ICD-10-CM | POA: Diagnosis not present

## 2022-06-23 DIAGNOSIS — R634 Abnormal weight loss: Secondary | ICD-10-CM | POA: Diagnosis not present

## 2022-06-23 DIAGNOSIS — R591 Generalized enlarged lymph nodes: Secondary | ICD-10-CM | POA: Diagnosis not present

## 2022-06-23 DIAGNOSIS — I77810 Thoracic aortic ectasia: Secondary | ICD-10-CM | POA: Diagnosis not present

## 2022-06-23 DIAGNOSIS — R69 Illness, unspecified: Secondary | ICD-10-CM | POA: Diagnosis not present

## 2022-06-28 DIAGNOSIS — H25812 Combined forms of age-related cataract, left eye: Secondary | ICD-10-CM | POA: Diagnosis not present

## 2022-06-28 DIAGNOSIS — H2512 Age-related nuclear cataract, left eye: Secondary | ICD-10-CM | POA: Diagnosis not present

## 2022-06-28 DIAGNOSIS — H25012 Cortical age-related cataract, left eye: Secondary | ICD-10-CM | POA: Diagnosis not present

## 2022-07-08 DIAGNOSIS — H2512 Age-related nuclear cataract, left eye: Secondary | ICD-10-CM | POA: Diagnosis not present

## 2022-07-10 DIAGNOSIS — R591 Generalized enlarged lymph nodes: Secondary | ICD-10-CM | POA: Diagnosis not present

## 2022-07-10 DIAGNOSIS — J90 Pleural effusion, not elsewhere classified: Secondary | ICD-10-CM | POA: Diagnosis not present

## 2022-07-10 DIAGNOSIS — R590 Localized enlarged lymph nodes: Secondary | ICD-10-CM | POA: Diagnosis not present

## 2022-07-10 DIAGNOSIS — I77810 Thoracic aortic ectasia: Secondary | ICD-10-CM | POA: Diagnosis not present

## 2022-07-21 DIAGNOSIS — M545 Low back pain, unspecified: Secondary | ICD-10-CM | POA: Diagnosis not present

## 2022-07-21 DIAGNOSIS — Z79891 Long term (current) use of opiate analgesic: Secondary | ICD-10-CM | POA: Diagnosis not present

## 2022-07-21 DIAGNOSIS — M47817 Spondylosis without myelopathy or radiculopathy, lumbosacral region: Secondary | ICD-10-CM | POA: Diagnosis not present

## 2022-07-22 DIAGNOSIS — Z0100 Encounter for examination of eyes and vision without abnormal findings: Secondary | ICD-10-CM | POA: Diagnosis not present

## 2022-07-26 DIAGNOSIS — Z95810 Presence of automatic (implantable) cardiac defibrillator: Secondary | ICD-10-CM | POA: Diagnosis not present

## 2022-07-26 DIAGNOSIS — Z4502 Encounter for adjustment and management of automatic implantable cardiac defibrillator: Secondary | ICD-10-CM | POA: Diagnosis not present

## 2022-07-26 DIAGNOSIS — I5042 Chronic combined systolic (congestive) and diastolic (congestive) heart failure: Secondary | ICD-10-CM | POA: Diagnosis not present

## 2022-07-28 DIAGNOSIS — I10 Essential (primary) hypertension: Secondary | ICD-10-CM | POA: Diagnosis not present

## 2022-07-28 DIAGNOSIS — I739 Peripheral vascular disease, unspecified: Secondary | ICD-10-CM | POA: Diagnosis not present

## 2022-07-28 DIAGNOSIS — E559 Vitamin D deficiency, unspecified: Secondary | ICD-10-CM | POA: Diagnosis not present

## 2022-08-02 DIAGNOSIS — I10 Essential (primary) hypertension: Secondary | ICD-10-CM | POA: Diagnosis not present

## 2022-08-02 DIAGNOSIS — Z1331 Encounter for screening for depression: Secondary | ICD-10-CM | POA: Diagnosis not present

## 2022-08-02 DIAGNOSIS — R4181 Age-related cognitive decline: Secondary | ICD-10-CM | POA: Diagnosis not present

## 2022-08-02 DIAGNOSIS — Z739 Problem related to life management difficulty, unspecified: Secondary | ICD-10-CM | POA: Diagnosis not present

## 2022-08-02 DIAGNOSIS — Z0001 Encounter for general adult medical examination with abnormal findings: Secondary | ICD-10-CM | POA: Diagnosis not present

## 2022-08-02 DIAGNOSIS — K219 Gastro-esophageal reflux disease without esophagitis: Secondary | ICD-10-CM | POA: Diagnosis not present

## 2022-08-02 DIAGNOSIS — F17200 Nicotine dependence, unspecified, uncomplicated: Secondary | ICD-10-CM | POA: Diagnosis not present

## 2022-08-02 DIAGNOSIS — M109 Gout, unspecified: Secondary | ICD-10-CM | POA: Diagnosis not present

## 2022-08-02 DIAGNOSIS — M13861 Other specified arthritis, right knee: Secondary | ICD-10-CM | POA: Diagnosis not present

## 2022-08-02 DIAGNOSIS — N189 Chronic kidney disease, unspecified: Secondary | ICD-10-CM | POA: Diagnosis not present

## 2022-08-02 DIAGNOSIS — R69 Illness, unspecified: Secondary | ICD-10-CM | POA: Diagnosis not present

## 2022-08-02 DIAGNOSIS — I739 Peripheral vascular disease, unspecified: Secondary | ICD-10-CM | POA: Diagnosis not present

## 2022-08-18 DIAGNOSIS — M545 Low back pain, unspecified: Secondary | ICD-10-CM | POA: Diagnosis not present

## 2022-08-18 DIAGNOSIS — Z79891 Long term (current) use of opiate analgesic: Secondary | ICD-10-CM | POA: Diagnosis not present

## 2022-08-18 DIAGNOSIS — M47817 Spondylosis without myelopathy or radiculopathy, lumbosacral region: Secondary | ICD-10-CM | POA: Diagnosis not present

## 2022-08-26 DIAGNOSIS — I5042 Chronic combined systolic (congestive) and diastolic (congestive) heart failure: Secondary | ICD-10-CM | POA: Diagnosis not present

## 2022-08-26 DIAGNOSIS — Z4502 Encounter for adjustment and management of automatic implantable cardiac defibrillator: Secondary | ICD-10-CM | POA: Diagnosis not present

## 2022-09-15 DIAGNOSIS — M47817 Spondylosis without myelopathy or radiculopathy, lumbosacral region: Secondary | ICD-10-CM | POA: Diagnosis not present

## 2022-09-15 DIAGNOSIS — Z79891 Long term (current) use of opiate analgesic: Secondary | ICD-10-CM | POA: Diagnosis not present

## 2022-09-15 DIAGNOSIS — M545 Low back pain, unspecified: Secondary | ICD-10-CM | POA: Diagnosis not present

## 2022-09-26 DIAGNOSIS — Z4502 Encounter for adjustment and management of automatic implantable cardiac defibrillator: Secondary | ICD-10-CM | POA: Diagnosis not present

## 2022-09-26 DIAGNOSIS — I5042 Chronic combined systolic (congestive) and diastolic (congestive) heart failure: Secondary | ICD-10-CM | POA: Diagnosis not present

## 2022-10-25 DIAGNOSIS — Z95810 Presence of automatic (implantable) cardiac defibrillator: Secondary | ICD-10-CM | POA: Diagnosis not present

## 2022-10-27 ENCOUNTER — Other Ambulatory Visit: Payer: Medicare HMO

## 2022-10-27 ENCOUNTER — Other Ambulatory Visit: Payer: Self-pay | Admitting: Internal Medicine

## 2022-10-27 DIAGNOSIS — E559 Vitamin D deficiency, unspecified: Secondary | ICD-10-CM | POA: Diagnosis not present

## 2022-10-27 DIAGNOSIS — I5042 Chronic combined systolic (congestive) and diastolic (congestive) heart failure: Secondary | ICD-10-CM | POA: Diagnosis not present

## 2022-10-27 DIAGNOSIS — I739 Peripheral vascular disease, unspecified: Secondary | ICD-10-CM | POA: Diagnosis not present

## 2022-10-27 DIAGNOSIS — N189 Chronic kidney disease, unspecified: Secondary | ICD-10-CM | POA: Diagnosis not present

## 2022-10-27 DIAGNOSIS — Z4502 Encounter for adjustment and management of automatic implantable cardiac defibrillator: Secondary | ICD-10-CM | POA: Diagnosis not present

## 2022-10-28 LAB — LIPID PANEL W/O CHOL/HDL RATIO
Cholesterol, Total: 98 mg/dL — ABNORMAL LOW (ref 100–199)
HDL: 27 mg/dL — ABNORMAL LOW (ref >39)
LDL Chol Calc (NIH): 46 mg/dL (ref 0–99)
Triglycerides: 139 mg/dL (ref 0–149)
VLDL Cholesterol Cal: 25 mg/dL (ref 5–40)

## 2022-10-28 LAB — COMPREHENSIVE METABOLIC PANEL
ALT: 15 IU/L (ref 0–44)
AST: 17 IU/L (ref 0–40)
Albumin/Globulin Ratio: 1.4 (ref 1.2–2.2)
Albumin: 3.7 g/dL (ref 3.7–4.7)
Alkaline Phosphatase: 186 IU/L — ABNORMAL HIGH (ref 44–121)
BUN/Creatinine Ratio: 18 (ref 10–24)
BUN: 18 mg/dL (ref 8–27)
Bilirubin Total: 0.3 mg/dL (ref 0.0–1.2)
CO2: 18 mmol/L — ABNORMAL LOW (ref 20–29)
Calcium: 8.9 mg/dL (ref 8.6–10.2)
Chloride: 109 mmol/L — ABNORMAL HIGH (ref 96–106)
Creatinine, Ser: 1.02 mg/dL (ref 0.76–1.27)
Globulin, Total: 2.7 g/dL (ref 1.5–4.5)
Glucose: 111 mg/dL — ABNORMAL HIGH (ref 70–99)
Potassium: 4.1 mmol/L (ref 3.5–5.2)
Sodium: 141 mmol/L (ref 134–144)
Total Protein: 6.4 g/dL (ref 6.0–8.5)
eGFR: 73 mL/min/{1.73_m2} (ref >59)

## 2022-10-28 LAB — VITAMIN D 25 HYDROXY (VIT D DEFICIENCY, FRACTURES): Vit D, 25-Hydroxy: 22.9 ng/mL — ABNORMAL LOW (ref 30.0–100.0)

## 2022-11-01 ENCOUNTER — Ambulatory Visit: Payer: Medicare HMO | Admitting: Internal Medicine

## 2022-11-02 ENCOUNTER — Other Ambulatory Visit: Payer: Self-pay | Admitting: Internal Medicine

## 2022-11-03 MED ORDER — MIRTAZAPINE 15 MG PO TABS: 15.0000 mg | ORAL_TABLET | Freq: Every day | ORAL | 3 refills | Status: DC

## 2022-11-09 DIAGNOSIS — M47817 Spondylosis without myelopathy or radiculopathy, lumbosacral region: Secondary | ICD-10-CM | POA: Diagnosis not present

## 2022-11-09 DIAGNOSIS — M545 Low back pain, unspecified: Secondary | ICD-10-CM | POA: Diagnosis not present

## 2022-11-09 DIAGNOSIS — Z79891 Long term (current) use of opiate analgesic: Secondary | ICD-10-CM | POA: Diagnosis not present

## 2022-12-07 DIAGNOSIS — M545 Low back pain, unspecified: Secondary | ICD-10-CM | POA: Diagnosis not present

## 2022-12-07 DIAGNOSIS — Z79891 Long term (current) use of opiate analgesic: Secondary | ICD-10-CM | POA: Diagnosis not present

## 2022-12-07 DIAGNOSIS — M47817 Spondylosis without myelopathy or radiculopathy, lumbosacral region: Secondary | ICD-10-CM | POA: Diagnosis not present

## 2022-12-09 ENCOUNTER — Ambulatory Visit (INDEPENDENT_AMBULATORY_CARE_PROVIDER_SITE_OTHER): Payer: Medicare HMO | Admitting: Internal Medicine

## 2022-12-09 ENCOUNTER — Encounter: Payer: Self-pay | Admitting: Internal Medicine

## 2022-12-09 VITALS — BP 130/66 | HR 63 | Ht 72.0 in | Wt 165.0 lb

## 2022-12-09 DIAGNOSIS — G8929 Other chronic pain: Secondary | ICD-10-CM

## 2022-12-09 DIAGNOSIS — M48062 Spinal stenosis, lumbar region with neurogenic claudication: Secondary | ICD-10-CM | POA: Diagnosis not present

## 2022-12-09 DIAGNOSIS — I10 Essential (primary) hypertension: Secondary | ICD-10-CM | POA: Diagnosis not present

## 2022-12-09 DIAGNOSIS — E559 Vitamin D deficiency, unspecified: Secondary | ICD-10-CM

## 2022-12-09 DIAGNOSIS — J8410 Pulmonary fibrosis, unspecified: Secondary | ICD-10-CM

## 2022-12-09 DIAGNOSIS — M5441 Lumbago with sciatica, right side: Secondary | ICD-10-CM | POA: Diagnosis not present

## 2022-12-09 DIAGNOSIS — M5442 Lumbago with sciatica, left side: Secondary | ICD-10-CM | POA: Diagnosis not present

## 2022-12-09 DIAGNOSIS — E782 Mixed hyperlipidemia: Secondary | ICD-10-CM | POA: Diagnosis not present

## 2022-12-09 MED ORDER — VITAMIN D (CHOLECALCIFEROL) 25 MCG (1000 UT) PO CAPS
1.0000 | ORAL_CAPSULE | Freq: Every day | ORAL | 1 refills | Status: DC
Start: 2023-01-09 — End: 2023-03-10

## 2022-12-09 MED ORDER — VITAMIN D (ERGOCALCIFEROL) 1.25 MG (50000 UNIT) PO CAPS
50000.0000 [IU] | ORAL_CAPSULE | ORAL | 0 refills | Status: AC
Start: 2022-12-09 — End: 2022-12-31

## 2022-12-09 MED ORDER — ATORVASTATIN CALCIUM 20 MG PO TABS
20.0000 mg | ORAL_TABLET | Freq: Every evening | ORAL | 0 refills | Status: DC
Start: 2022-12-09 — End: 2023-03-10

## 2022-12-09 NOTE — Progress Notes (Signed)
Reviewed at appt.

## 2022-12-09 NOTE — Progress Notes (Signed)
Established Patient Office Visit  Subjective:  Patient ID: Duane Owens, male    DOB: 02/01/1941  Age: 82 y.o. MRN: [id number]  Chief Complaint  Patient presents with   Follow-up    3 month follow up    No new complaints, here for lab review and medication refills. Labs reviewed and notable for well controlled lipids, Non-AG acidosis on BMP with normal renal function. Denies any recent chest pain but admits to occasional SOB.    No other concerns at this time.   Past Medical History:  Diagnosis Date   Anemia    Arrhythmia    atrial fibrillation   Atherosclerosis    Cancer [facility])    prostate   CHF (congestive heart failure) (HCC)    Coronary artery disease    Hypercholesteremia    Hyperlipemia    Hypertension    Septicemia (HCC)    Spinal stenosis    Varicose vein    Venous insufficiency     Past Surgical History:  Procedure Laterality Date   BACK SURGERY     CYSTOSCOPY WITH DIRECT VISION INTERNAL URETHROTOMY N/A 02/02/2016   Procedure: CYSTOSCOPY WITH DIRECT VISION INTERNAL URETHROTOMY;  Surgeon: Orson Ape, MD;  Location: [facility] ORS;  Service: Urology;  Laterality: N/A;   HOLMIUM LASER APPLICATION N/A 02/02/2016   Procedure: HOLMIUM LASER APPLICATION;  Surgeon: Orson Ape, MD;  Location: [facility] ORS;  Service: Urology;  Laterality: N/A;   IR PERC TUN PERIT CATH WO PORT Chares Slaymaker&I /IMAG  09/22/2021   IR REMOVAL TUN CV CATH W/O FL  10/15/2021   PERIPHERAL VASCULAR CATHETERIZATION N/A 08/11/2015   Procedure: Abdominal Aortogram w/Lower Extremity;  Surgeon: Renford Dills, MD;  Location: [facility] INVASIVE CV LAB;  Service: Cardiovascular;  Laterality: N/A;   PERIPHERAL VASCULAR CATHETERIZATION  08/11/2015   Procedure: Lower Extremity Intervention;  Surgeon: Renford Dills, MD;  Location: [facility] CV LAB;  Service: Cardiovascular;;   TEE WITHOUT CARDIOVERSION N/A 09/20/2021   Procedure: TRANSESOPHAGEAL ECHOCARDIOGRAM (TEE);  Surgeon: Debbe Odea, MD;  Location:  [facility] ORS;  Service: Cardiovascular;  Laterality: N/A;   TONSILLECTOMY      Social History   Socioeconomic History   Marital status: Widowed    Spouse name: Not on file   Number of children: Not on file   Years of education: Not on file   Highest education level: Not on file  Occupational History   Not on file  Tobacco Use   Smoking status: Former    Packs/day: 0.50    Years: 5.00    Additional pack years: 0.00    Total pack years: 2.50    Types: Cigarettes   Smokeless tobacco: Never   Tobacco comments:    Has called [facility] and ordered nicotene patches. Will Quit when patches start  Vaping Use   Vaping Use: Never used  Substance and Sexual Activity   Alcohol use: No   Drug use: No   Sexual activity: Not on file  Other Topics Concern   Not on file  Social History Narrative   Not on file   Social Determinants of Health   Financial Resource Strain: Not on file  Food Insecurity: No Food Insecurity (05/03/2022)   Hunger Vital Sign    Worried About Running Out of Food in the Last Year: Never true    Ran Out of Food in the Last Year: Never true  Transportation Needs: No Transportation Needs (05/03/2022)   PRAPARE - Transportation  Lack of Transportation (Medical): No    Lack of Transportation (Non-Medical): No  Physical Activity: Not on file  Stress: Not on file  Social Connections: Not on file  Intimate Partner Violence: Not on file    Family History  Problem Relation Age of Onset   Heart failure Mother    Diabetes Brother    COPD Brother     Allergies  Allergen Reactions   Amiodarone Other (See Comments)    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  Would consider alternative agents if possible.    Review of Systems  Constitutional: Negative.   HENT: Negative.    Eyes: Negative.   Respiratory:  Positive for shortness of breath.   Cardiovascular: Negative.   Gastrointestinal: Negative.   Genitourinary:  Negative.   Skin: Negative.   Neurological: Negative.   Endo/Heme/Allergies: Negative.        Objective:   BP 130/66   Pulse 63   Ht 6' (1.829 m)   Wt 165 lb (74.8 kg)   SpO2 97%   BMI 22.38 kg/m   Vitals:   12/09/22 1037  BP: 130/66  Pulse: 63  Height: 6' (1.829 m)  Weight: 165 lb (74.8 kg)  SpO2: 97%  BMI (Calculated): 22.37    Physical Exam Vitals reviewed.  Constitutional:      Appearance: Normal appearance.  HENT:     Head: Normocephalic.     Left Ear: There is no impacted cerumen.     Nose: Nose normal.     Mouth/Throat:     Mouth: Mucous membranes are moist.     Pharynx: No posterior oropharyngeal erythema.  Eyes:     Extraocular Movements: Extraocular movements intact.     Pupils: Pupils are equal, round, and reactive to light.  Cardiovascular:     Rate and Rhythm: Regular rhythm.     Chest Wall: PMI is not displaced.     Pulses: Normal pulses.     Heart sounds: Normal heart sounds. No murmur heard. Pulmonary:     Effort: Pulmonary effort is normal.     Breath sounds: Normal air entry. No rhonchi or rales.  Abdominal:     General: Abdomen is flat. Bowel sounds are normal. There is no distension.     Palpations: Abdomen is soft. There is no hepatomegaly, splenomegaly or mass.     Tenderness: There is no abdominal tenderness.  Musculoskeletal:        General: Normal range of motion.     Cervical back: Normal range of motion and neck supple.     Right lower leg: No edema.     Left lower leg: No edema.  Skin:    General: Skin is warm and dry.  Neurological:     General: No focal deficit present.     Mental Status: He is alert and oriented to person, place, and time.     Cranial Nerves: No cranial nerve deficit.     Motor: No weakness.  Psychiatric:        Mood and Affect: Mood normal.        Behavior: Behavior normal.      No results found for any visits on 12/09/22.  Recent Results (from the past 2160 hour(Melea Prezioso))  Comprehensive metabolic  panel     Status: Abnormal   Collection Time: 10/27/22 10:39 AM  Result Value Ref Range   Glucose 111 (H) 70 - 99 mg/dL   BUN 18 8 - 27 mg/dL   Creatinine,  Ser 1.02 0.76 - 1.27 mg/dL   eGFR 73 >16 XW/RUE/4.54   BUN/Creatinine Ratio 18 10 - 24   Sodium 141 134 - 144 mmol/L   Potassium 4.1 3.5 - 5.2 mmol/L   Chloride 109 (H) 96 - 106 mmol/L   CO2 18 (L) 20 - 29 mmol/L   Calcium 8.9 8.6 - 10.2 mg/dL   Total Protein 6.4 6.0 - 8.5 g/dL   Albumin 3.7 3.7 - 4.7 g/dL   Globulin, Total 2.7 1.5 - 4.5 g/dL   Albumin/Globulin Ratio 1.4 1.2 - 2.2   Bilirubin Total 0.3 0.0 - 1.2 mg/dL   Alkaline Phosphatase 186 (H) 44 - 121 IU/L   AST 17 0 - 40 IU/L   ALT 15 0 - 44 IU/L  Lipid Panel w/o Chol/HDL Ratio     Status: Abnormal   Collection Time: 10/27/22 10:39 AM  Result Value Ref Range   Cholesterol, Total 98 (L) 100 - 199 mg/dL   Triglycerides 098 0 - 149 mg/dL   HDL 27 (L) >11 mg/dL   VLDL Cholesterol Cal 25 5 - 40 mg/dL   LDL Chol Calc (NIH) 46 0 - 99 mg/dL  VITAMIN D 25 Hydroxy (Vit-D Deficiency, Fractures)     Status: Abnormal   Collection Time: 10/27/22 10:39 AM  Result Value Ref Range   Vit D, 25-Hydroxy 22.9 (L) 30.0 - 100.0 ng/mL    Comment: Vitamin D deficiency has been defined by the Institute of Medicine and an Endocrine Society practice guideline as a level of serum 25-OH vitamin D less than 20 ng/mL (1,2). The Endocrine Society went on to further define vitamin D insufficiency as a level between 21 and 29 ng/mL (2). 1. IOM (Institute of Medicine). 2010. Dietary reference    intakes for calcium and D. [place]: The    [employer]. 2. Holick MF, Binkley [place], Bischoff-Ferrari HA, et al.    Evaluation, treatment, and prevention of vitamin D    deficiency: an Endocrine Society clinical practice    guideline. JCEM. 2011 Jul; 96(7):1911-30.       Assessment & Plan:   Problem List Items Addressed This Visit   None   No follow-ups on file.   Total time  spent: 20 minutes  Luna Fuse, MD  12/09/2022   This document may have been prepared by [facility] Voice Recognition software and as such may include unintentional dictation errors.

## 2022-12-22 DIAGNOSIS — R634 Abnormal weight loss: Secondary | ICD-10-CM | POA: Diagnosis not present

## 2022-12-22 DIAGNOSIS — F17200 Nicotine dependence, unspecified, uncomplicated: Secondary | ICD-10-CM | POA: Diagnosis not present

## 2022-12-22 DIAGNOSIS — R918 Other nonspecific abnormal finding of lung field: Secondary | ICD-10-CM | POA: Diagnosis not present

## 2023-01-02 DIAGNOSIS — I5042 Chronic combined systolic (congestive) and diastolic (congestive) heart failure: Secondary | ICD-10-CM | POA: Diagnosis not present

## 2023-01-05 DIAGNOSIS — M47817 Spondylosis without myelopathy or radiculopathy, lumbosacral region: Secondary | ICD-10-CM | POA: Diagnosis not present

## 2023-01-05 DIAGNOSIS — M545 Low back pain, unspecified: Secondary | ICD-10-CM | POA: Diagnosis not present

## 2023-01-05 DIAGNOSIS — Z79891 Long term (current) use of opiate analgesic: Secondary | ICD-10-CM | POA: Diagnosis not present

## 2023-01-18 ENCOUNTER — Other Ambulatory Visit: Payer: Self-pay | Admitting: Internal Medicine

## 2023-01-24 DIAGNOSIS — Z95810 Presence of automatic (implantable) cardiac defibrillator: Secondary | ICD-10-CM | POA: Diagnosis not present

## 2023-02-01 DIAGNOSIS — M545 Low back pain, unspecified: Secondary | ICD-10-CM | POA: Diagnosis not present

## 2023-02-01 DIAGNOSIS — M47817 Spondylosis without myelopathy or radiculopathy, lumbosacral region: Secondary | ICD-10-CM | POA: Diagnosis not present

## 2023-02-01 DIAGNOSIS — Z79891 Long term (current) use of opiate analgesic: Secondary | ICD-10-CM | POA: Diagnosis not present

## 2023-02-02 DIAGNOSIS — I5042 Chronic combined systolic (congestive) and diastolic (congestive) heart failure: Secondary | ICD-10-CM | POA: Diagnosis not present

## 2023-02-13 DIAGNOSIS — M13851 Other specified arthritis, right hip: Secondary | ICD-10-CM | POA: Diagnosis not present

## 2023-02-13 DIAGNOSIS — M533 Sacrococcygeal disorders, not elsewhere classified: Secondary | ICD-10-CM | POA: Diagnosis not present

## 2023-02-13 DIAGNOSIS — M545 Low back pain, unspecified: Secondary | ICD-10-CM | POA: Diagnosis not present

## 2023-02-13 DIAGNOSIS — M47816 Spondylosis without myelopathy or radiculopathy, lumbar region: Secondary | ICD-10-CM | POA: Diagnosis not present

## 2023-02-13 DIAGNOSIS — M47896 Other spondylosis, lumbar region: Secondary | ICD-10-CM | POA: Diagnosis not present

## 2023-02-20 ENCOUNTER — Encounter: Payer: Self-pay | Admitting: Internal Medicine

## 2023-02-20 ENCOUNTER — Ambulatory Visit (INDEPENDENT_AMBULATORY_CARE_PROVIDER_SITE_OTHER): Payer: Medicare HMO | Admitting: Internal Medicine

## 2023-02-20 VITALS — BP 130/55 | HR 61 | Ht 72.0 in | Wt 161.4 lb

## 2023-02-20 DIAGNOSIS — M1A0790 Idiopathic chronic gout, unspecified ankle and foot, without tophus (tophi): Secondary | ICD-10-CM | POA: Diagnosis not present

## 2023-02-20 DIAGNOSIS — I739 Peripheral vascular disease, unspecified: Secondary | ICD-10-CM

## 2023-02-20 DIAGNOSIS — E782 Mixed hyperlipidemia: Secondary | ICD-10-CM

## 2023-02-20 DIAGNOSIS — M48062 Spinal stenosis, lumbar region with neurogenic claudication: Secondary | ICD-10-CM | POA: Diagnosis not present

## 2023-02-20 MED ORDER — COLCHICINE 0.6 MG PO TABS
0.6000 mg | ORAL_TABLET | Freq: Every day | ORAL | 0 refills | Status: DC
Start: 2023-02-20 — End: 2023-06-28

## 2023-02-20 NOTE — Progress Notes (Signed)
Established Patient Office Visit  Subjective:  Patient ID: Duane Owens, male    DOB: 1941-05-05  Age: 82 y.o. MRN: [id number]  Chief Complaint  Patient presents with   Acute Visit    Pain right thigh & discuss referral    C/o right leg pain x 1 wk that radiates from his groin exacerbated by ambulation. Similar to ischemic symptoms he experienced prior to stent placement for PAD.    No other concerns at this time.   Past Medical History:  Diagnosis Date   Anemia    Arrhythmia    atrial fibrillation   Atherosclerosis    Cancer [facility])    prostate   CHF (congestive heart failure) (HCC)    Coronary artery disease    Hypercholesteremia    Hyperlipemia    Hypertension    Septicemia (HCC)    Spinal stenosis    Varicose vein    Venous insufficiency     Past Surgical History:  Procedure Laterality Date   BACK SURGERY     CYSTOSCOPY WITH DIRECT VISION INTERNAL URETHROTOMY N/A 02/02/2016   Procedure: CYSTOSCOPY WITH DIRECT VISION INTERNAL URETHROTOMY;  Surgeon: Orson Ape, MD;  Location: [facility] ORS;  Service: Urology;  Laterality: N/A;   HOLMIUM LASER APPLICATION N/A 02/02/2016   Procedure: HOLMIUM LASER APPLICATION;  Surgeon: Orson Ape, MD;  Location: [facility] OR[facility];  Service: Urology;  Laterality: N/A;   IR PERC TUN PERIT CATH WO PORT Eutimio Gharibian&I /IMAG  09/22/2021   IR REMOVAL TUN CV CATH W/O FL  10/15/2021   PERIPHERAL VASCULAR CATHETERIZATION N/A 08/11/2015   Procedure: Abdominal Aortogram w/Lower Extremity;  Surgeon: Renford Dills, MD;  Location: [facility];  Service: Cardiovascular;  Laterality: N/A;   PERIPHERAL VASCULAR CATHETERIZATION  08/11/2015   Procedure: Lower Extremity Intervention;  Surgeon: Renford Dills, MD;  Location: [facility];  Service: Cardiovascular;;   TEE WITHOUT CARDIOVERSION N/A 09/20/2021   Procedure: TRANSESOPHAGEAL ECHOCARDIOGRAM (TEE);  Surgeon: Debbe Odea, MD;  Location: [facility] OR[facility];  Service: Cardiovascular;   Laterality: N/A;   TONSILLECTOMY      Social History   Socioeconomic History   Marital status: Widowed    Spouse name: Not on file   Number of children: Not on file   Years of education: Not on file   Highest education level: Not on file  Occupational History   Not on file  Tobacco Use   Smoking status: Former    Current packs/day: 0.50    Average packs/day: 0.5 packs/day for 5.0 years (2.5 ttl pk-yrs)    Types: Cigarettes   Smokeless tobacco: Never   Tobacco comments:    Has called [facility] and ordered nicotene patches. Will Quit when patches start  Vaping Use   Vaping status: Never Used  Substance and Sexual Activity   Alcohol use: No   Drug use: No   Sexual activity: Not on file  Other Topics Concern   Not on file  Social History Narrative   Not on file   Social Determinants of Health   Financial Resource Strain: Not on file  Food Insecurity: No Food Insecurity (05/03/2022)   Hunger Vital Sign    Worried About Running Out of Food in the Last Year: Never true    Ran Out of Food in the Last Year: Never true  Transportation Needs: No Transportation Needs (05/03/2022)   PRAPARE - Transportation    Lack of Transportation (Medical): No    Lack of  Transportation (Non-Medical): No  Physical Activity: Not on file  Stress: Not on file  Social Connections: Not on file  Intimate Partner Violence: Not on file    Family History  Problem Relation Age of Onset   Heart failure Mother    Diabetes Brother    COPD Brother     Allergies  Allergen Reactions   Amiodarone Other (See Comments)    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  Would consider alternative agents if possible.    Review of Systems  All other systems reviewed and are negative.      Objective:   BP (!) 130/55   Pulse 61   Ht 6' (1.829 m)   Wt 161 lb 6.4 oz (73.2 kg)   SpO2 98%   BMI 21.89 kg/m   Vitals:   02/20/23 1251  BP: (!) 130/55  Pulse: 61   Height: 6' (1.829 m)  Weight: 161 lb 6.4 oz (73.2 kg)  SpO2: 98%  BMI (Calculated): 21.89    Physical Exam Vitals reviewed.  Constitutional:      Appearance: Normal appearance.  HENT:     Head: Normocephalic.     Left Ear: There is no impacted cerumen.     Nose: Nose normal.     Mouth/Throat:     Mouth: Mucous membranes are moist.     Pharynx: No posterior oropharyngeal erythema.  Eyes:     Extraocular Movements: Extraocular movements intact.     Pupils: Pupils are equal, round, and reactive to light.  Cardiovascular:     Rate and Rhythm: Regular rhythm.     Chest Wall: PMI is not displaced.     Pulses: Normal pulses.     Heart sounds: Normal heart sounds. No murmur heard. Pulmonary:     Effort: Pulmonary effort is normal.     Breath sounds: Normal air entry. No rhonchi or rales.  Abdominal:     General: Abdomen is flat. Bowel sounds are normal. There is no distension.     Palpations: Abdomen is soft. There is no hepatomegaly, splenomegaly or mass.     Tenderness: There is no abdominal tenderness.  Musculoskeletal:        General: Normal range of motion.     Cervical back: Normal range of motion and neck supple.     Right lower leg: No edema.     Left lower leg: No edema.  Skin:    General: Skin is warm and dry.  Neurological:     General: No focal deficit present.     Mental Status: He is alert and oriented to person, place, and time.     Cranial Nerves: No cranial nerve deficit.     Motor: No weakness.  Psychiatric:        Mood and Affect: Mood normal.        Behavior: Behavior normal.      No results found for any visits on 02/20/23.  No results found for this or any previous visit (from the past 2160 hour(Raiyah Speakman)).    Assessment & Plan:  As per problem list.  Problem List Items Addressed This Visit       Cardiovascular and Mediastinum   PAD (peripheral artery disease) (HCC) - Primary   Relevant Orders   Ambulatory referral to Vascular Surgery      Other   Lumbar central spinal stenosis (severe at L2-3 and L3-4) (Chronic)   Mixed hyperlipidemia    Return keep appt.   Total time  spent: 20 minutes  Luna Fuse, MD  02/20/2023   This document may have been prepared by [facility] Voice Recognition software and as such may include unintentional dictation errors.

## 2023-02-21 DIAGNOSIS — H903 Sensorineural hearing loss, bilateral: Secondary | ICD-10-CM | POA: Diagnosis not present

## 2023-02-21 DIAGNOSIS — H6123 Impacted cerumen, bilateral: Secondary | ICD-10-CM | POA: Diagnosis not present

## 2023-03-06 ENCOUNTER — Other Ambulatory Visit: Payer: Medicare HMO

## 2023-03-06 DIAGNOSIS — I10 Essential (primary) hypertension: Secondary | ICD-10-CM

## 2023-03-06 DIAGNOSIS — I5042 Chronic combined systolic (congestive) and diastolic (congestive) heart failure: Secondary | ICD-10-CM | POA: Diagnosis not present

## 2023-03-06 DIAGNOSIS — E782 Mixed hyperlipidemia: Secondary | ICD-10-CM | POA: Diagnosis not present

## 2023-03-06 DIAGNOSIS — E559 Vitamin D deficiency, unspecified: Secondary | ICD-10-CM | POA: Diagnosis not present

## 2023-03-08 DIAGNOSIS — M545 Low back pain, unspecified: Secondary | ICD-10-CM | POA: Diagnosis not present

## 2023-03-08 DIAGNOSIS — Z79891 Long term (current) use of opiate analgesic: Secondary | ICD-10-CM | POA: Diagnosis not present

## 2023-03-08 DIAGNOSIS — M47817 Spondylosis without myelopathy or radiculopathy, lumbosacral region: Secondary | ICD-10-CM | POA: Diagnosis not present

## 2023-03-10 ENCOUNTER — Encounter: Payer: Self-pay | Admitting: Internal Medicine

## 2023-03-10 ENCOUNTER — Ambulatory Visit (INDEPENDENT_AMBULATORY_CARE_PROVIDER_SITE_OTHER): Payer: Medicare HMO | Admitting: Internal Medicine

## 2023-03-10 VITALS — BP 112/50 | HR 70 | Ht 72.0 in | Wt 160.0 lb

## 2023-03-10 DIAGNOSIS — Z8546 Personal history of malignant neoplasm of prostate: Secondary | ICD-10-CM | POA: Diagnosis not present

## 2023-03-10 DIAGNOSIS — Z1331 Encounter for screening for depression: Secondary | ICD-10-CM | POA: Diagnosis not present

## 2023-03-10 DIAGNOSIS — F17209 Nicotine dependence, unspecified, with unspecified nicotine-induced disorders: Secondary | ICD-10-CM

## 2023-03-10 DIAGNOSIS — E559 Vitamin D deficiency, unspecified: Secondary | ICD-10-CM | POA: Diagnosis not present

## 2023-03-10 DIAGNOSIS — E782 Mixed hyperlipidemia: Secondary | ICD-10-CM

## 2023-03-10 DIAGNOSIS — I739 Peripheral vascular disease, unspecified: Secondary | ICD-10-CM | POA: Diagnosis not present

## 2023-03-10 DIAGNOSIS — I10 Essential (primary) hypertension: Secondary | ICD-10-CM

## 2023-03-10 DIAGNOSIS — J8410 Pulmonary fibrosis, unspecified: Secondary | ICD-10-CM | POA: Diagnosis not present

## 2023-03-10 MED ORDER — VITAMIN D (CHOLECALCIFEROL) 25 MCG (1000 UT) PO CAPS
1.0000 | ORAL_CAPSULE | Freq: Every day | ORAL | 1 refills | Status: DC
Start: 2023-03-10 — End: 2023-09-20

## 2023-03-10 MED ORDER — ATORVASTATIN CALCIUM 20 MG PO TABS
20.0000 mg | ORAL_TABLET | Freq: Every evening | ORAL | 0 refills | Status: DC
Start: 2023-03-10 — End: 2023-06-09

## 2023-03-10 NOTE — Progress Notes (Signed)
Established Patient Office Visit  Subjective:  Patient ID: Duane Owens, male    DOB: Jul 08, 1941  Age: 82 y.o. MRN: [id number]  Chief Complaint  Patient presents with   Follow-up    No new complaints, here for AWV refer to quality metrics and scanned documents.  LDL and TC well controlled on lab review. Triglycerides also satisfactory. CMP notable for decrease in GFR with normal Cr. CBC notable for stable anemia. Concerned about recurrence of his prostate ca but hasn't followed up with his urologist who recently closed his practice.   No other concerns at this time.   Past Medical History:  Diagnosis Date   Anemia    Arrhythmia    atrial fibrillation   Atherosclerosis    Cancer [facility])    prostate   CHF (congestive heart failure) (HCC)    Coronary artery disease    Hypercholesteremia    Hyperlipemia    Hypertension    Septicemia (HCC)    Spinal stenosis    Varicose vein    Venous insufficiency     Past Surgical History:  Procedure Laterality Date   BACK SURGERY     CYSTOSCOPY WITH DIRECT VISION INTERNAL URETHROTOMY N/A 02/02/2016   Procedure: CYSTOSCOPY WITH DIRECT VISION INTERNAL URETHROTOMY;  Surgeon: Orson Ape, MD;  Location: [facility] OR[facility];  Service: Urology;  Laterality: N/A;   HOLMIUM LASER APPLICATION N/A 02/02/2016   Procedure: HOLMIUM LASER APPLICATION;  Surgeon: Orson Ape, MD;  Location: [facility] OR[facility];  Service: Urology;  Laterality: N/A;   IR PERC TUN PERIT CATH WO PORT Abbee Cremeens&I /IMAG  09/22/2021   IR REMOVAL TUN CV CATH W/O FL  10/15/2021   PERIPHERAL VASCULAR CATHETERIZATION N/A 08/11/2015   Procedure: Abdominal Aortogram w/Lower Extremity;  Surgeon: Renford Dills, MD;  Location: [facility];  Service: Cardiovascular;  Laterality: N/A;   PERIPHERAL VASCULAR CATHETERIZATION  08/11/2015   Procedure: Lower Extremity Intervention;  Surgeon: Renford Dills, MD;  Location: [facility];  Service: Cardiovascular;;   TEE WITHOUT CARDIOVERSION N/A  09/20/2021   Procedure: TRANSESOPHAGEAL ECHOCARDIOGRAM (TEE);  Surgeon: Debbe Odea, MD;  Location: [facility] OR[facility];  Service: Cardiovascular;  Laterality: N/A;   TONSILLECTOMY      Social History   Socioeconomic History   Marital status: Widowed    Spouse name: Not on file   Number of children: Not on file   Years of education: Not on file   Highest education level: Not on file  Occupational History   Not on file  Tobacco Use   Smoking status: Former    Current packs/day: 0.50    Average packs/day: 0.5 packs/day for 5.0 years (2.5 ttl pk-yrs)    Types: Cigarettes   Smokeless tobacco: Never   Tobacco comments:    Has called [facility] and ordered nicotene patches. Will Quit when patches start  Vaping Use   Vaping status: Never Used  Substance and Sexual Activity   Alcohol use: No   Drug use: No   Sexual activity: Not on file  Other Topics Concern   Not on file  Social History Narrative   Not on file   Social Determinants of Health   Financial Resource Strain: Not on file  Food Insecurity: No Food Insecurity (05/03/2022)   Hunger Vital Sign    Worried About Running Out of Food in the Last Year: Never true    Ran Out of Food in the Last Year: Never true  Transportation Needs: No Transportation  Needs (05/03/2022)   PRAPARE - Administrator, Civil Service (Medical): No    Lack of Transportation (Non-Medical): No  Physical Activity: Not on file  Stress: Not on file  Social Connections: Not on file  Intimate Partner Violence: Not on file    Family History  Problem Relation Age of Onset   Heart failure Mother    Diabetes Brother    COPD Brother     Allergies  Allergen Reactions   Amiodarone Other (See Comments)    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  Would consider alternative agents if possible.    Review of Systems  Constitutional: Negative.   HENT: Negative.    Eyes: Negative.   Respiratory:  Positive  for shortness of breath.   Cardiovascular: Negative.   Gastrointestinal: Negative.   Genitourinary: Negative.   Skin: Negative.   Neurological: Negative.   Endo/Heme/Allergies: Negative.       Objective:   BP (!) 112/50   Pulse 70   Ht 6' (1.829 m)   Wt 160 lb (72.6 kg)   SpO2 97%   BMI 21.70 kg/m   Vitals:   03/10/23 0917  BP: (!) 112/50  Pulse: 70  Height: 6' (1.829 m)  Weight: 160 lb (72.6 kg)  SpO2: 97%  BMI (Calculated): 21.7    Physical Exam Vitals reviewed.  Constitutional:      Appearance: Normal appearance.  HENT:     Head: Normocephalic.     Left Ear: There is no impacted cerumen.     Nose: Nose normal.     Mouth/Throat:     Mouth: Mucous membranes are moist.     Pharynx: No posterior oropharyngeal erythema.  Eyes:     Extraocular Movements: Extraocular movements intact.     Pupils: Pupils are equal, round, and reactive to light.  Cardiovascular:     Rate and Rhythm: Regular rhythm.     Chest Wall: PMI is not displaced.     Pulses: Normal pulses.     Heart sounds: Normal heart sounds. No murmur heard. Pulmonary:     Effort: Pulmonary effort is normal.     Breath sounds: Normal air entry. No rhonchi or rales.  Abdominal:     General: Abdomen is flat. Bowel sounds are normal. There is no distension.     Palpations: Abdomen is soft. There is no hepatomegaly, splenomegaly or mass.     Tenderness: There is no abdominal tenderness.  Musculoskeletal:        General: Normal range of motion.     Cervical back: Normal range of motion and neck supple.     Right lower leg: No edema.     Left lower leg: No edema.     Comments: Wearing lumbar brace  Skin:    General: Skin is warm and dry.  Neurological:     General: No focal deficit present.     Mental Status: He is alert and oriented to person, place, and time.     Cranial Nerves: No cranial nerve deficit.     Motor: No weakness.  Psychiatric:        Mood and Affect: Mood normal.        Behavior:  Behavior normal.      No results found for any visits on 03/10/23.  Recent Results (from the past 2160 hour(Mando Blatz))  Vitamin D (25 hydroxy)     Status: None   Collection Time: 03/06/23 11:36 AM  Result Value Ref Range  Vit D, 25-Hydroxy 34.1 30.0 - 100.0 ng/mL    Comment: Vitamin D deficiency has been defined by the Institute of Medicine and an Endocrine Society practice guideline as a level of serum 25-OH vitamin D less than 20 ng/mL (1,2). The Endocrine Society went on to further define vitamin D insufficiency as a level between 21 and 29 ng/mL (2). 1. IOM (Institute of Medicine). 2010. Dietary reference    intakes for calcium and D. [place]: The    [employer]. 2. Holick MF, Binkley [place], Bischoff-Ferrari HA, et al.    Evaluation, treatment, and prevention of vitamin D    deficiency: an Endocrine Society clinical practice    guideline. JCEM. 2011 Jul; 96(7):1911-30.   Lipid panel     Status: Abnormal   Collection Time: 03/06/23 11:36 AM  Result Value Ref Range   Cholesterol, Total 94 (L) 100 - 199 mg/dL   Triglycerides 782 0 - 149 mg/dL   HDL 28 (L) >95 mg/dL   VLDL Cholesterol Cal 23 5 - 40 mg/dL   LDL Chol Calc (NIH) 43 0 - 99 mg/dL   Chol/HDL Ratio 3.4 0.0 - 5.0 ratio    Comment:                                   T. Chol/HDL Ratio                                             Men  Women                               1/2 Avg.Risk  3.4    3.3                                   Avg.Risk  5.0    4.4                                2X Avg.Risk  9.6    7.1                                3X Avg.Risk 23.4   11.0   Comprehensive metabolic panel     Status: Abnormal   Collection Time: 03/06/23 11:36 AM  Result Value Ref Range   Glucose 108 (H) 70 - 99 mg/dL   BUN 23 8 - 27 mg/dL   Creatinine, Ser 6.21 0.76 - 1.27 mg/dL   eGFR 57 (L) >30 QM/VHQ/4.69   BUN/Creatinine Ratio 18 10 - 24   Sodium 139 134 - 144 mmol/L   Potassium 4.7 3.5 - 5.2 mmol/L   Chloride 108  (H) 96 - 106 mmol/L   CO2 18 (L) 20 - 29 mmol/L   Calcium 9.3 8.6 - 10.2 mg/dL   Total Protein 6.8 6.0 - 8.5 g/dL   Albumin 4.0 3.7 - 4.7 g/dL   Globulin, Total 2.8 1.5 - 4.5 g/dL   Bilirubin Total 0.2 0.0 - 1.2 mg/dL   Alkaline Phosphatase 172 (H) 44 - 121 IU/L   AST 17 0 -  40 IU/L   ALT 21 0 - 44 IU/L  CBC With Diff/Platelet     Status: Abnormal   Collection Time: 03/06/23 11:36 AM  Result Value Ref Range   WBC 9.0 3.4 - 10.8 x10E3/uL   RBC 3.66 (L) 4.14 - 5.80 x10E6/uL   Hemoglobin 11.1 (L) 13.0 - 17.7 g/dL   Hematocrit 03.4 (L) 74.2 - 51.0 %   MCV 95 79 - 97 fL   MCH 30.3 26.6 - 33.0 pg   MCHC 32.0 31.5 - 35.7 g/dL   RDW 59.5 (H) 63.8 - 75.6 %   Platelets 239 150 - 450 x10E3/uL   Neutrophils 63 Not Estab. %   Lymphs 26 Not Estab. %   Monocytes 9 Not Estab. %   Eos 1 Not Estab. %   Basos 1 Not Estab. %   Neutrophils Absolute 5.7 1.4 - 7.0 x10E3/uL   Lymphocytes Absolute 2.4 0.7 - 3.1 x10E3/uL   Monocytes Absolute 0.8 0.1 - 0.9 x10E3/uL   EOS (ABSOLUTE) 0.1 0.0 - 0.4 x10E3/uL   Basophils Absolute 0.1 0.0 - 0.2 x10E3/uL   Immature Granulocytes 0 Not Estab. %   Immature Grans (Abs) 0.0 0.0 - 0.1 x10E3/uL      Assessment & Plan:  As per problem list  Problem List Items Addressed This Visit       Cardiovascular and Mediastinum   Essential hypertension, benign - Primary   Relevant Medications   atorvastatin (LIPITOR) 20 MG tablet   PAD (peripheral artery disease) (HCC)   Relevant Medications   atorvastatin (LIPITOR) 20 MG tablet   Other Relevant Orders   Lipid panel     Respiratory   Pulmonary fibrosis (HCC)     Other   Mixed hyperlipidemia   Relevant Medications   atorvastatin (LIPITOR) 20 MG tablet   Other Relevant Orders   Lipid panel   Comprehensive metabolic panel   CK   History of prostate cancer   Relevant Orders   Ambulatory referral to Urology   Vitamin D deficiency   Relevant Medications   Vitamin D, Cholecalciferol, 25 MCG (1000 UT) CAPS     Return in about 3 months (around 06/10/2023) for fu with labs prior.   Total time spent: 40 minutes  Luna Fuse, MD  03/10/2023   This document may have been prepared by [facility] Voice Recognition software and as such may include unintentional dictation errors.

## 2023-03-13 DIAGNOSIS — H903 Sensorineural hearing loss, bilateral: Secondary | ICD-10-CM | POA: Diagnosis not present

## 2023-03-15 ENCOUNTER — Ambulatory Visit (INDEPENDENT_AMBULATORY_CARE_PROVIDER_SITE_OTHER): Payer: Medicare HMO

## 2023-03-15 DIAGNOSIS — I739 Peripheral vascular disease, unspecified: Secondary | ICD-10-CM | POA: Diagnosis not present

## 2023-03-23 NOTE — Progress Notes (Signed)
VM full

## 2023-03-28 DIAGNOSIS — H903 Sensorineural hearing loss, bilateral: Secondary | ICD-10-CM | POA: Diagnosis not present

## 2023-04-05 DIAGNOSIS — Z79891 Long term (current) use of opiate analgesic: Secondary | ICD-10-CM | POA: Diagnosis not present

## 2023-04-05 DIAGNOSIS — M47817 Spondylosis without myelopathy or radiculopathy, lumbosacral region: Secondary | ICD-10-CM | POA: Diagnosis not present

## 2023-04-05 DIAGNOSIS — M545 Low back pain, unspecified: Secondary | ICD-10-CM | POA: Diagnosis not present

## 2023-04-06 DIAGNOSIS — I5042 Chronic combined systolic (congestive) and diastolic (congestive) heart failure: Secondary | ICD-10-CM | POA: Diagnosis not present

## 2023-04-13 ENCOUNTER — Encounter: Payer: Self-pay | Admitting: Urology

## 2023-04-13 ENCOUNTER — Ambulatory Visit: Payer: Medicare HMO | Admitting: Urology

## 2023-04-13 VITALS — BP 120/73 | HR 79 | Ht 72.0 in | Wt 160.0 lb

## 2023-04-13 DIAGNOSIS — R972 Elevated prostate specific antigen [PSA]: Secondary | ICD-10-CM | POA: Diagnosis not present

## 2023-04-13 DIAGNOSIS — N401 Enlarged prostate with lower urinary tract symptoms: Secondary | ICD-10-CM

## 2023-04-13 DIAGNOSIS — Z87448 Personal history of other diseases of urinary system: Secondary | ICD-10-CM | POA: Diagnosis not present

## 2023-04-13 DIAGNOSIS — Z8546 Personal history of malignant neoplasm of prostate: Secondary | ICD-10-CM

## 2023-04-13 DIAGNOSIS — R39198 Other difficulties with micturition: Secondary | ICD-10-CM

## 2023-04-13 DIAGNOSIS — Z08 Encounter for follow-up examination after completed treatment for malignant neoplasm: Secondary | ICD-10-CM

## 2023-04-13 LAB — BLADDER SCAN AMB NON-IMAGING: Scan Result: 0

## 2023-04-13 NOTE — Progress Notes (Signed)
I,Amy L Pierron,acting as a scribe for Duane Altes, MD.,have documented all relevant documentation on the behalf of Duane Altes, MD,as directed by  Duane Altes, MD while in the presence of Duane Altes, MD.  04/13/2023 2:02 PM   Duane Owens 09/26/1940 [id number]  Referring provider: Sherron Monday, MD [address] [place],  [place] [address]  Chief Complaint  Patient presents with   Prostate Cancer    HPI: Duane Owens is a 82 y.o. male referred to establish uroologic care.  Previously followed by Dr. Evelene Croon for prostate cancer, and states he was last seen approximately two years ago. His office records were not available. On chart review, he was diagnosed with Gleason 4+4 adenocarcinoma of the prostate and underwent I-125 brachytherapy in 2011. He underwent internal urethrotomy for urethral stricture in July 2017. He has some frequency, urgency, and a slow urinary stream. Denies dysuria or gross hematuria.   PMH: Past Medical History:  Diagnosis Date   Anemia    Arrhythmia    atrial fibrillation   Atherosclerosis    Cancer [facility])    prostate   CHF (congestive heart failure) (HCC)    Coronary artery disease    Hypercholesteremia    Hyperlipemia    Hypertension    Septicemia (HCC)    Spinal stenosis    Varicose vein    Venous insufficiency     Surgical History: Past Surgical History:  Procedure Laterality Date   BACK SURGERY     CYSTOSCOPY WITH DIRECT VISION INTERNAL URETHROTOMY N/A 02/02/2016   Procedure: CYSTOSCOPY WITH DIRECT VISION INTERNAL URETHROTOMY;  Surgeon: Orson Ape, MD;  Location: [facility] ORS;  Service: Urology;  Laterality: N/A;   HOLMIUM LASER APPLICATION N/A 02/02/2016   Procedure: HOLMIUM LASER APPLICATION;  Surgeon: Orson Ape, MD;  Location: [facility] ORS;  Service: Urology;  Laterality: N/A;   IR PERC TUN PERIT CATH WO PORT S&I /IMAG  09/22/2021   IR REMOVAL TUN CV CATH W/O FL  10/15/2021   PERIPHERAL VASCULAR  CATHETERIZATION N/A 08/11/2015   Procedure: Abdominal Aortogram w/Lower Extremity;  Surgeon: Renford Dills, MD;  Location: [facility] INVASIVE CV LAB;  Service: Cardiovascular;  Laterality: N/A;   PERIPHERAL VASCULAR CATHETERIZATION  08/11/2015   Procedure: Lower Extremity Intervention;  Surgeon: Renford Dills, MD;  Location: [facility] INVASIVE CV LAB;  Service: Cardiovascular;;   TEE WITHOUT CARDIOVERSION N/A 09/20/2021   Procedure: TRANSESOPHAGEAL ECHOCARDIOGRAM (TEE);  Surgeon: Debbe Odea, MD;  Location: [facility] ORS;  Service: Cardiovascular;  Laterality: N/A;   TONSILLECTOMY      Home Medications:  Allergies as of 04/13/2023       Reactions   Amiodarone Other (See Comments)   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  Would consider alternative agents if possible.        Medication List        Accurate as of April 13, 2023  2:02 PM. If you have any questions, ask your nurse or doctor.          allopurinol 300 MG tablet Commonly known as: ZYLOPRIM TAKE 1 and 1/2 TABLETS BY MOUTH ONCE DAILY   apixaban 5 MG Tabs tablet Commonly known as: ELIQUIS Take 5 mg by mouth 2 (two) times daily.   atorvastatin 20 MG tablet Commonly known as: LIPITOR Take 1 tablet (20 mg total) by mouth at bedtime.   colchicine 0.6 MG tablet Take 1 tablet (0.6 mg total) by mouth daily. Till pain free  furosemide 20 MG tablet Commonly known as: LASIX Take 1 tablet (20 mg total) by mouth daily.   metoprolol succinate 50 MG 24 hr tablet Commonly known as: TOPROL-XL Take 50 mg by mouth daily.   mirtazapine 15 MG tablet Commonly known as: REMERON Take 1 tablet (15 mg total) by mouth at bedtime.   nitroGLYCERIN 0.4 MG SL tablet Commonly known as: NITROSTAT Place under the tongue.   pantoprazole 40 MG tablet Commonly known as: PROTONIX TAKE 1 TABLET BY MOUTH ONCE EVERY MORNING   valsartan 40 MG tablet Commonly known as: DIOVAN Take 0.5 tablets by  mouth 2 (two) times daily.   Vitamin D (Cholecalciferol) 25 MCG (1000 UT) Caps Take 1 capsule by mouth daily.        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  Would consider alternative agents if possible.    Family History: Family History  Problem Relation Age of Onset   Heart failure Mother    Diabetes Brother    COPD Brother     Social History:  reports that he has quit smoking. His smoking use included cigarettes. He has a 2.5 pack-year smoking history. He has never used smokeless tobacco. He reports that he does not drink alcohol and does not use drugs.   Physical Exam: BP 120/73   Pulse 79   Ht 6' (1.829 m)   Wt 160 lb (72.6 kg)   BMI 21.70 kg/m   Constitutional:  Alert and oriented, No acute distress. HEENT: [place] AT Respiratory: Normal respiratory effort, no increased work of breathing. Psychiatric: Normal mood and affect.   Assessment & Plan:    1. History of prostate cancer High grade Gleason 4+4 adenocarcinoma of the prostate, treated 2011 with brachytherapy. PSA drawn today and will call with results.  2. History of urethral stricture Does have obstructive voiding symptoms. UA today pending PVR was 0 mL.   I have reviewed the above documentation for accuracy and completeness, and I agree with the above.   Duane Altes, MD  [facility] [address], [address] [place], [place] [address] [phone]

## 2023-04-14 LAB — URINALYSIS, COMPLETE
Bilirubin, UA: NEGATIVE
Glucose, UA: NEGATIVE
Nitrite, UA: NEGATIVE
Specific Gravity, UA: 1.025 (ref 1.005–1.030)
Urobilinogen, Ur: 1 mg/dL (ref 0.2–1.0)
pH, UA: 5.5 (ref 5.0–7.5)

## 2023-04-14 LAB — MICROSCOPIC EXAMINATION: Epithelial Cells (non renal): >10 /hpf — AB (ref 0–10)

## 2023-04-14 LAB — PSA: Prostate Specific Ag, Serum: <0.1 ng/mL (ref 0.0–4.0)

## 2023-04-16 ENCOUNTER — Encounter: Payer: Self-pay | Admitting: Urology

## 2023-04-25 DIAGNOSIS — Z95810 Presence of automatic (implantable) cardiac defibrillator: Secondary | ICD-10-CM | POA: Diagnosis not present

## 2023-04-25 DIAGNOSIS — Z4502 Encounter for adjustment and management of automatic implantable cardiac defibrillator: Secondary | ICD-10-CM | POA: Diagnosis not present

## 2023-05-03 DIAGNOSIS — M47817 Spondylosis without myelopathy or radiculopathy, lumbosacral region: Secondary | ICD-10-CM | POA: Diagnosis not present

## 2023-05-03 DIAGNOSIS — M545 Low back pain, unspecified: Secondary | ICD-10-CM | POA: Diagnosis not present

## 2023-05-03 DIAGNOSIS — Z79891 Long term (current) use of opiate analgesic: Secondary | ICD-10-CM | POA: Diagnosis not present

## 2023-05-08 DIAGNOSIS — I5042 Chronic combined systolic (congestive) and diastolic (congestive) heart failure: Secondary | ICD-10-CM | POA: Diagnosis not present

## 2023-05-08 DIAGNOSIS — Z4509 Encounter for adjustment and management of other cardiac device: Secondary | ICD-10-CM | POA: Diagnosis not present

## 2023-05-26 ENCOUNTER — Other Ambulatory Visit: Payer: Medicare HMO | Admitting: Urology

## 2023-05-31 ENCOUNTER — Encounter: Payer: Self-pay | Admitting: Urology

## 2023-05-31 DIAGNOSIS — M545 Low back pain, unspecified: Secondary | ICD-10-CM | POA: Diagnosis not present

## 2023-05-31 DIAGNOSIS — Z79891 Long term (current) use of opiate analgesic: Secondary | ICD-10-CM | POA: Diagnosis not present

## 2023-05-31 DIAGNOSIS — G8929 Other chronic pain: Secondary | ICD-10-CM | POA: Diagnosis not present

## 2023-05-31 DIAGNOSIS — M47817 Spondylosis without myelopathy or radiculopathy, lumbosacral region: Secondary | ICD-10-CM | POA: Diagnosis not present

## 2023-06-06 ENCOUNTER — Other Ambulatory Visit: Payer: Medicare HMO

## 2023-06-06 DIAGNOSIS — E782 Mixed hyperlipidemia: Secondary | ICD-10-CM | POA: Diagnosis not present

## 2023-06-06 DIAGNOSIS — I739 Peripheral vascular disease, unspecified: Secondary | ICD-10-CM

## 2023-06-06 DIAGNOSIS — M1A0790 Idiopathic chronic gout, unspecified ankle and foot, without tophus (tophi): Secondary | ICD-10-CM | POA: Diagnosis not present

## 2023-06-07 LAB — COMPREHENSIVE METABOLIC PANEL
ALT: 12 [IU]/L (ref 0–44)
AST: 18 [IU]/L (ref 0–40)
Albumin: 3.8 g/dL (ref 3.7–4.7)
Alkaline Phosphatase: 149 [IU]/L — ABNORMAL HIGH (ref 44–121)
BUN/Creatinine Ratio: 17 (ref 10–24)
BUN: 18 mg/dL (ref 8–27)
Bilirubin Total: 0.3 mg/dL (ref 0.0–1.2)
CO2: 19 mmol/L — ABNORMAL LOW (ref 20–29)
Calcium: 9 mg/dL (ref 8.6–10.2)
Chloride: 108 mmol/L — ABNORMAL HIGH (ref 96–106)
Creatinine, Ser: 1.03 mg/dL (ref 0.76–1.27)
Globulin, Total: 2.8 g/dL (ref 1.5–4.5)
Glucose: 110 mg/dL — ABNORMAL HIGH (ref 70–99)
Potassium: 4.1 mmol/L (ref 3.5–5.2)
Sodium: 142 mmol/L (ref 134–144)
Total Protein: 6.6 g/dL (ref 6.0–8.5)
eGFR: 72 mL/min/{1.73_m2} (ref >59)

## 2023-06-07 LAB — CK: Total CK: 28 U/L — ABNORMAL LOW (ref 30–208)

## 2023-06-07 LAB — LIPID PANEL
Chol/HDL Ratio: 2.8 ratio (ref 0.0–5.0)
Cholesterol, Total: 88 mg/dL — ABNORMAL LOW (ref 100–199)
HDL: 31 mg/dL — ABNORMAL LOW (ref >39)
LDL Chol Calc (NIH): 39 mg/dL (ref 0–99)
Triglycerides: 92 mg/dL (ref 0–149)
VLDL Cholesterol Cal: 18 mg/dL (ref 5–40)

## 2023-06-08 DIAGNOSIS — I5042 Chronic combined systolic (congestive) and diastolic (congestive) heart failure: Secondary | ICD-10-CM | POA: Diagnosis not present

## 2023-06-09 ENCOUNTER — Ambulatory Visit (INDEPENDENT_AMBULATORY_CARE_PROVIDER_SITE_OTHER): Payer: Medicare HMO | Admitting: Internal Medicine

## 2023-06-09 ENCOUNTER — Encounter: Payer: Self-pay | Admitting: Internal Medicine

## 2023-06-09 VITALS — BP 148/66 | HR 74 | Ht 72.0 in | Wt 161.6 lb

## 2023-06-09 DIAGNOSIS — I10 Essential (primary) hypertension: Secondary | ICD-10-CM

## 2023-06-09 DIAGNOSIS — Z23 Encounter for immunization: Secondary | ICD-10-CM

## 2023-06-09 DIAGNOSIS — I739 Peripheral vascular disease, unspecified: Secondary | ICD-10-CM

## 2023-06-09 DIAGNOSIS — M1A0790 Idiopathic chronic gout, unspecified ankle and foot, without tophus (tophi): Secondary | ICD-10-CM

## 2023-06-09 DIAGNOSIS — E559 Vitamin D deficiency, unspecified: Secondary | ICD-10-CM | POA: Diagnosis not present

## 2023-06-09 DIAGNOSIS — E782 Mixed hyperlipidemia: Secondary | ICD-10-CM

## 2023-06-09 MED ORDER — ATORVASTATIN CALCIUM 20 MG PO TABS: 20.0000 mg | ORAL_TABLET | Freq: Every evening | ORAL | 0 refills | Status: DC

## 2023-06-09 MED ORDER — ALLOPURINOL 300 MG PO TABS
450.0000 mg | ORAL_TABLET | Freq: Every day | ORAL | 1 refills | Status: DC
Start: 2023-06-09 — End: 2023-12-20

## 2023-06-09 NOTE — Progress Notes (Signed)
Established Patient Office Visit  Subjective:  Patient ID: Duane Owens, male    DOB: [birth year]  Age: 82 y.o. MRN: [id number]  Chief Complaint  Patient presents with   Follow-up    3 month follow up, discuss lab results.    No new complaints, here for lab review and medication refills. LDL and TC well controlled on lab review. Triglycerides also satisfactory while gfr has improved on cmp review.    No other concerns at this time.   Past Medical History:  Diagnosis Date   Anemia    Arrhythmia    atrial fibrillation   Atherosclerosis    Cancer [facility])    prostate   CHF (congestive heart failure) (HCC)    Coronary artery disease    Hypercholesteremia    Hyperlipemia    Hypertension    Septicemia (HCC)    Spinal stenosis    Varicose vein    Venous insufficiency     Past Surgical History:  Procedure Laterality Date   BACK SURGERY     CYSTOSCOPY WITH DIRECT VISION INTERNAL URETHROTOMY N/A 02/02/2016   Procedure: CYSTOSCOPY WITH DIRECT VISION INTERNAL URETHROTOMY;  Surgeon: Orson Ape, MD;  Location: [facility] ORS;  Service: Urology;  Laterality: N/A;   HOLMIUM LASER APPLICATION N/A 02/02/2016   Procedure: HOLMIUM LASER APPLICATION;  Surgeon: Orson Ape, MD;  Location: [facility] ORS;  Service: Urology;  Laterality: N/A;   IR PERC TUN PERIT CATH WO PORT Bethzaida Boord&I /IMAG  09/22/2021   IR REMOVAL TUN CV CATH W/O FL  10/15/2021   PERIPHERAL VASCULAR CATHETERIZATION N/A 08/11/2015   Procedure: Abdominal Aortogram w/Lower Extremity;  Surgeon: Renford Dills, MD;  Location: [facility];  Service: Cardiovascular;  Laterality: N/A;   PERIPHERAL VASCULAR CATHETERIZATION  08/11/2015   Procedure: Lower Extremity Intervention;  Surgeon: Renford Dills, MD;  Location: [facility] CV LAB;  Service: Cardiovascular;;   TEE WITHOUT CARDIOVERSION N/A 09/20/2021   Procedure: TRANSESOPHAGEAL ECHOCARDIOGRAM (TEE);  Surgeon: Debbe Odea, MD;  Location: [facility] ORS;  Service:  Cardiovascular;  Laterality: N/A;   TONSILLECTOMY      Social History   Socioeconomic History   Marital status: Widowed    Spouse name: Not on file   Number of children: Not on file   Years of education: Not on file   Highest education level: Not on file  Occupational History   Not on file  Tobacco Use   Smoking status: Former    Current packs/day: 0.50    Average packs/day: 0.5 packs/day for 5.0 years (2.5 ttl pk-yrs)    Types: Cigarettes   Smokeless tobacco: Never   Tobacco comments:    Has called [facility] and ordered nicotene patches. Will Quit when patches start  Vaping Use   Vaping status: Never Used  Substance and Sexual Activity   Alcohol use: No   Drug use: No   Sexual activity: Not on file  Other Topics Concern   Not on file  Social History Narrative   Not on file   Social Determinants of Health   Financial Resource Strain: Not on file  Food Insecurity: No Food Insecurity (05/03/2022)   Hunger Vital Sign    Worried About Running Out of Food in the Last Year: Never true    Ran Out of Food in the Last Year: Never true  Transportation Needs: No Transportation Needs (05/03/2022)   PRAPARE - Transportation    Lack of Transportation (Medical): No    Lack  of Transportation (Non-Medical): No  Physical Activity: Not on file  Stress: Not on file  Social Connections: Not on file  Intimate Partner Violence: Not on file    Family History  Problem Relation Age of Onset   Heart failure Mother    Diabetes Brother    COPD Brother     Allergies  Allergen Reactions   Amiodarone Other (See Comments)    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  Would consider alternative agents if possible.    Outpatient Medications Prior to Visit  Medication Sig   apixaban (ELIQUIS) 5 MG TABS tablet Take 5 mg by mouth 2 (two) times daily.   metoprolol succinate (TOPROL-XL) 50 MG 24 hr tablet Take 50 mg by mouth daily.   mirtazapine  (REMERON) 15 MG tablet Take 1 tablet (15 mg total) by mouth at bedtime.   pantoprazole (PROTONIX) 40 MG tablet TAKE 1 TABLET BY MOUTH ONCE EVERY MORNING   valsartan (DIOVAN) 40 MG tablet Take 0.5 tablets by mouth 2 (two) times daily.   [DISCONTINUED] allopurinol (ZYLOPRIM) 300 MG tablet TAKE 1 and 1/2 TABLETS BY MOUTH ONCE DAILY   [DISCONTINUED] atorvastatin (LIPITOR) 20 MG tablet Take 1 tablet (20 mg total) by mouth at bedtime.   colchicine 0.6 MG tablet Take 1 tablet (0.6 mg total) by mouth daily. Till pain free (Patient not taking: Reported on 06/09/2023)   furosemide (LASIX) 20 MG tablet Take 1 tablet (20 mg total) by mouth daily. (Patient not taking: Reported on 06/09/2023)   nitroGLYCERIN (NITROSTAT) 0.4 MG SL tablet Place under the tongue. (Patient not taking: Reported on 06/09/2023)   No facility-administered medications prior to visit.    Review of Systems  Constitutional: Negative.   HENT: Negative.    Eyes: Negative.   Respiratory:  Positive for shortness of breath.   Cardiovascular: Negative.   Gastrointestinal: Negative.   Genitourinary: Negative.   Skin: Negative.   Neurological: Negative.   Endo/Heme/Allergies: Negative.        Objective:   BP (!) 148/66   Pulse 74   Ht 6' (1.829 m)   Wt 161 lb 9.6 oz (73.3 kg)   SpO2 96%   BMI 21.92 kg/m   Vitals:   06/09/23 1112  BP: (!) 148/66  Pulse: 74  Height: 6' (1.829 m)  Weight: 161 lb 9.6 oz (73.3 kg)  SpO2: 96%  BMI (Calculated): 21.91    Physical Exam Vitals reviewed.  Constitutional:      Appearance: Normal appearance.  HENT:     Head: Normocephalic.     Left Ear: There is no impacted cerumen.     Nose: Nose normal.     Mouth/Throat:     Mouth: Mucous membranes are moist.     Pharynx: No posterior oropharyngeal erythema.  Eyes:     Extraocular Movements: Extraocular movements intact.     Pupils: Pupils are equal, round, and reactive to light.  Cardiovascular:     Rate and Rhythm: Regular  rhythm.     Chest Wall: PMI is not displaced.     Pulses: Normal pulses.     Heart sounds: Normal heart sounds. No murmur heard. Pulmonary:     Effort: Pulmonary effort is normal.     Breath sounds: Normal air entry. No rhonchi or rales.  Abdominal:     General: Abdomen is flat. Bowel sounds are normal. There is no distension.     Palpations: Abdomen is soft. There is no hepatomegaly, splenomegaly or mass.  Tenderness: There is no abdominal tenderness.  Musculoskeletal:        General: Normal range of motion.     Cervical back: Normal range of motion and neck supple.     Right lower leg: No edema.     Left lower leg: No edema.     Comments: Wearing lumbar brace  Skin:    General: Skin is warm and dry.  Neurological:     General: No focal deficit present.     Mental Status: He is alert and oriented to person, place, and time.     Cranial Nerves: No cranial nerve deficit.     Motor: No weakness.  Psychiatric:        Mood and Affect: Mood normal.        Behavior: Behavior normal.      No results found for any visits on 06/09/23.  Recent Results (from the past 2160 hour(Abass Misener))  Bladder Scan (Post Void Residual) in office     Status: None   Collection Time: 04/13/23  2:10 PM  Result Value Ref Range   Scan Result 0   Urinalysis, Complete     Status: Abnormal   Collection Time: 04/13/23  2:12 PM  Result Value Ref Range   Specific Gravity, UA 1.025 1.005 - 1.030   pH, UA 5.5 5.0 - 7.5   Color, UA Yellow Yellow   Appearance Ur Hazy (A) Clear   Leukocytes,UA Trace (A) Negative   Protein,UA 2+ (A) Negative/Trace   Glucose, UA Negative Negative   Ketones, UA 1+ (A) Negative   RBC, UA Trace (A) Negative   Bilirubin, UA Negative Negative   Urobilinogen, Ur 1.0 0.2 - 1.0 mg/dL   Nitrite, UA Negative Negative   Microscopic Examination See below:   Microscopic Examination     Status: Abnormal   Collection Time: 04/13/23  2:12 PM   Urine  Result Value Ref Range   WBC, UA 6-10  (A) 0 - 5 /hpf   RBC, Urine 3-10 (A) 0 - 2 /hpf   Epithelial Cells (non renal) >10 (A) 0 - 10 /hpf   Casts Present (A) None seen /lpf   Cast Type Hyaline casts N/A    Comment: Granular casts   Mucus, UA Present (A) Not Estab.   Bacteria, UA Moderate (A) None seen/Few  PSA     Status: None   Collection Time: 04/13/23  2:36 PM  Result Value Ref Range   Prostate Specific Ag, Serum <0.1 0.0 - 4.0 ng/mL    Comment: Roche ECLIA methodology. According to the American Urological Association, Serum PSA should decrease and remain at undetectable levels after radical prostatectomy. The AUA defines biochemical recurrence as an initial PSA value 0.2 ng/mL or greater followed by a subsequent confirmatory PSA value 0.2 ng/mL or greater. Values obtained with different assay methods or kits cannot be used interchangeably. Results cannot be interpreted as absolute evidence of the presence or absence of malignant disease.   CK     Status: Abnormal   Collection Time: 06/06/23 10:11 AM  Result Value Ref Range   Total CK 28 (L) 30 - 208 U/L  Comprehensive metabolic panel     Status: Abnormal   Collection Time: 06/06/23 10:11 AM  Result Value Ref Range   Glucose 110 (H) 70 - 99 mg/dL   BUN 18 8 - 27 mg/dL   Creatinine, Ser 1.61 0.76 - 1.27 mg/dL   eGFR 72 >09 UE/AVW/0.98   BUN/Creatinine Ratio 17 10 -  24   Sodium 142 134 - 144 mmol/L   Potassium 4.1 3.5 - 5.2 mmol/L   Chloride 108 (H) 96 - 106 mmol/L   CO2 19 (L) 20 - 29 mmol/L   Calcium 9.0 8.6 - 10.2 mg/dL   Total Protein 6.6 6.0 - 8.5 g/dL   Albumin 3.8 3.7 - 4.7 g/dL   Globulin, Total 2.8 1.5 - 4.5 g/dL   Bilirubin Total 0.3 0.0 - 1.2 mg/dL   Alkaline Phosphatase 149 (H) 44 - 121 IU/L   AST 18 0 - 40 IU/L   ALT 12 0 - 44 IU/L  Lipid panel     Status: Abnormal   Collection Time: 06/06/23 10:11 AM  Result Value Ref Range   Cholesterol, Total 88 (L) 100 - 199 mg/dL   Triglycerides 92 0 - 149 mg/dL   HDL 31 (L) >16 mg/dL   VLDL  Cholesterol Cal 18 5 - 40 mg/dL   LDL Chol Calc (NIH) 39 0 - 99 mg/dL   Chol/HDL Ratio 2.8 0.0 - 5.0 ratio    Comment:                                   T. Chol/HDL Ratio                                             Men  Women                               1/2 Avg.Risk  3.4    3.3                                   Avg.Risk  5.0    4.4                                2X Avg.Risk  9.6    7.1                                3X Avg.Risk 23.4   11.0       Assessment & Plan:  As per problem list  Problem List Items Addressed This Visit       Cardiovascular and Mediastinum   Essential hypertension, benign - Primary   Relevant Medications   atorvastatin (LIPITOR) 20 MG tablet   PAD (peripheral artery disease) (HCC)   Relevant Medications   atorvastatin (LIPITOR) 20 MG tablet     Other   Gout (Chronic)   Relevant Medications   allopurinol (ZYLOPRIM) 300 MG tablet   Other Relevant Orders   Uric acid   Mixed hyperlipidemia   Relevant Medications   atorvastatin (LIPITOR) 20 MG tablet   Other Relevant Orders   Lipid panel   Comprehensive metabolic panel   Vitamin D deficiency    Return in about 3 months (around 09/09/2023) for fu with labs prior.   Total time spent: 20 minutes  Luna Fuse, MD  06/09/2023   This document may have been prepared by [facility] Voice Recognition software and as such may include  unintentional dictation errors.

## 2023-06-12 LAB — URIC ACID: Uric Acid: 3.5 mg/dL — ABNORMAL LOW (ref 3.8–8.4)

## 2023-06-12 LAB — SPECIMEN STATUS REPORT

## 2023-06-15 DIAGNOSIS — M109 Gout, unspecified: Secondary | ICD-10-CM | POA: Diagnosis not present

## 2023-06-15 DIAGNOSIS — I10 Essential (primary) hypertension: Secondary | ICD-10-CM | POA: Diagnosis not present

## 2023-06-15 DIAGNOSIS — E785 Hyperlipidemia, unspecified: Secondary | ICD-10-CM | POA: Diagnosis not present

## 2023-06-15 DIAGNOSIS — F419 Anxiety disorder, unspecified: Secondary | ICD-10-CM | POA: Diagnosis not present

## 2023-06-15 DIAGNOSIS — K219 Gastro-esophageal reflux disease without esophagitis: Secondary | ICD-10-CM | POA: Diagnosis not present

## 2023-06-15 DIAGNOSIS — I252 Old myocardial infarction: Secondary | ICD-10-CM | POA: Diagnosis not present

## 2023-06-15 DIAGNOSIS — N529 Male erectile dysfunction, unspecified: Secondary | ICD-10-CM | POA: Diagnosis not present

## 2023-06-15 DIAGNOSIS — Z008 Encounter for other general examination: Secondary | ICD-10-CM | POA: Diagnosis not present

## 2023-06-15 DIAGNOSIS — R32 Unspecified urinary incontinence: Secondary | ICD-10-CM | POA: Diagnosis not present

## 2023-06-15 DIAGNOSIS — F329 Major depressive disorder, single episode, unspecified: Secondary | ICD-10-CM | POA: Diagnosis not present

## 2023-06-15 DIAGNOSIS — M5440 Lumbago with sciatica, unspecified side: Secondary | ICD-10-CM | POA: Diagnosis not present

## 2023-06-15 DIAGNOSIS — M1990 Unspecified osteoarthritis, unspecified site: Secondary | ICD-10-CM | POA: Diagnosis not present

## 2023-06-15 DIAGNOSIS — I255 Ischemic cardiomyopathy: Secondary | ICD-10-CM | POA: Diagnosis not present

## 2023-06-28 ENCOUNTER — Ambulatory Visit: Payer: Medicare HMO | Admitting: Urology

## 2023-06-28 ENCOUNTER — Encounter: Payer: Self-pay | Admitting: Urology

## 2023-06-28 VITALS — BP 130/82 | HR 74 | Ht 72.0 in | Wt 161.0 lb

## 2023-06-28 DIAGNOSIS — N35912 Unspecified bulbous urethral stricture, male: Secondary | ICD-10-CM

## 2023-06-28 DIAGNOSIS — R39198 Other difficulties with micturition: Secondary | ICD-10-CM | POA: Diagnosis not present

## 2023-06-28 DIAGNOSIS — N35812 Other urethral bulbous stricture, male: Secondary | ICD-10-CM

## 2023-06-28 LAB — MICROSCOPIC EXAMINATION

## 2023-06-28 LAB — URINALYSIS, COMPLETE
Bilirubin, UA: NEGATIVE
Glucose, UA: NEGATIVE
Ketones, UA: NEGATIVE
Leukocytes,UA: NEGATIVE
Nitrite, UA: NEGATIVE
Specific Gravity, UA: >1.03 — ABNORMAL HIGH (ref 1.005–1.030)
Urobilinogen, Ur: 4 mg/dL — ABNORMAL HIGH (ref 0.2–1.0)
pH, UA: 6 (ref 5.0–7.5)

## 2023-06-28 NOTE — Progress Notes (Signed)
   06/28/23  CC:  Chief Complaint  Patient presents with   Cysto    HPI: History of urethral stricture.  Refer to my office note 04/13/2023  NED. A&Ox3.   No respiratory distress   Abd soft, NT, ND Normal phallus with bilateral descended testicles  Cystoscopy Procedure Note  Patient identification was confirmed, informed consent was obtained, and patient was prepped using Betadine solution.  Lidocaine jelly was administered per urethral meatus.     Pre-Procedure: - Inspection reveals a normal caliber meatus.  Procedure: The flexible cystoscope was introduced without difficulty - Wide caliber penile urethral strictures; ~ 10 French proximal bulbar stricture which impeded cystoscope passage   Post-Procedure: - Patient tolerated the procedure well  Assessment/ Plan: Bulbar urethral stricture Recommend scheduling balloon dilation with Optilume.  The procedure was discussed and that the medicated balloon decreases the incidence of recurrent stricture He wanted to hold off scheduling for now but indicated he will call back when ready to schedule   Riki Altes, MD

## 2023-06-29 DIAGNOSIS — G8929 Other chronic pain: Secondary | ICD-10-CM | POA: Diagnosis not present

## 2023-06-29 DIAGNOSIS — M47817 Spondylosis without myelopathy or radiculopathy, lumbosacral region: Secondary | ICD-10-CM | POA: Diagnosis not present

## 2023-06-29 DIAGNOSIS — M545 Low back pain, unspecified: Secondary | ICD-10-CM | POA: Diagnosis not present

## 2023-06-29 DIAGNOSIS — Z79891 Long term (current) use of opiate analgesic: Secondary | ICD-10-CM | POA: Diagnosis not present

## 2023-07-10 DIAGNOSIS — I5042 Chronic combined systolic (congestive) and diastolic (congestive) heart failure: Secondary | ICD-10-CM | POA: Diagnosis not present

## 2023-07-22 ENCOUNTER — Other Ambulatory Visit: Payer: Self-pay | Admitting: Internal Medicine

## 2023-07-22 DIAGNOSIS — E782 Mixed hyperlipidemia: Secondary | ICD-10-CM

## 2023-07-24 ENCOUNTER — Other Ambulatory Visit: Payer: Self-pay

## 2023-07-25 DIAGNOSIS — Z4502 Encounter for adjustment and management of automatic implantable cardiac defibrillator: Secondary | ICD-10-CM | POA: Diagnosis not present

## 2023-07-25 DIAGNOSIS — Z95810 Presence of automatic (implantable) cardiac defibrillator: Secondary | ICD-10-CM | POA: Diagnosis not present

## 2023-08-01 ENCOUNTER — Other Ambulatory Visit: Payer: Self-pay

## 2023-08-02 DIAGNOSIS — M47817 Spondylosis without myelopathy or radiculopathy, lumbosacral region: Secondary | ICD-10-CM | POA: Diagnosis not present

## 2023-08-02 DIAGNOSIS — G8929 Other chronic pain: Secondary | ICD-10-CM | POA: Diagnosis not present

## 2023-08-02 DIAGNOSIS — Z79891 Long term (current) use of opiate analgesic: Secondary | ICD-10-CM | POA: Diagnosis not present

## 2023-08-02 DIAGNOSIS — M545 Low back pain, unspecified: Secondary | ICD-10-CM | POA: Diagnosis not present

## 2023-08-03 ENCOUNTER — Other Ambulatory Visit: Payer: Self-pay

## 2023-08-10 DIAGNOSIS — I5042 Chronic combined systolic (congestive) and diastolic (congestive) heart failure: Secondary | ICD-10-CM | POA: Diagnosis not present

## 2023-08-10 DIAGNOSIS — Z45018 Encounter for adjustment and management of other part of cardiac pacemaker: Secondary | ICD-10-CM | POA: Diagnosis not present

## 2023-08-24 ENCOUNTER — Other Ambulatory Visit: Payer: Self-pay

## 2023-08-30 DIAGNOSIS — M47817 Spondylosis without myelopathy or radiculopathy, lumbosacral region: Secondary | ICD-10-CM | POA: Diagnosis not present

## 2023-08-30 DIAGNOSIS — G8929 Other chronic pain: Secondary | ICD-10-CM | POA: Diagnosis not present

## 2023-08-30 DIAGNOSIS — Z79891 Long term (current) use of opiate analgesic: Secondary | ICD-10-CM | POA: Diagnosis not present

## 2023-08-30 DIAGNOSIS — M545 Low back pain, unspecified: Secondary | ICD-10-CM | POA: Diagnosis not present

## 2023-09-06 ENCOUNTER — Ambulatory Visit
Admission: RE | Admit: 2023-09-06 | Discharge: 2023-09-06 | Disposition: A | Discharge status: Home / Self Care | Payer: Medicare HMO | Source: Ambulatory Visit | Attending: Nurse Practitioner | Admitting: Nurse Practitioner

## 2023-09-06 ENCOUNTER — Other Ambulatory Visit: Payer: Self-pay | Admitting: Nurse Practitioner

## 2023-09-06 ENCOUNTER — Other Ambulatory Visit: Payer: Medicare HMO

## 2023-09-06 DIAGNOSIS — M47817 Spondylosis without myelopathy or radiculopathy, lumbosacral region: Secondary | ICD-10-CM

## 2023-09-06 DIAGNOSIS — E782 Mixed hyperlipidemia: Secondary | ICD-10-CM | POA: Diagnosis not present

## 2023-09-06 DIAGNOSIS — M545 Low back pain, unspecified: Secondary | ICD-10-CM | POA: Diagnosis not present

## 2023-09-07 LAB — COMPREHENSIVE METABOLIC PANEL
ALT: 28 [IU]/L (ref 0–44)
AST: 28 [IU]/L (ref 0–40)
Albumin: 4 g/dL (ref 3.7–4.7)
Alkaline Phosphatase: 168 [IU]/L — ABNORMAL HIGH (ref 44–121)
BUN/Creatinine Ratio: 16 (ref 10–24)
BUN: 16 mg/dL (ref 8–27)
Bilirubin Total: 0.4 mg/dL (ref 0.0–1.2)
CO2: 16 mmol/L — ABNORMAL LOW (ref 20–29)
Calcium: 9.4 mg/dL (ref 8.6–10.2)
Chloride: 112 mmol/L — ABNORMAL HIGH (ref 96–106)
Creatinine, Ser: 1.03 mg/dL (ref 0.76–1.27)
Globulin, Total: 3.3 g/dL (ref 1.5–4.5)
Glucose: 107 mg/dL — ABNORMAL HIGH (ref 70–99)
Potassium: 4.6 mmol/L (ref 3.5–5.2)
Sodium: 148 mmol/L — ABNORMAL HIGH (ref 134–144)
Total Protein: 7.3 g/dL (ref 6.0–8.5)
eGFR: 72 mL/min/{1.73_m2} (ref >59)

## 2023-09-07 LAB — LIPID PANEL
Chol/HDL Ratio: 3.6 {ratio} (ref 0.0–5.0)
Cholesterol, Total: 107 mg/dL (ref 100–199)
HDL: 30 mg/dL — ABNORMAL LOW (ref >39)
LDL Chol Calc (NIH): 60 mg/dL (ref 0–99)
Triglycerides: 82 mg/dL (ref 0–149)
VLDL Cholesterol Cal: 17 mg/dL (ref 5–40)

## 2023-09-11 ENCOUNTER — Ambulatory Visit: Payer: Medicare HMO | Admitting: Internal Medicine

## 2023-09-11 DIAGNOSIS — Z4502 Encounter for adjustment and management of automatic implantable cardiac defibrillator: Secondary | ICD-10-CM | POA: Diagnosis not present

## 2023-09-11 DIAGNOSIS — I5042 Chronic combined systolic (congestive) and diastolic (congestive) heart failure: Secondary | ICD-10-CM | POA: Diagnosis not present

## 2023-09-13 ENCOUNTER — Ambulatory Visit: Payer: Medicare HMO | Admitting: Internal Medicine

## 2023-09-20 ENCOUNTER — Ambulatory Visit (INDEPENDENT_AMBULATORY_CARE_PROVIDER_SITE_OTHER): Payer: Medicare HMO | Admitting: Internal Medicine

## 2023-09-20 ENCOUNTER — Encounter: Payer: Self-pay | Admitting: Internal Medicine

## 2023-09-20 VITALS — BP 128/71 | HR 98 | Temp 97.7°F | Ht 72.0 in | Wt 160.0 lb

## 2023-09-20 DIAGNOSIS — E782 Mixed hyperlipidemia: Secondary | ICD-10-CM | POA: Diagnosis not present

## 2023-09-20 DIAGNOSIS — E870 Hyperosmolality and hypernatremia: Secondary | ICD-10-CM | POA: Diagnosis not present

## 2023-09-20 DIAGNOSIS — F5101 Primary insomnia: Secondary | ICD-10-CM

## 2023-09-20 DIAGNOSIS — E559 Vitamin D deficiency, unspecified: Secondary | ICD-10-CM | POA: Diagnosis not present

## 2023-09-20 IMAGING — CT CT HUMERUS*R* W/O CM
2 of 3 series · 13 of 33 positions shown, 16 images · non-contrast
Comparison: None.

CLINICAL DATA: Foreign body suspected, upper arm, negative x-ray.

EXAM:
CT OF THE RIGHT HUMERUS WITHOUT CONTRAST
TECHNIQUE: Multidetector CT imaging was performed according to the standard
protocol. Multiplanar CT image reconstructions were also generated.
RADIATION DOSE REDUCTION: This exam was performed according to the
departmental dose-optimization program which includes automated
exposure control, adjustment of the mA and/or kV according to
patient size and/or use of iterative reconstruction technique.

[Series 6: extremity soft tissue · axial · 0.39mm/px · z∈[-884,-568]mm · 10 of 188 slices shown, 13 images]
[im 15/188  soft-tissue]
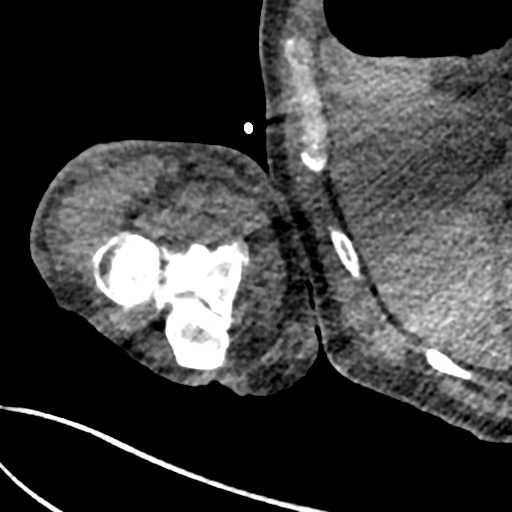
[im 15/188  bone]
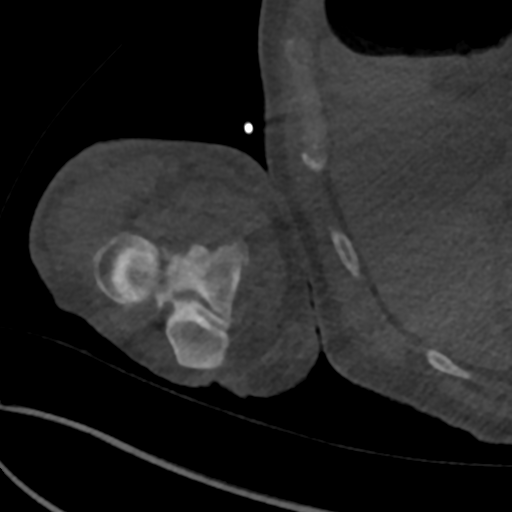
[im 29/188  bone]
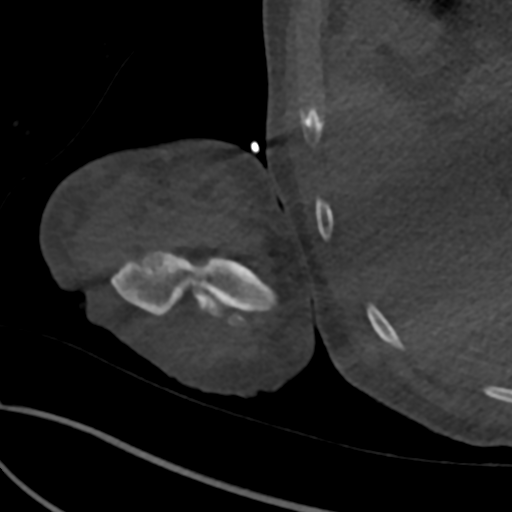
[im 58/188  bone]
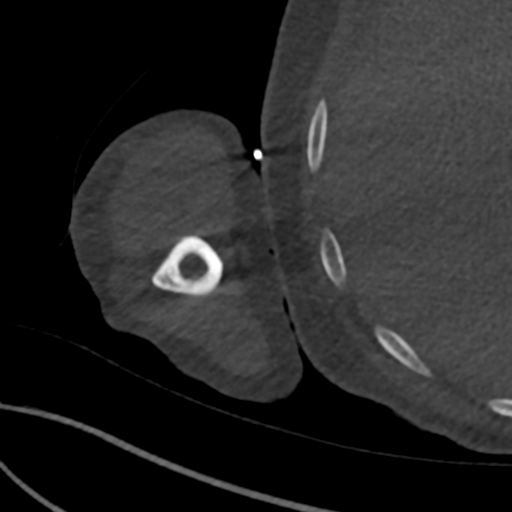
[im 72/188  bone]
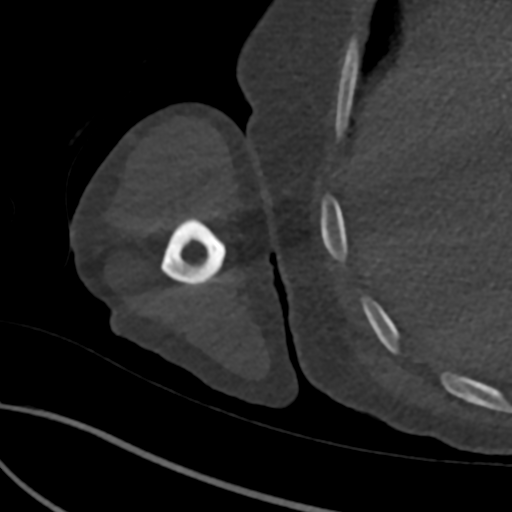
[im 87/188  soft-tissue]
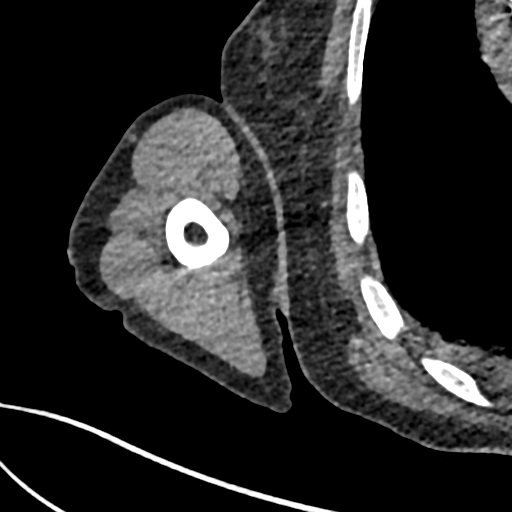
[im 87/188  bone]
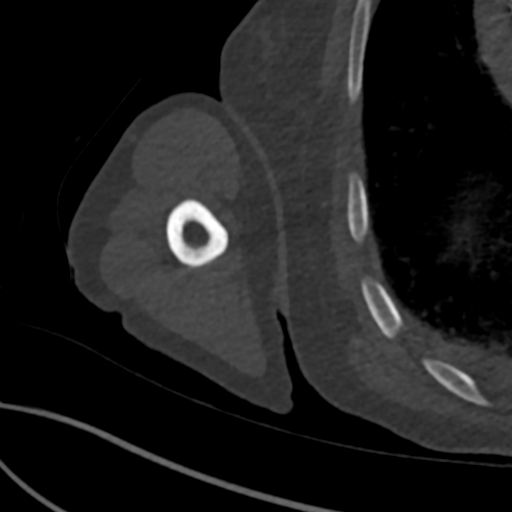
[im 101/188  bone]
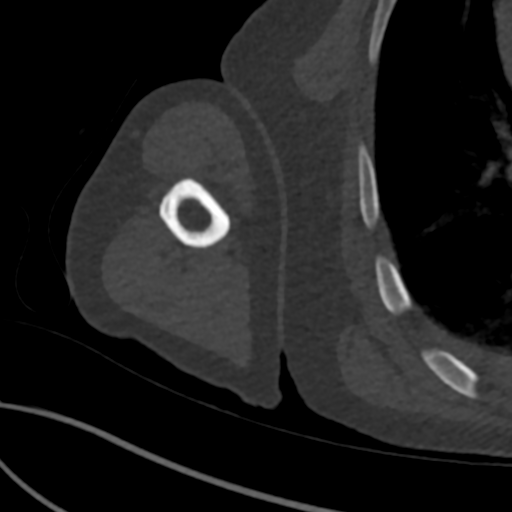
[im 116/188  bone]
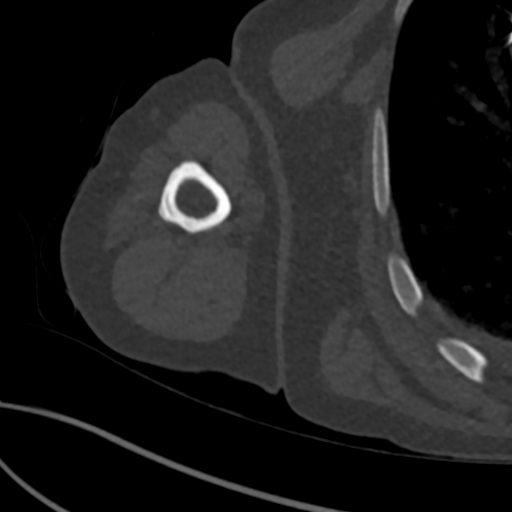
[im 144/188  bone]
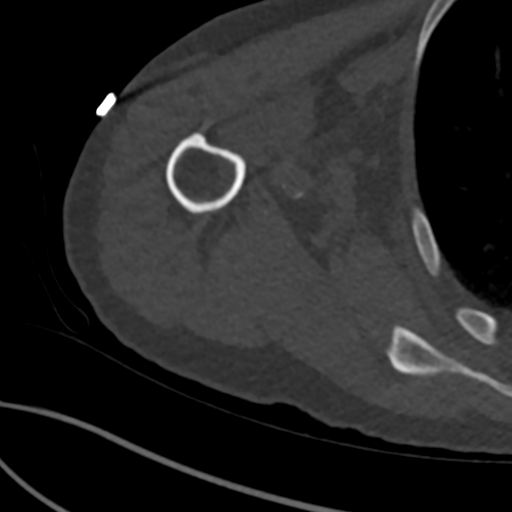
[im 159/188  soft-tissue]
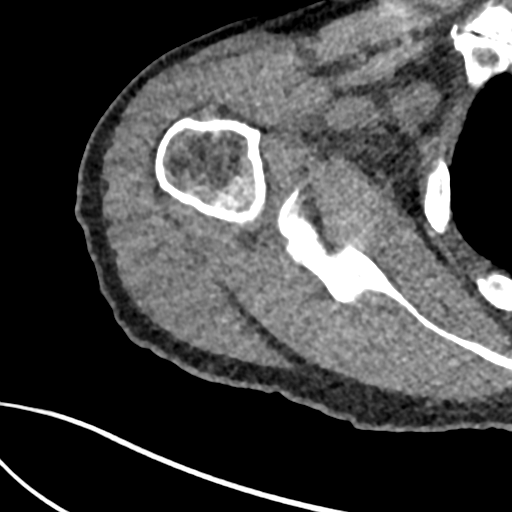
[im 159/188  bone]
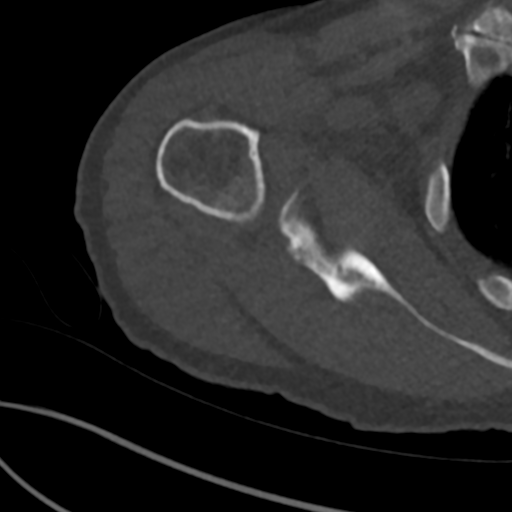
[im 173/188  bone]
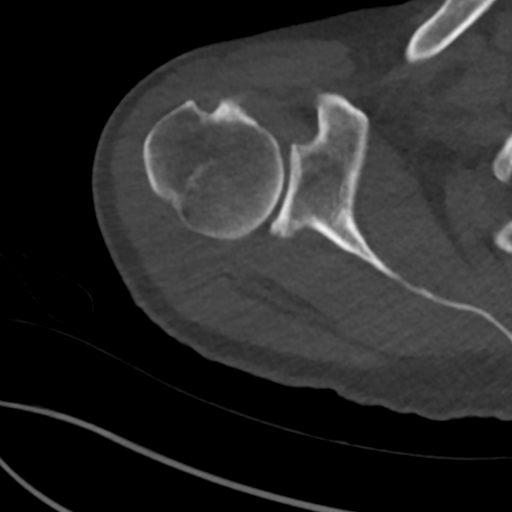

[Series 13: cor soft tissue · coronal · 0.37mm/px · 3 of 63 slices shown]
[im 13/63  bone]
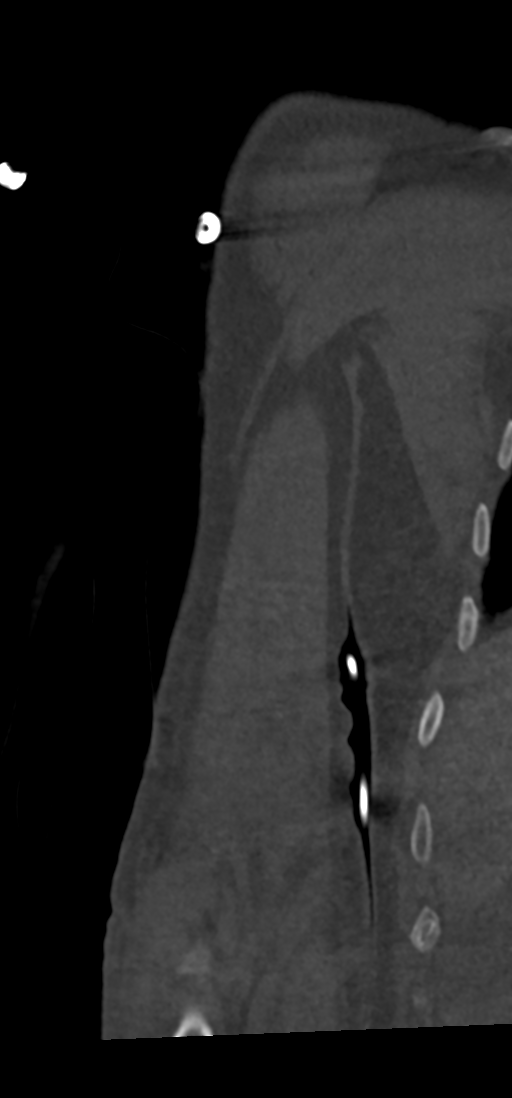
[im 25/63  bone]
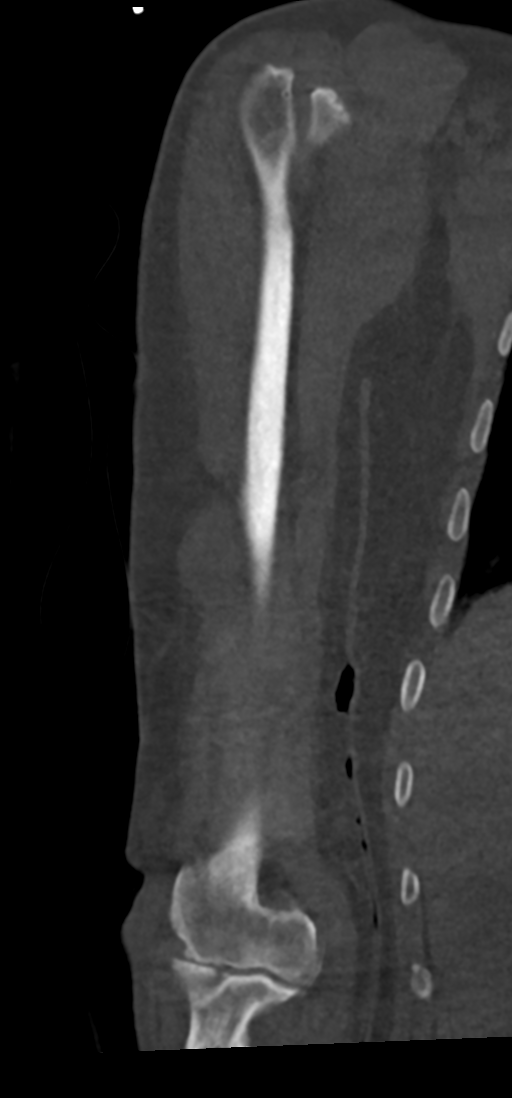
[im 38/63  bone]
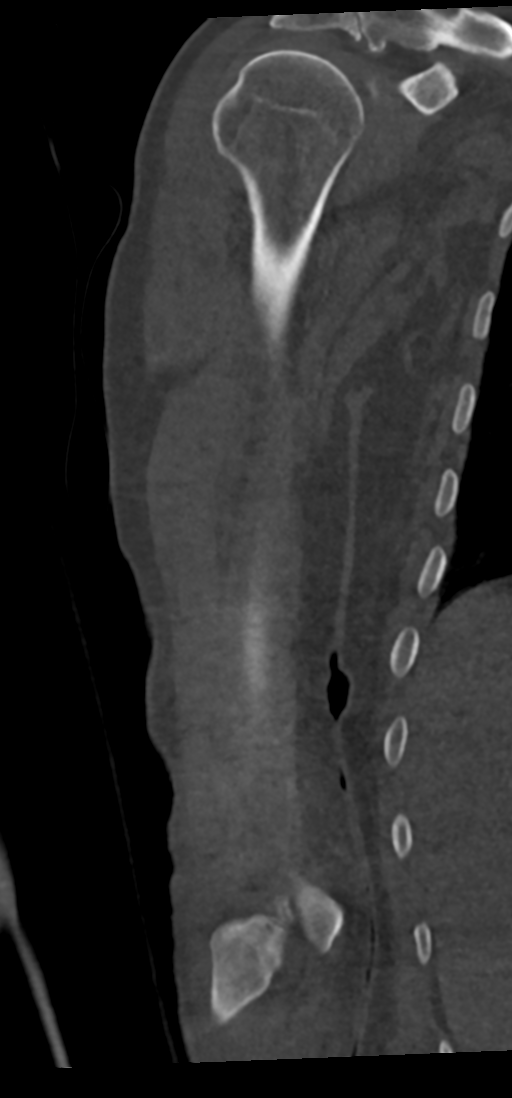

[13 of 33 positions shown; findings below may reference images not displayed]

FINDINGS: Bones/Joint/Cartilage

No fracture or dislocation. Normal alignment. No joint effusion.
Moderate acromioclavicular osteoarthritis.

Ligaments

Ligaments are suboptimally evaluated by CT.

Muscles and Tendons
Muscles are normal in bulk and density. No intramuscular hematoma or
fluid collection. No radiopaque foreign body.

Soft tissue
No fluid collection or hematoma.  No soft tissue mass.
IMPRESSION: 1.  No acute osseous abnormality.

2. Muscles and subcutaneous soft tissues are within normal limits.
No evidence of fluid collection or abscess. No radiopaque foreign
body.

## 2023-09-20 MED ORDER — ATORVASTATIN CALCIUM 20 MG PO TABS: 20.0000 mg | ORAL_TABLET | Freq: Every day | ORAL | 0 refills | Status: DC

## 2023-09-20 MED ORDER — PANTOPRAZOLE SODIUM 40 MG PO TBEC: 40.0000 mg | DELAYED_RELEASE_TABLET | Freq: Every day | ORAL | 1 refills | Status: DC

## 2023-09-20 MED ORDER — TRAZODONE HCL 50 MG PO TABS: 50.0000 mg | ORAL_TABLET | Freq: Every evening | ORAL | 2 refills | Status: DC | PRN

## 2023-09-20 MED ORDER — VITAMIN D (CHOLECALCIFEROL) 25 MCG (1000 UT) PO CAPS: 1.0000 | ORAL_CAPSULE | Freq: Every day | ORAL | 1 refills | Status: AC

## 2023-09-20 NOTE — Progress Notes (Signed)
 Established Patient Office Visit  Subjective:  Patient ID: Duane Owens, male    DOB: 1940/09/23  Age: 83 y.o. MRN: [id number]  Chief Complaint  Patient presents with   Follow-up    3 month follow up with labs     No new complaints, here for lab review and medication refills. Labs reviewed and notable for hypernatremia to which he admits poor fluid intake although Cr remains normal.  LDL remains at target however.    No other concerns at this time.   Past Medical History:  Diagnosis Date   Anemia    Arrhythmia    atrial fibrillation   Atherosclerosis    Cancer [facility])    prostate   CHF (congestive heart failure) (HCC)    Coronary artery disease    Hypercholesteremia    Hyperlipemia    Hypertension    Septicemia (HCC)    Spinal stenosis    Varicose vein    Venous insufficiency     Past Surgical History:  Procedure Laterality Date   BACK SURGERY     CYSTOSCOPY WITH DIRECT VISION INTERNAL URETHROTOMY N/A 02/02/2016   Procedure: CYSTOSCOPY WITH DIRECT VISION INTERNAL URETHROTOMY;  Surgeon: Orson Ape, MD;  Location: [facility] ORS;  Service: Urology;  Laterality: N/A;   HOLMIUM LASER APPLICATION N/A 02/02/2016   Procedure: HOLMIUM LASER APPLICATION;  Surgeon: Orson Ape, MD;  Location: [facility] ORS;  Service: Urology;  Laterality: N/A;   IR PERC TUN PERIT CATH WO PORT [place] [facility]I /IMAG  09/22/2021   IR REMOVAL TUN CV CATH W/O FL  10/15/2021   PERIPHERAL VASCULAR CATHETERIZATION N/A 08/11/2015   Procedure: Abdominal Aortogram w/Lower Extremity;  Surgeon: Renford Dills, MD;  Location: [facility] INVASIVE CV LAB;  Service: Cardiovascular;  Laterality: N/A;   PERIPHERAL VASCULAR CATHETERIZATION  08/11/2015   Procedure: Lower Extremity Intervention;  Surgeon: Renford Dills, MD;  Location: [facility] INVAS[facility] CV LAB;  Service: Cardiovascular;;   TEE WITHOUT CARDIOVERSION N/A 09/20/2021   Procedure: TRANSESOPHAGEAL ECHOCARDIOGRAM (TEE);  Surgeon: Debbe Odea, MD;  Location: [facility] ORS;   Service: Cardiovascular;  Laterality: N/A;   TONSILLECTOMY      Social History   Socioeconomic History   Marital status: Widowed    Spouse name: Not on file   Number of children: Not on file   Years of education: Not on file   Highest education level: Not on file  Occupational History   Not on file  Tobacco Use   Smoking status: Former    Current packs/day: 0.50    Average packs/day: 0.5 packs/day for 5.0 years (2.5 ttl pk-yrs)    Types: Cigarettes   Smokeless tobacco: Never   Tobacco comments:    Has called [place] [facility] and ordered nicotene patches. Will Quit when patches start  Vaping Use   Vaping status: Never Used  Substance and Sexual Activity   Alcohol use: No   Drug use: No   Sexual activity: Not on file  Other Topics Concern   Not on file  Social History Narrative   Not on file   Social Drivers of Health   Financial Resource Strain: Not on file  Food Insecurity: No Food Insecurity (05/03/2022)   Hunger Vital Sign    Worried About Running Out of Food in the Last Year: Never true    Ran Out of Food in the Last Year: Never true  Transportation Needs: No Transportation Needs (05/03/2022)   PRAPARE - Administrator, Civil Service (Medical):  No    Lack of Transportation (Non-Medical): No  Physical Activity: Not on file  Stress: Not on file  Social Connections: Not on file  Intimate Partner Violence: Not on file    Family History  Problem Relation Age of Onset   Heart failure Mother    Diabetes Brother    COPD Brother     Allergies  Allergen Reactions   Amiodarone Other (See Comments)    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  Would consider alternative agents if possible.    Outpatient Medications Prior to Visit  Medication Sig   allopurinol (ZYLOPRIM) 300 MG tablet Take 1.5 tablets (450 mg total) by mouth daily.   apixaban (ELIQUIS) 5 MG TABS tablet Take 5 mg by mouth 2 (two) times daily.    HYDROcodone-acetaminophen (NORCO) 7.5-325 MG tablet Take 1 tablet by mouth 3 (three) times daily as needed.   metoprolol succinate (TOPROL-XL) 50 MG 24 hr tablet Take 50 mg by mouth daily.   mirtazapine (REMERON) 15 MG tablet Take 1 tablet (15 mg total) by mouth at bedtime.   nitroGLYCERIN (NITROSTAT) 0.4 MG SL tablet Place under the tongue.   valsartan (DIOVAN) 40 MG tablet Take 0.5 tablets by mouth 2 (two) times daily.   [DISCONTINUED] atorvastatin (LIPITOR) 20 MG tablet TAKE 1 TABLET BY MOUTH AT BEDTIME   [DISCONTINUED] pantoprazole (PROTONIX) 40 MG tablet TAKE 1 TABLET BY MOUTH ONCE EVERY MORNING   No facility-administered medications prior to visit.    Review of Systems  Constitutional: Negative.   HENT: Negative.    Eyes: Negative.   Respiratory:  Positive for shortness of breath.   Cardiovascular: Negative.   Gastrointestinal: Negative.   Genitourinary: Negative.   Skin: Negative.   Neurological: Negative.   Endo/Heme/Allergies: Negative.        Objective:   BP 128/71   Pulse 98   Temp 97.7 F (36.5 C)   Ht 6' (1.829 m)   Wt 160 lb (72.6 kg)   SpO2 94%   BMI 21.70 kg/m   Vitals:   09/20/23 1132  BP: 128/71  Pulse: 98  Temp: 97.7 F (36.5 C)  Height: 6' (1.829 m)  Weight: 160 lb (72.6 kg)  SpO2: 94%  BMI (Calculated): 21.7    Physical Exam Vitals reviewed.  Constitutional:      Appearance: Normal appearance.  HENT:     Head: Normocephalic.     Left Ear: There is no impacted cerumen.     Nose: Nose normal.     Mouth/Throat:     Mouth: Mucous membranes are moist.     Pharynx: No posterior oropharyngeal erythema.  Eyes:     Extraocular Movements: Extraocular movements intact.     Pupils: Pupils are equal, round, and reactive to light.  Cardiovascular:     Rate and Rhythm: Regular rhythm.     Chest Wall: PMI is not displaced.     Pulses: Normal pulses.     Heart sounds: Normal heart sounds. No murmur heard. Pulmonary:     Effort: Pulmonary  effort is normal.     Breath sounds: Normal air entry. No rhonchi or rales.  Abdominal:     General: Abdomen is flat. Bowel sounds are normal. There is no distension.     Palpations: Abdomen is soft. There is no hepatomegaly, splenomegaly or mass.     Tenderness: There is no abdominal tenderness.  Musculoskeletal:        General: Normal range of motion.  Cervical back: Normal range of motion and neck supple.     Right lower leg: No edema.     Left lower leg: No edema.     Comments: Wearing lumbar brace  Skin:    General: Skin is warm and dry.  Neurological:     General: No focal deficit present.     Mental Status: He is alert and oriented to person, place, and time.     Cranial Nerves: No cranial nerve deficit.     Motor: No weakness.  Psychiatric:        Mood and Affect: Mood normal.        Behavior: Behavior normal.      No results found for any visits on 09/20/23.  Recent Results (from the past 2160 hours)  Urinalysis, Complete     Status: Abnormal   Collection Time: 06/28/23  2:23 PM  Result Value Ref Range   Specific Gravity, UA >1.030 (H) 1.005 - 1.030   pH, UA 6.0 5.0 - 7.5   Color, UA Yellow Yellow   Appearance Ur Clear Clear   Leukocytes,UA Negative Negative   Protein,UA 2+ (A) Negative/Trace   Glucose, UA Negative Negative   Ketones, UA Negative Negative   RBC, UA 1+ (A) Negative   Bilirubin, UA Negative Negative   Urobilinogen, Ur 4.0 (H) 0.2 - 1.0 mg/dL   Nitrite, UA Negative Negative   Microscopic Examination See below:   Microscopic Examination     Status: Abnormal   Collection Time: 06/28/23  2:23 PM   Urine  Result Value Ref Range   WBC, UA 0-5 0 - 5 /hpf   RBC, Urine 11-30 (A) 0 - 2 /hpf   Epithelial Cells (non renal) 0-10 0 - 10 /hpf   Casts Present (A) None seen /lpf   Cast Type Hyaline casts N/A   Mucus, UA Present (A) Not Estab.   Bacteria, UA Moderate (A) None seen/Few  Comprehensive metabolic panel     Status: Abnormal   Collection  Time: 09/06/23 10:21 AM  Result Value Ref Range   Glucose 107 (H) 70 - 99 mg/dL   BUN 16 8 - 27 mg/dL   Creatinine, Ser 4.74 0.76 - 1.27 mg/dL   eGFR 72 >25 ZD/GLO/7.56   BUN/Creatinine Ratio 16 10 - 24   Sodium 148 (H) 134 - 144 mmol/L   Potassium 4.6 3.5 - 5.2 mmol/L   Chloride 112 (H) 96 - 106 mmol/L   CO2 16 (L) 20 - 29 mmol/L   Calcium 9.4 8.6 - 10.2 mg/dL   Total Protein 7.3 6.0 - 8.5 g/dL   Albumin 4.0 3.7 - 4.7 g/dL   Globulin, Total 3.3 1.5 - 4.5 g/dL   Bilirubin Total 0.4 0.0 - 1.2 mg/dL   Alkaline Phosphatase 168 (H) 44 - 121 IU/L   AST 28 0 - 40 IU/L   ALT 28 0 - 44 IU/L  Lipid panel     Status: Abnormal   Collection Time: 09/06/23 10:21 AM  Result Value Ref Range   Cholesterol, Total 107 100 - 199 mg/dL   Triglycerides 82 0 - 149 mg/dL   HDL 30 (L) >43 mg/dL   VLDL Cholesterol Cal 17 5 - 40 mg/dL   LDL Chol Calc (NIH) 60 0 - 99 mg/dL   Chol/HDL Ratio 3.6 0.0 - 5.0 ratio    Comment:  T. Chol/HDL Ratio                                             Men  Women                               1/2 Avg.Risk  3.4    3.3                                   Avg.Risk  5.0    4.4                                2X Avg.Risk  9.6    7.1                                3X Avg.Risk 23.4   11.0       Assessment & Plan:  As per problem list. Advised to drink 4-6 16oz bottles of water/day. Problem List Items Addressed This Visit       Other   Mixed hyperlipidemia - Primary   Relevant Medications   atorvastatin (LIPITOR) 20 MG tablet   Vitamin D deficiency   Relevant Medications   Vitamin D, Cholecalciferol, 25 MCG (1000 UT) CAPS   Primary insomnia   Other Visit Diagnoses       Hypernatremia       Relevant Orders   BMP8+Anion Gap       Return in about 2 weeks (around 10/04/2023) for fu with labs prior.   Total time spent: 20 minutes  Luna Fuse, MD  09/20/2023   This document may have been prepared by [facility] Voice  Recognition software and as such may include unintentional dictation errors.

## 2023-09-21 IMAGING — CR DG CHEST 2V
1 series · 2 of 2 positions shown · non-contrast
Comparison: Radiograph 09/15/2021

CLINICAL DATA: Sepsis

EXAM:
CHEST - 2 VIEW

[Series 1: dg chest 2 view · 0.14mm/px · 2 of 2 slices shown]
[im 1/2]
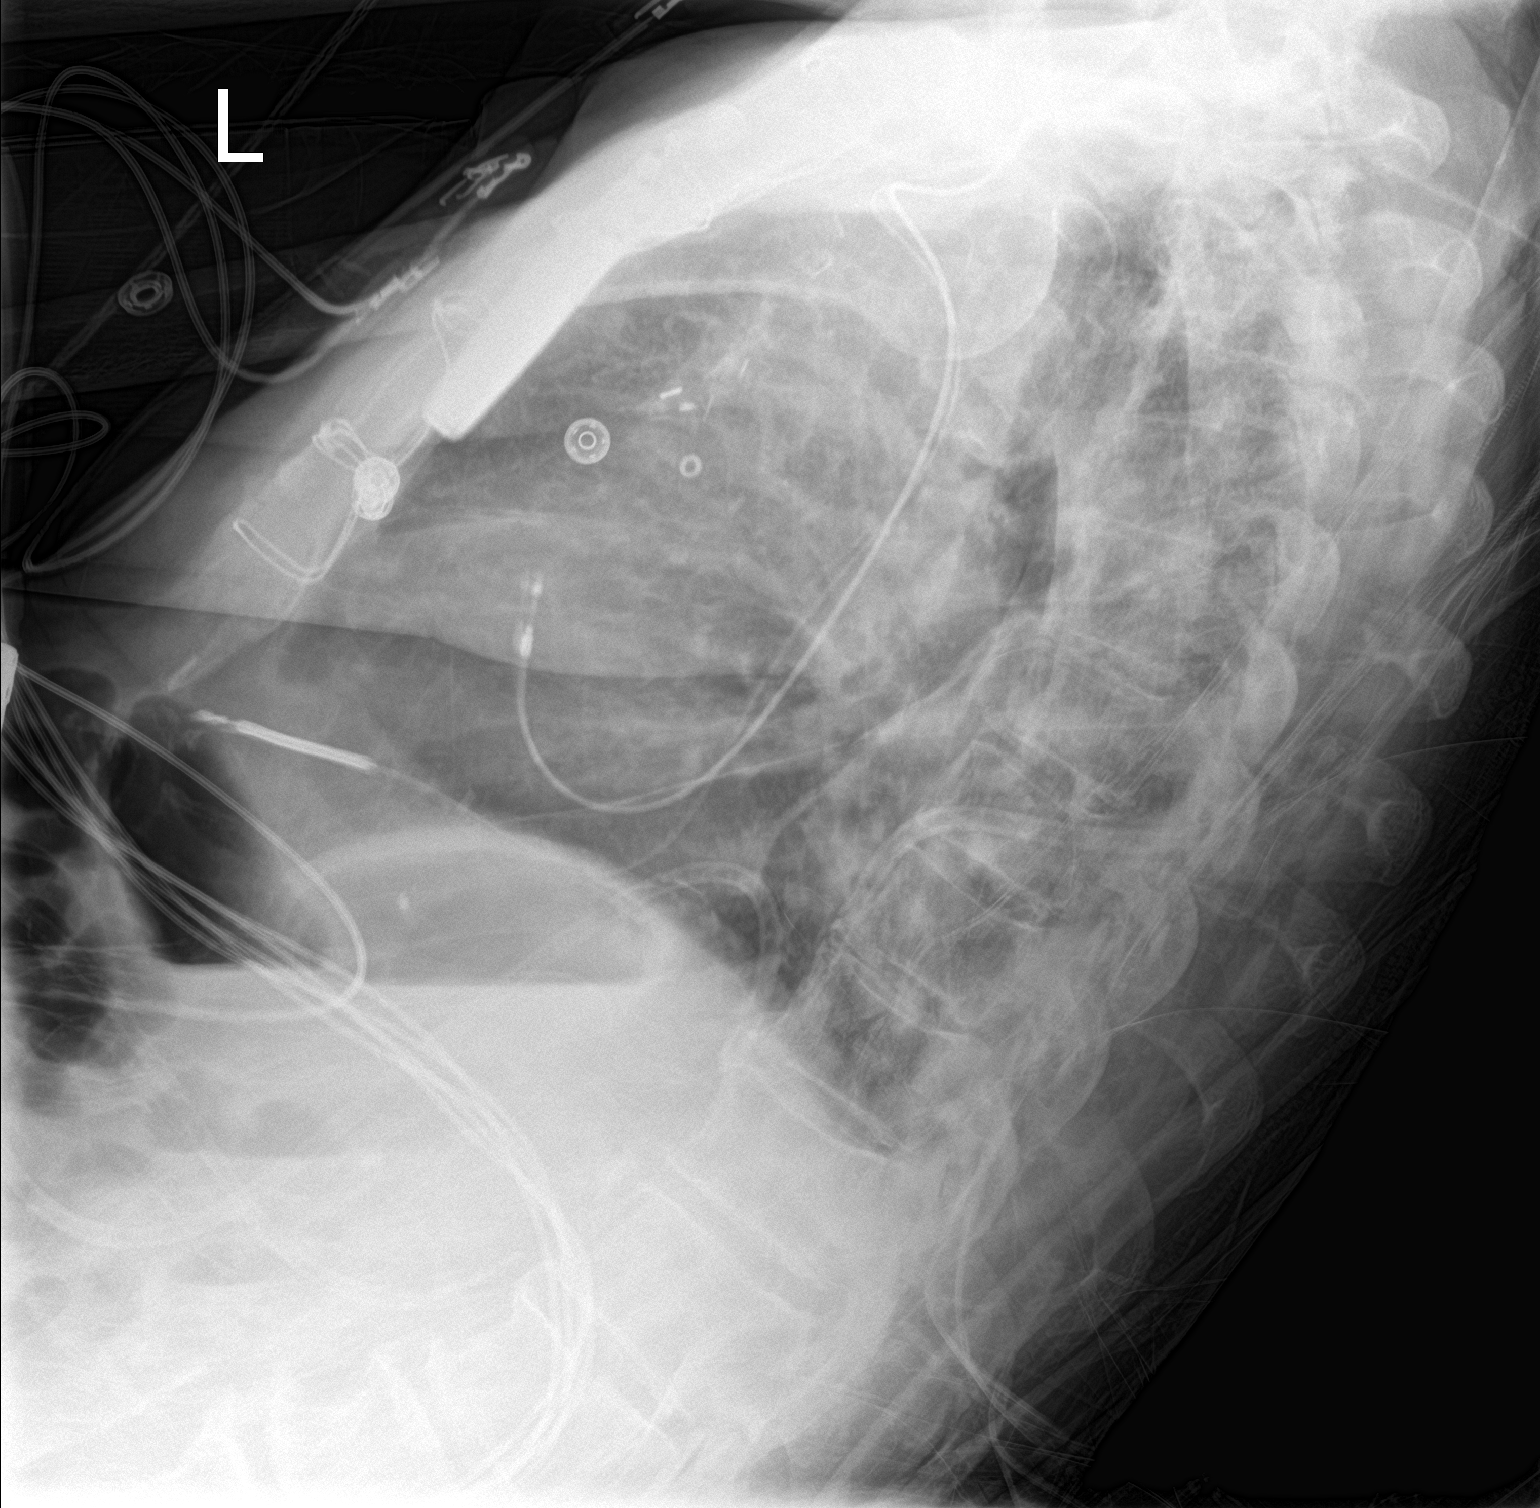
[im 2/2]
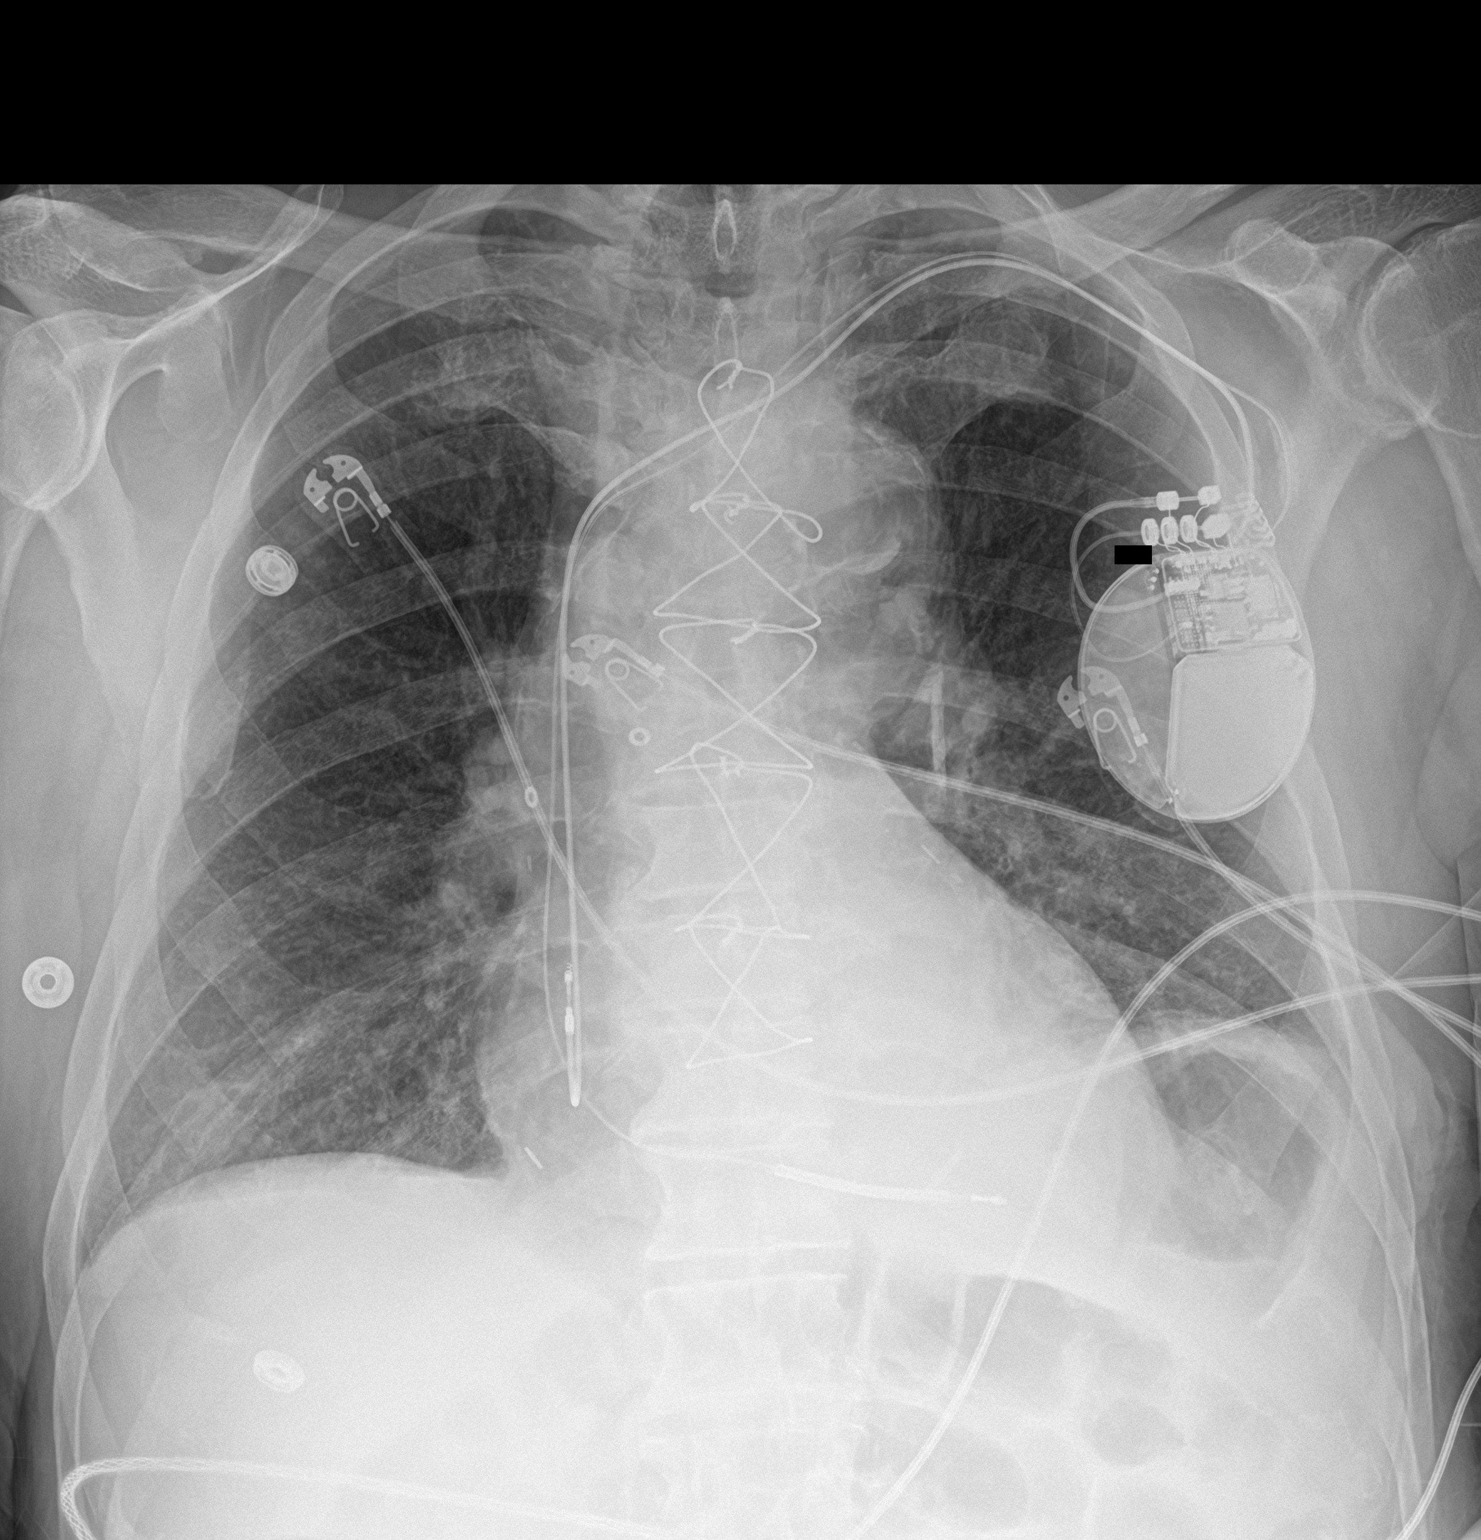

[2 of 2 positions shown; findings below may reference images not displayed]

FINDINGS: Unchanged cardiomediastinal silhouette with pacemaker/AICD leads.
Prior median sternotomy and CABG. Elevated left hemidiaphragm,
unchanged. There are mild interstitial opacities. Faint right lower
lung opacity and left medial basilar opacities, increased from
prior. No large pleural effusion. No visible pneumothorax. No acute
osseous abnormality.
IMPRESSION: Increased left medial basilar opacities and faint right peripheral
lower lung opacity, which could represent atelectasis or developing
infection.

## 2023-09-22 ENCOUNTER — Other Ambulatory Visit: Payer: Self-pay

## 2023-09-22 MED ORDER — MIRTAZAPINE 15 MG PO TABS: 15.0000 mg | ORAL_TABLET | Freq: Every day | ORAL | 1 refills | Status: DC

## 2023-09-24 IMAGING — US US EXTREM  UP VENOUS*L*
1 series · 13 of 24 positions shown · non-contrast
Comparison: None.

CLINICAL DATA: Left arm swelling



[Series 1: us venous img upper uni left (dvt) · portal-venous · 13 of 31 slices shown]
[im 1/31]
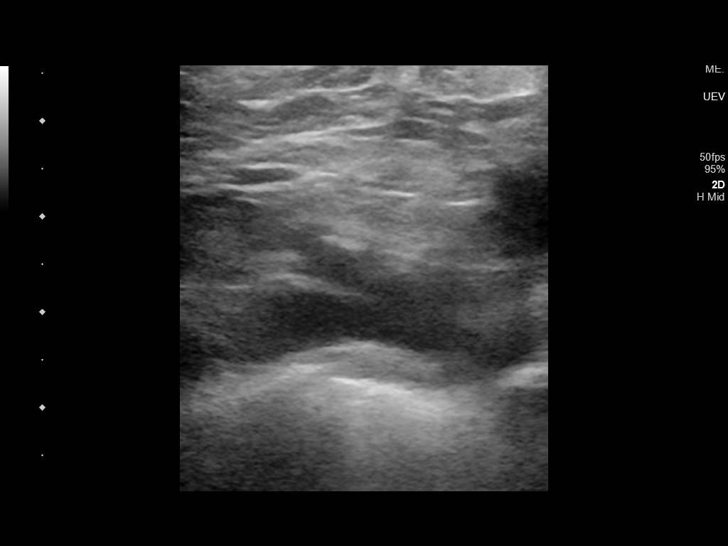
[im 3/31]
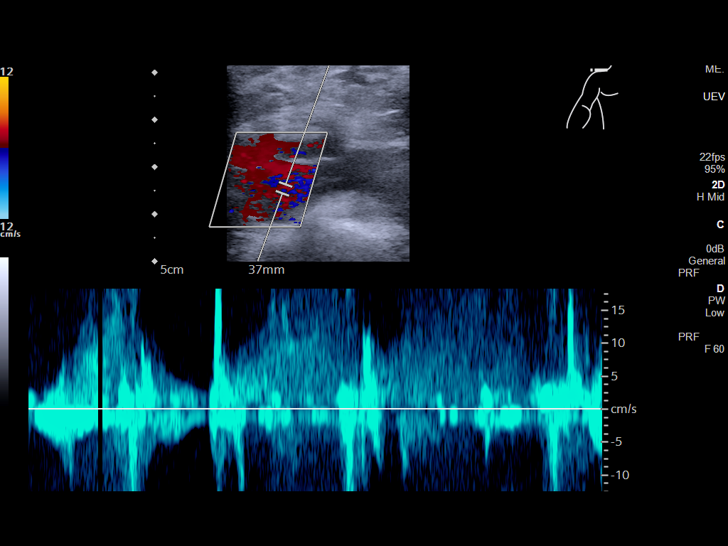
[im 6/31]
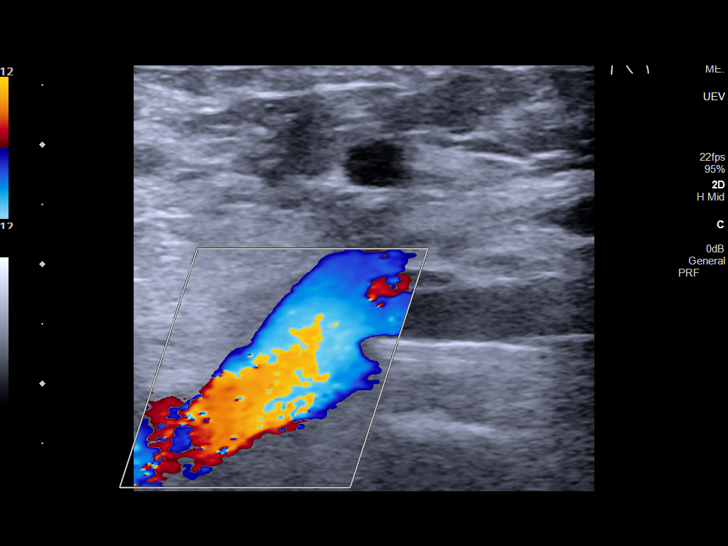
[im 8/31]
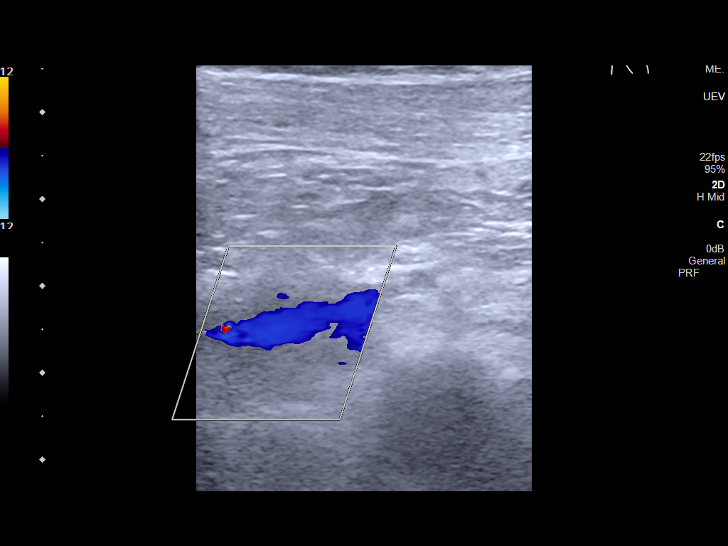
[im 11/31]
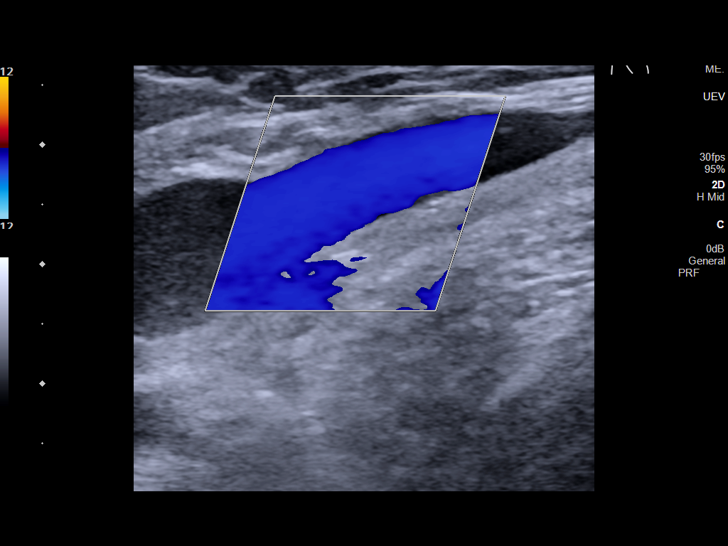
[im 14/31]
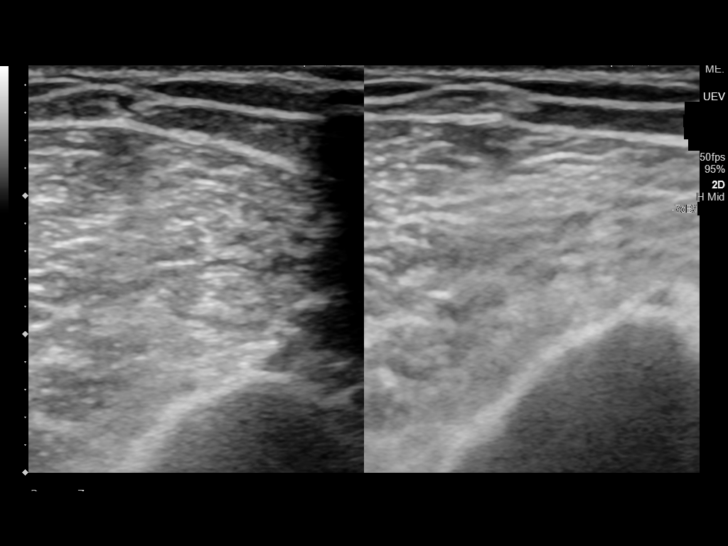
[im 16/31]
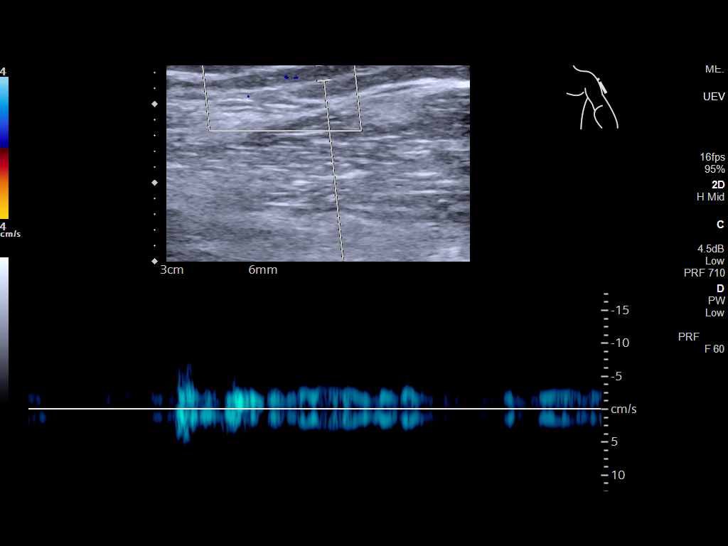
[im 17/31]
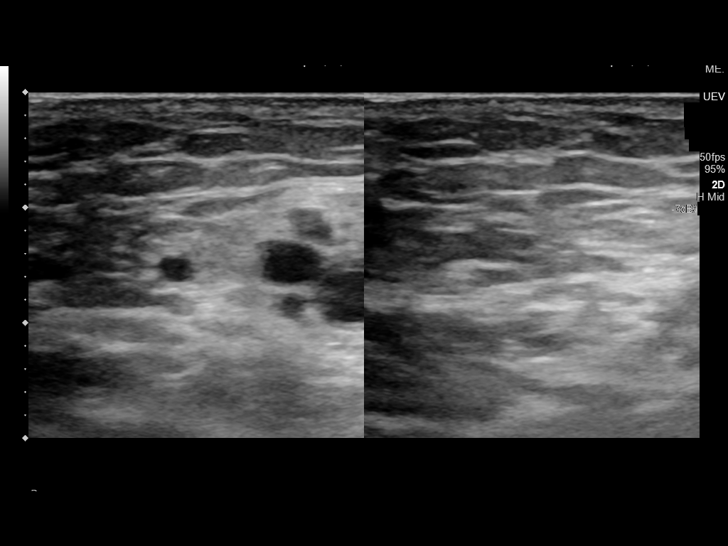
[im 20/31]
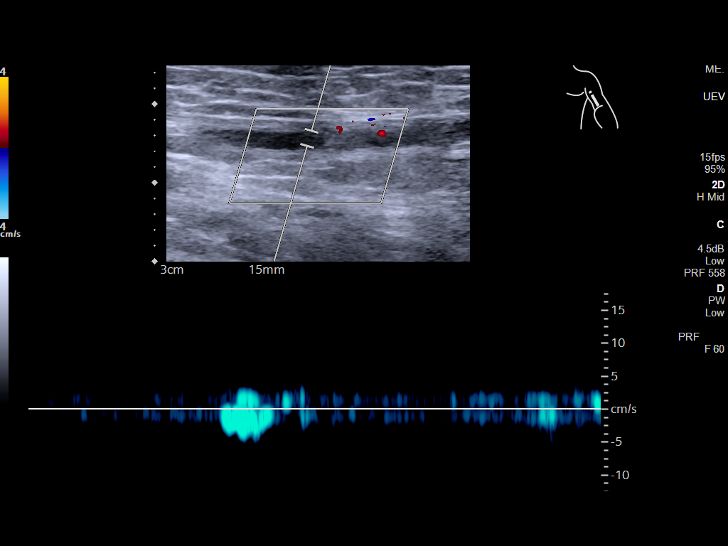
[im 23/31]
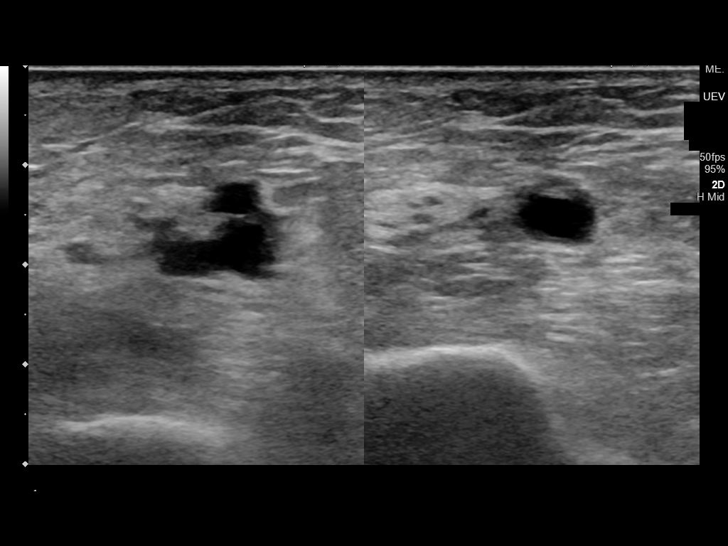
[im 25/31]
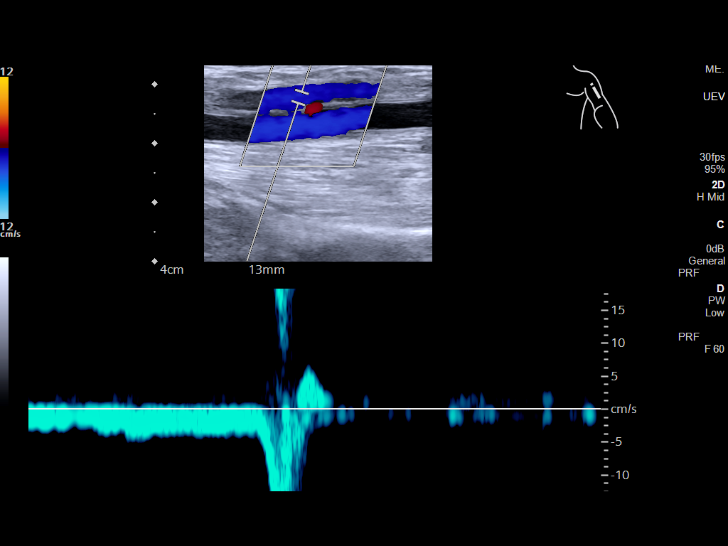
[im 28/31]
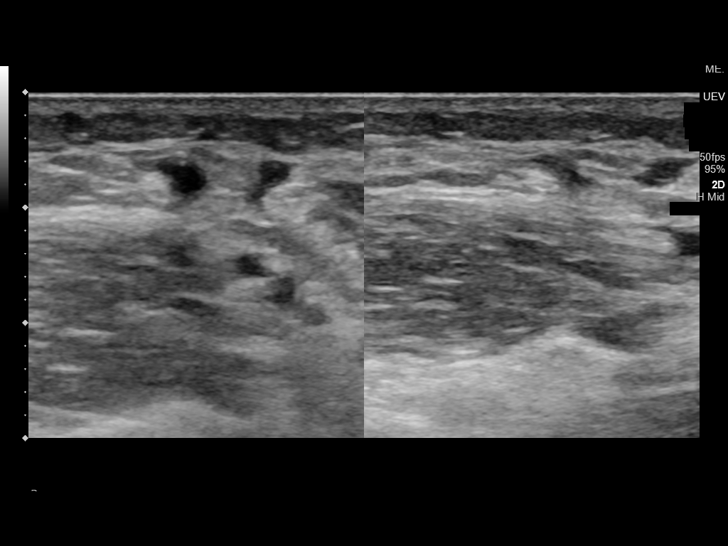
[im 31/31]
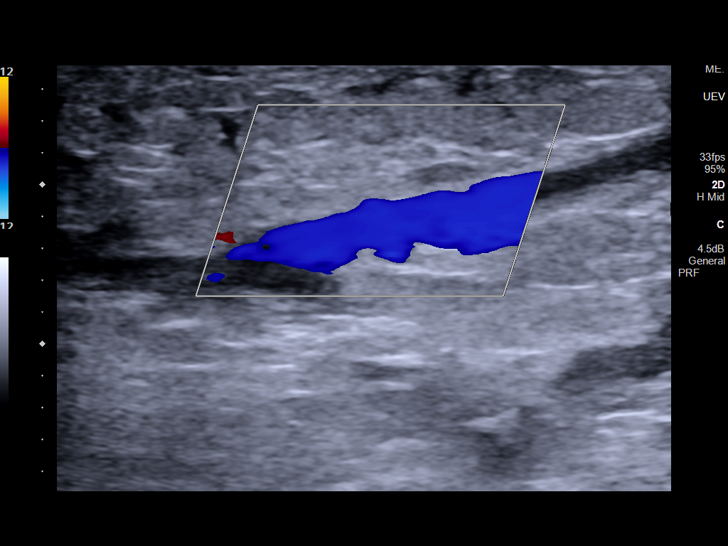

[13 of 24 positions shown; findings below may reference images not displayed]

FINDINGS: Contralateral Subclavian Vein: Respiratory phasicity is normal and
symmetric with the symptomatic side. No evidence of thrombus. Normal
compressibility.

Internal Jugular Vein: No evidence of thrombus. Normal
compressibility, respiratory phasicity and response to augmentation.

Subclavian Vein: No evidence of thrombus. Normal compressibility,
respiratory phasicity and response to augmentation.

Axillary Vein: No evidence of thrombus. Normal compressibility,
respiratory phasicity and response to augmentation.

Cephalic Vein: No evidence of thrombus. Normal compressibility,
respiratory phasicity and response to augmentation.

Basilic Vein: No evidence of thrombus. Normal compressibility,
respiratory phasicity and response to augmentation.

Brachial Veins: No evidence of thrombus. Normal compressibility,
respiratory phasicity and response to augmentation.

Radial Veins: No evidence of thrombus. Normal compressibility,
respiratory phasicity and response to augmentation.

Ulnar Veins: No evidence of thrombus. Normal compressibility,
respiratory phasicity and response to augmentation.
IMPRESSION: No evidence of DVT within the left upper extremity.

## 2023-09-26 DIAGNOSIS — G8929 Other chronic pain: Secondary | ICD-10-CM | POA: Diagnosis not present

## 2023-09-26 DIAGNOSIS — M545 Low back pain, unspecified: Secondary | ICD-10-CM | POA: Diagnosis not present

## 2023-09-26 DIAGNOSIS — Z79891 Long term (current) use of opiate analgesic: Secondary | ICD-10-CM | POA: Diagnosis not present

## 2023-09-26 DIAGNOSIS — M47817 Spondylosis without myelopathy or radiculopathy, lumbosacral region: Secondary | ICD-10-CM | POA: Diagnosis not present

## 2023-09-26 IMAGING — XA IR TUNNELED CV CATH W/O PORT/PUMP >5YO
1 series · 2 of 2 positions shown · non-contrast
Comparison: none

INDICATION: 80-year-old male with MSSA bacteremia presumably due to septic
superficial thrombophlebitis of the right cephalic vein. He presents
for placement of a tunneled central venous catheter to facilitate
outpatient intravenous antibiotic therapy.

[Series 1: interv standard · 2 of 2 slices shown]
[im 1/2]
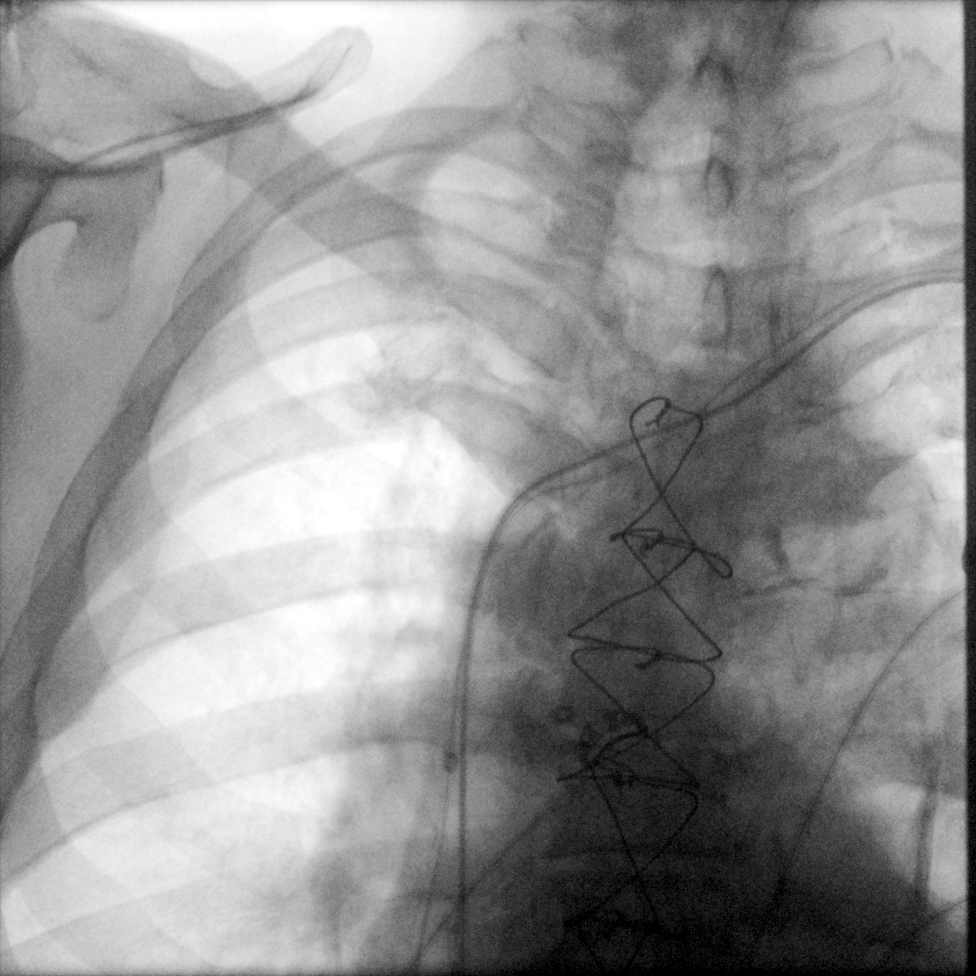
[im 2/2]
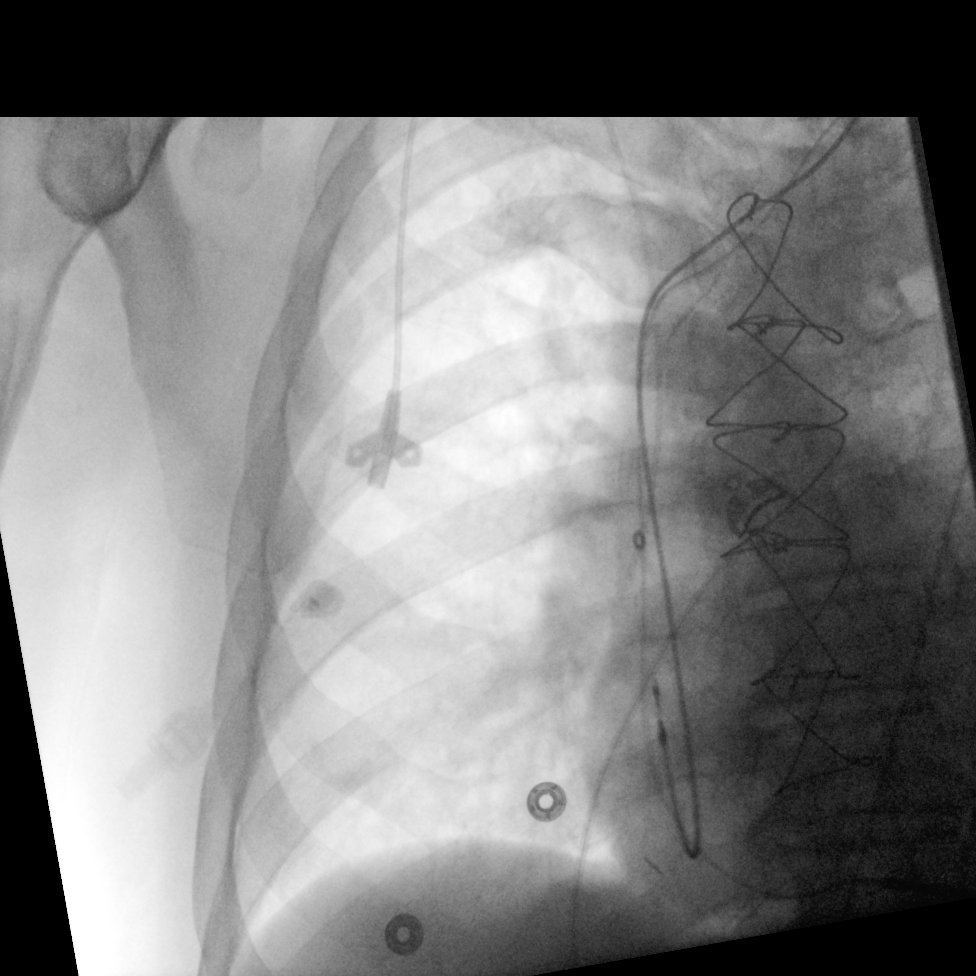

[2 of 2 positions shown; findings below may reference images not displayed]

EXAM:
TUNNELED CV CATH

MEDICATIONS:
None.

ANESTHESIA/SEDATION:
Moderate (conscious) sedation was employed during this procedure. A
total of Versed 1 mg and Fentanyl 50 mcg was administered
intravenously.

Moderate Sedation Time: 10 minutes. The patient's level of
consciousness and vital signs were monitored continuously by
radiology nursing throughout the procedure under my direct
supervision.

FLUOROSCOPY TIME:  1.1 mGy, air kerma

COMPLICATIONS:
None immediate.

PROCEDURE:
Informed written consent was obtained from the patient after a
thorough discussion of the procedural risks, benefits and
alternatives. All questions were addressed. Maximal Sterile Barrier
Technique was utilized including caps, mask, sterile gowns, sterile
gloves, sterile drape, hand hygiene and skin antiseptic. A timeout
was performed prior to the initiation of the procedure.

The right internal jugular vein was interrogated with ultrasound and
found to be widely patent. An image was obtained and stored for the
medical record. Local anesthesia was attained by infiltration with
1% lidocaine. A small dermatotomy was made. Under real-time
sonographic guidance, the vessel was punctured with a 21 gauge
micropuncture needle. Using standard technique, the initial micro
needle was exchanged over a 0.018 micro wire for a transitional 4
French micro sheath. The micro sheath was then exchanged for a
peel-away sheath. A suitable skin exit site inferior to the clavicle
was identified. Local anesthesia was again attained by infiltration
with 1% lidocaine. A small dermatotomy was made. A single lumen
tunneled power line was then tunneled from the skin exit site to the
dermatotomy overlying the venous access site. The catheter was then
cut to length and advanced through the peel-away sheath. The tip was
positioned at the superior cavoatrial junction under fluoroscopic
imaging. Images were obtained and stored for the medical record.

The catheter was secured to the skin with 0 Prolene suture before
being aspirated, flushed and capped. Sterile bandages were placed.
The catheter flushes and aspirates easily.
IMPRESSION: Successful placement of a right IJ tunneled single-lumen power line.
Catheter tip is at the superior cavoatrial junction and the device
is ready for use.

## 2023-10-02 ENCOUNTER — Other Ambulatory Visit

## 2023-10-02 DIAGNOSIS — E870 Hyperosmolality and hypernatremia: Secondary | ICD-10-CM | POA: Diagnosis not present

## 2023-10-03 LAB — BMP8+ANION GAP
Anion Gap: 14 mmol/L (ref 10.0–18.0)
BUN/Creatinine Ratio: 16 (ref 10–24)
BUN: 18 mg/dL (ref 8–27)
CO2: 23 mmol/L (ref 20–29)
Calcium: 9.7 mg/dL (ref 8.6–10.2)
Chloride: 103 mmol/L (ref 96–106)
Creatinine, Ser: 1.16 mg/dL (ref 0.76–1.27)
Glucose: 81 mg/dL (ref 70–99)
Potassium: 4.5 mmol/L (ref 3.5–5.2)
Sodium: 140 mmol/L (ref 134–144)
eGFR: 62 mL/min/{1.73_m2} (ref >59)

## 2023-10-04 ENCOUNTER — Ambulatory Visit (INDEPENDENT_AMBULATORY_CARE_PROVIDER_SITE_OTHER): Payer: Medicare HMO | Admitting: Internal Medicine

## 2023-10-04 ENCOUNTER — Encounter: Payer: Self-pay | Admitting: Internal Medicine

## 2023-10-04 VITALS — BP 123/73 | HR 58 | Temp 98.2°F | Ht 72.0 in | Wt 162.0 lb

## 2023-10-04 DIAGNOSIS — E782 Mixed hyperlipidemia: Secondary | ICD-10-CM | POA: Diagnosis not present

## 2023-10-04 DIAGNOSIS — E870 Hyperosmolality and hypernatremia: Secondary | ICD-10-CM | POA: Diagnosis not present

## 2023-10-04 DIAGNOSIS — Z0130 Encounter for examination of blood pressure without abnormal findings: Secondary | ICD-10-CM

## 2023-10-04 DIAGNOSIS — E559 Vitamin D deficiency, unspecified: Secondary | ICD-10-CM

## 2023-10-04 NOTE — Progress Notes (Signed)
 Established Patient Office Visit  Subjective:  Patient ID: Duane Owens, male    DOB: [birth year]  Age: 83 y.o. MRN: [id number]  Chief Complaint  Patient presents with   Follow-up    2 week follow up labs prior    No new complaints, here for lab review. BMP reviewed and notable for normalization of sodium to which he admits compliance with higher fluid intake.     No other concerns at this time.   Past Medical History:  Diagnosis Date   Anemia    Arrhythmia    atrial fibrillation   Atherosclerosis    Cancer [facility])    prostate   CHF (congestive heart failure) (HCC)    Coronary artery disease    Hypercholesteremia    Hyperlipemia    Hypertension    Septicemia (HCC)    Spinal stenosis    Varicose vein    Venous insufficiency     Past Surgical History:  Procedure Laterality Date   BACK SURGERY     CYSTOSCOPY WITH DIRECT VISION INTERNAL URETHROTOMY N/A 02/02/2016   Procedure: CYSTOSCOPY WITH DIRECT VISION INTERNAL URETHROTOMY;  Surgeon: Orson Ape, MD;  Location: [facility] ORS;  Service: Urology;  Laterality: N/A;   HOLMIUM LASER APPLICATION N/A 02/02/2016   Procedure: HOLMIUM LASER APPLICATION;  Surgeon: Orson Ape, MD;  Location: [facility] ORS;  Service: Urology;  Laterality: N/A;   IR PERC TUN PERIT CATH WO PORT [facility]&I /IMAG  09/22/2021   IR REMOVAL TUN CV CATH W/O FL  10/15/2021   PERIPHERAL VASCULAR CATHETERIZATION N/A 08/11/2015   Procedure: Abdominal Aortogram w/Lower Extremity;  Surgeon: Renford Dills, MD;  Location: [facility];  Service: Cardiovascular;  Laterality: N/A;   PERIPHERAL VASCULAR CATHETERIZATION  08/11/2015   Procedure: Lower Extremity Intervention;  Surgeon: Renford Dills, MD;  Location: [facility] LAB;  Service: Cardiovascular;;   TEE WITHOUT CARDIOVERSION N/A 09/20/2021   Procedure: TRANSESOPHAGEAL ECHOCARDIOGRAM (TEE);  Surgeon: Debbe Odea, MD;  Location: [facility] ORS;  Service: Cardiovascular;  Laterality: N/A;    TONSILLECTOMY      Social History   Socioeconomic History   Marital status: Widowed    Spouse name: Not on file   Number of children: Not on file   Years of education: Not on file   Highest education level: Not on file  Occupational History   Not on file  Tobacco Use   Smoking status: Former    Current packs/day: 0.50    Average packs/day: 0.5 packs/day for 5.0 years (2.5 ttl pk-yrs)    Types: Cigarettes   Smokeless tobacco: Never   Tobacco comments:    Has called [place] [facility] and ordered nicotene patches. Will Quit when patches start  Vaping Use   Vaping status: Never Used  Substance and Sexual Activity   Alcohol use: No   Drug use: No   Sexual activity: Not on file  Other Topics Concern   Not on file  Social History Narrative   Not on file   Social Drivers of Health   Financial Resource Strain: Not on file  Food Insecurity: No Food Insecurity (05/03/2022)   Hunger Vital Sign    Worried About Running Out of Food in the Last Year: Never true    Ran Out of Food in the Last Year: Never true  Transportation Needs: No Transportation Needs (05/03/2022)   PRAPARE - Administrator, Civil Service (Medical): No    Lack of Transportation (Non-Medical): No  Physical Activity: Not on file  Stress: Not on file  Social Connections: Not on file  Intimate Partner Violence: Not on file    Family History  Problem Relation Age of Onset   Heart failure Mother    Diabetes Brother    COPD Brother     Allergies  Allergen Reactions   Amiodarone Other (See Comments)    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  Would consider alternative agents if possible.    Outpatient Medications Prior to Visit  Medication Sig   allopurinol (ZYLOPRIM) 300 MG tablet Take 1.5 tablets (450 mg total) by mouth daily.   apixaban (ELIQUIS) 5 MG TABS tablet Take 5 mg by mouth 2 (two) times daily.   atorvastatin (LIPITOR) 20 MG tablet Take 1 tablet (20  mg total) by mouth at bedtime.   HYDROcodone-acetaminophen (NORCO) 7.5-325 MG tablet Take 1 tablet by mouth 3 (three) times daily as needed.   metoprolol succinate (TOPROL-XL) 50 MG 24 hr tablet Take 50 mg by mouth daily.   mirtazapine (REMERON) 15 MG tablet Take 1 tablet (15 mg total) by mouth at bedtime.   nitroGLYCERIN (NITROSTAT) 0.4 MG SL tablet Place under the tongue.   pantoprazole (PROTONIX) 40 MG tablet Take 1 tablet (40 mg total) by mouth daily.   traZODone (DESYREL) 50 MG tablet Take 1 tablet (50 mg total) by mouth at bedtime as needed for sleep.   valsartan (DIOVAN) 40 MG tablet Take 0.5 tablets by mouth 2 (two) times daily.   Vitamin D, Cholecalciferol, 25 MCG (1000 UT) CAPS Take 1 capsule by mouth daily.   No facility-administered medications prior to visit.    Review of Systems  Constitutional: Negative.   HENT: Negative.    Eyes: Negative.   Respiratory:  Positive for shortness of breath.   Cardiovascular: Negative.   Gastrointestinal: Negative.   Genitourinary: Negative.   Skin: Negative.   Neurological: Negative.   Endo/Heme/Allergies: Negative.        Objective:   BP 123/73   Pulse (!) 58   Temp 98.2 F (36.8 C)   Ht 6' (1.829 m)   Wt 162 lb (73.5 kg)   SpO2 99%   BMI 21.97 kg/m   Vitals:   10/04/23 0841  BP: 123/73  Pulse: (!) 58  Temp: 98.2 F (36.8 C)  Height: 6' (1.829 m)  Weight: 162 lb (73.5 kg)  SpO2: 99%  BMI (Calculated): 21.97    Physical Exam Vitals reviewed.  Constitutional:      Appearance: Normal appearance.  HENT:     Head: Normocephalic.     Left Ear: There is no impacted cerumen.     Nose: Nose normal.     Mouth/Throat:     Mouth: Mucous membranes are moist.     Pharynx: No posterior oropharyngeal erythema.  Eyes:     Extraocular Movements: Extraocular movements intact.     Pupils: Pupils are equal, round, and reactive to light.  Cardiovascular:     Rate and Rhythm: Regular rhythm.     Chest Wall: PMI is not  displaced.     Pulses: Normal pulses.     Heart sounds: Normal heart sounds. No murmur heard. Pulmonary:     Effort: Pulmonary effort is normal.     Breath sounds: Normal air entry. No rhonchi or rales.  Abdominal:     General: Abdomen is flat. Bowel sounds are normal. There is no distension.     Palpations: Abdomen is soft. There is  no hepatomegaly, splenomegaly or mass.     Tenderness: There is no abdominal tenderness.  Musculoskeletal:        General: Normal range of motion.     Cervical back: Normal range of motion and neck supple.     Right lower leg: No edema.     Left lower leg: No edema.     Comments: Wearing lumbar brace  Skin:    General: Skin is warm and dry.  Neurological:     General: No focal deficit present.     Mental Status: He is alert and oriented to person, place, and time.     Cranial Nerves: No cranial nerve deficit.     Motor: No weakness.  Psychiatric:        Mood and Affect: Mood normal.        Behavior: Behavior normal.      No results found for any visits on 10/04/23.  Recent Results (from the past 2160 hours)  Comprehensive metabolic panel     Status: Abnormal   Collection Time: 09/06/23 10:21 AM  Result Value Ref Range   Glucose 107 (H) 70 - 99 mg/dL   BUN 16 8 - 27 mg/dL   Creatinine, Ser 1.61 0.76 - 1.27 mg/dL   eGFR 72 >09 UE/AVW/0.98   BUN/Creatinine Ratio 16 10 - 24   Sodium 148 (H) 134 - 144 mmol/L   Potassium 4.6 3.5 - 5.2 mmol/L   Chloride 112 (H) 96 - 106 mmol/L   CO2 16 (L) 20 - 29 mmol/L   Calcium 9.4 8.6 - 10.2 mg/dL   Total Protein 7.3 6.0 - 8.5 g/dL   Albumin 4.0 3.7 - 4.7 g/dL   Globulin, Total 3.3 1.5 - 4.5 g/dL   Bilirubin Total 0.4 0.0 - 1.2 mg/dL   Alkaline Phosphatase 168 (H) 44 - 121 IU/L   AST 28 0 - 40 IU/L   ALT 28 0 - 44 IU/L  Lipid panel     Status: Abnormal   Collection Time: 09/06/23 10:21 AM  Result Value Ref Range   Cholesterol, Total 107 100 - 199 mg/dL   Triglycerides 82 0 - 149 mg/dL   HDL 30 (L)  >11 mg/dL   VLDL Cholesterol Cal 17 5 - 40 mg/dL   LDL Chol Calc (NIH) 60 0 - 99 mg/dL   Chol/HDL Ratio 3.6 0.0 - 5.0 ratio    Comment:                                   T. Chol/HDL Ratio                                             Men  Women                               1/2 Avg.Risk  3.4    3.3                                   Avg.Risk  5.0    4.4  2X Avg.Risk  9.6    7.1                                3X Avg.Risk 23.4   11.0   BMP8+Anion Gap     Status: None   Collection Time: 10/02/23  9:55 AM  Result Value Ref Range   Glucose 81 70 - 99 mg/dL   BUN 18 8 - 27 mg/dL   Creatinine, Ser 7.82 0.76 - 1.27 mg/dL   eGFR 62 >95 AO/ZHY/8.65   BUN/Creatinine Ratio 16 10 - 24   Sodium 140 134 - 144 mmol/L   Potassium 4.5 3.5 - 5.2 mmol/L   Chloride 103 96 - 106 mmol/L   CO2 23 20 - 29 mmol/L   Anion Gap 14.0 10.0 - 18.0 mmol/L   Calcium 9.7 8.6 - 10.2 mg/dL      Assessment & Plan:  As per problem list.  Problem List Items Addressed This Visit       Other   Mixed hyperlipidemia   Vitamin D deficiency   Relevant Orders   Vitamin D (25 hydroxy)   Other Visit Diagnoses       Hypernatremia    -  Primary       Return in about 11 weeks (around 12/20/2023) for fu with labs prior.   Total time spent: 20 minutes  Luna Fuse, MD  10/04/2023   This document may have been prepared by [facility] Voice Recognition software and as such may include unintentional dictation errors.

## 2023-10-16 DIAGNOSIS — I5042 Chronic combined systolic (congestive) and diastolic (congestive) heart failure: Secondary | ICD-10-CM | POA: Diagnosis not present

## 2023-10-16 DIAGNOSIS — Z4502 Encounter for adjustment and management of automatic implantable cardiac defibrillator: Secondary | ICD-10-CM | POA: Diagnosis not present

## 2023-10-24 DIAGNOSIS — G8929 Other chronic pain: Secondary | ICD-10-CM | POA: Diagnosis not present

## 2023-10-24 DIAGNOSIS — M47817 Spondylosis without myelopathy or radiculopathy, lumbosacral region: Secondary | ICD-10-CM | POA: Diagnosis not present

## 2023-10-24 DIAGNOSIS — Z79891 Long term (current) use of opiate analgesic: Secondary | ICD-10-CM | POA: Diagnosis not present

## 2023-10-24 DIAGNOSIS — Z95810 Presence of automatic (implantable) cardiac defibrillator: Secondary | ICD-10-CM | POA: Diagnosis not present

## 2023-10-24 DIAGNOSIS — Z4502 Encounter for adjustment and management of automatic implantable cardiac defibrillator: Secondary | ICD-10-CM | POA: Diagnosis not present

## 2023-10-24 DIAGNOSIS — I255 Ischemic cardiomyopathy: Secondary | ICD-10-CM | POA: Diagnosis not present

## 2023-10-24 DIAGNOSIS — M545 Low back pain, unspecified: Secondary | ICD-10-CM | POA: Diagnosis not present

## 2023-11-03 ENCOUNTER — Emergency Department
Admission: EM | Admit: 2023-11-03 | Discharge: 2023-11-03 | Disposition: A | Discharge status: Home / Self Care | Attending: Emergency Medicine | Admitting: Emergency Medicine

## 2023-11-03 ENCOUNTER — Emergency Department

## 2023-11-03 ENCOUNTER — Other Ambulatory Visit: Payer: Self-pay

## 2023-11-03 DIAGNOSIS — R600 Localized edema: Secondary | ICD-10-CM | POA: Diagnosis not present

## 2023-11-03 DIAGNOSIS — I509 Heart failure, unspecified: Secondary | ICD-10-CM | POA: Diagnosis not present

## 2023-11-03 DIAGNOSIS — R7989 Other specified abnormal findings of blood chemistry: Secondary | ICD-10-CM | POA: Diagnosis not present

## 2023-11-03 DIAGNOSIS — Z950 Presence of cardiac pacemaker: Secondary | ICD-10-CM | POA: Diagnosis not present

## 2023-11-03 DIAGNOSIS — I5033 Acute on chronic diastolic (congestive) heart failure: Secondary | ICD-10-CM | POA: Insufficient documentation

## 2023-11-03 DIAGNOSIS — Z7901 Long term (current) use of anticoagulants: Secondary | ICD-10-CM | POA: Diagnosis not present

## 2023-11-03 DIAGNOSIS — I77819 Aortic ectasia, unspecified site: Secondary | ICD-10-CM | POA: Diagnosis not present

## 2023-11-03 DIAGNOSIS — R0602 Shortness of breath: Secondary | ICD-10-CM | POA: Diagnosis present

## 2023-11-03 DIAGNOSIS — I110 Hypertensive heart disease with heart failure: Secondary | ICD-10-CM | POA: Diagnosis not present

## 2023-11-03 LAB — BASIC METABOLIC PANEL WITH GFR
Anion gap: 7 (ref 5–15)
BUN: 24 mg/dL — ABNORMAL HIGH (ref 8–23)
CO2: 24 mmol/L (ref 22–32)
Calcium: 8.5 mg/dL — ABNORMAL LOW (ref 8.9–10.3)
Chloride: 107 mmol/L (ref 98–111)
Creatinine, Ser: 0.97 mg/dL (ref 0.61–1.24)
GFR, Estimated: >60 mL/min (ref >60)
Glucose, Bld: 104 mg/dL — ABNORMAL HIGH (ref 70–99)
Potassium: 3.8 mmol/L (ref 3.5–5.1)
Sodium: 138 mmol/L (ref 135–145)

## 2023-11-03 LAB — TROPONIN I (HIGH SENSITIVITY): Troponin I (High Sensitivity): 16 ng/L (ref <18)

## 2023-11-03 LAB — BRAIN NATRIURETIC PEPTIDE: B Natriuretic Peptide: 1322.8 pg/mL — ABNORMAL HIGH (ref 0.0–100.0)

## 2023-11-03 MED ORDER — FUROSEMIDE 20 MG PO TABS: 20.0000 mg | ORAL_TABLET | Freq: Every day | ORAL | 0 refills | Status: DC

## 2023-11-03 MED ORDER — FUROSEMIDE 40 MG PO TABS
40.0000 mg | ORAL_TABLET | Freq: Once | ORAL | Status: AC
  Administered 2023-11-03: 40 mg via ORAL
  Filled 2023-11-03: qty 1

## 2023-11-03 MED ORDER — SPIRONOLACTONE 25 MG PO TABS: 25.0000 mg | ORAL_TABLET | Freq: Every day | ORAL | 0 refills | Status: AC

## 2023-11-03 MED ORDER — FUROSEMIDE 10 MG/ML IJ SOLN
40.0000 mg | Freq: Once | INTRAMUSCULAR | Status: DC
Start: 2023-11-03 — End: 2023-11-03

## 2023-11-03 NOTE — ED Notes (Signed)
 Reviewed D/C information with the patient, pt verbalized understanding. No additional concerns at this time.

## 2023-11-03 NOTE — ED Notes (Addendum)
 Blue top sent down  Pt refusing wheel chair in triage

## 2023-11-03 NOTE — ED Provider Notes (Signed)
 [facility] Provider Note    Event Date/Time   First MD Initiated Contact with Patient 11/03/23 1907     (approximate)   History   Shortness of Breath   HPI Duane Owens is a 83 y.o. male with history of HFpEF, HTN, HLD, A-fib on Eliquis presenting today for shortness of breath.  Patient states he has been out of his spironolactone for the past 2 months.  He is here with daughter who report increasing shortness of breath over the past several weeks.  Mostly having dyspnea on exertion but none present at rest.  Otherwise denies chest pain, cough, congestion, fever, chills.  Mild amount of swelling noted to the lower extremities.  Chart review: Most recent echo in 2023 with EF of 55 to 60%.     Physical Exam   Triage Vital Signs: ED Triage Vitals  Encounter Vitals Group     BP 11/03/23 1745 (!) 152/72     Systolic BP Percentile --      Diastolic BP Percentile --      Pulse Rate 11/03/23 1745 63     Resp 11/03/23 1745 (!) 22     Temp 11/03/23 1745 98 F (36.7 C)     Temp Source 11/03/23 1745 Oral     SpO2 11/03/23 1742 100 %     Weight --      Height 11/03/23 1742 6' (1.829 m)     Head Circumference --      Peak Flow --      Pain Score 11/03/23 1742 0     Pain Loc --      Pain Education --      Exclude from Growth Chart --     Most recent vital signs: Vitals:   11/03/23 1742 11/03/23 1745  BP:  (!) 152/72  Pulse:  63  Resp:  (!) 22  Temp:  98 F (36.7 C)  SpO2: 100%    Physical Exam: I have reviewed the vital signs and nursing notes. General: Awake, alert, no acute distress.  Nontoxic appearing. Head:  Atraumatic, normocephalic.   ENT:  EOM intact, PERRL. Oral mucosa is pink and moist with no lesions. Neck: Neck is supple with full range of motion, No meningeal signs. Cardiovascular:  RRR, No murmurs. Peripheral pulses palpable and equal bilaterally. Respiratory:  Symmetrical chest wall expansion.  No rhonchi, rales, or wheezes.   Good air movement throughout.  No use of accessory muscles.   Musculoskeletal:  No cyanosis.  Trace pretibial edema bilaterally. Moving extremities with full ROM Abdomen:  Soft, nontender, nondistended. Neuro:  GCS 15, moving all four extremities, interacting appropriately. Speech clear. Psych:  Calm, appropriate.   Skin:  Warm, dry, no rash.    ED Results / Procedures / Treatments   Labs (all labs ordered are listed, but only abnormal results are displayed) Labs Reviewed  BASIC METABOLIC PANEL WITH GFR - Abnormal; Notable for the following components:      Result Value   Glucose, Bld 104 (*)    BUN 24 (*)    Calcium 8.5 (*)    All other components within normal limits  BRAIN NATRIURETIC PEPTIDE - Abnormal; Notable for the following components:   B Natriuretic Peptide 1,322.8 (*)    All other components within normal limits  CBC  TROPONIN I (HIGH SENSITIVITY)     EKG My EKG interpretation: Rate of 64, sinus rhythm with occasional PVC.  Right bundle branch block.  No acute  ST elevations or depressions   RADIOLOGY Independently interpreted chest x-ray with no acute pathology   PROCEDURES:  Critical Care performed: No  Procedures   MEDICATIONS ORDERED IN ED: Medications  furosemide (LASIX) injection 40 mg (has no administration in time range)     IMPRESSION / MDM / ASSESSMENT AND PLAN / ED COURSE  I reviewed the triage vital signs and the nursing notes.                              Differential diagnosis includes, but is not limited to, CHF exacerbation, pneumonia, pneumothorax  Patient's presentation is most consistent with exacerbation of chronic illness.  Patient is an 83 year old male presenting today for dyspnea on exertion with history of CHF and been off his spironolactone for the past 2 months.  He has trace pretibial edema but otherwise no tachypnea on exam.  No hypoxia on room air.  Otherwise looks very comfortable.  Laboratory workup does show  elevation in his BNP at 1322.  Troponin within normal limits and no chest pain with no indication for repeat.  EKG reassuring.  BMP with no AKI.  Chest x-ray unremarkable.  He is on a blood thinner so very little concern for PE.  Symptoms seem most consistent with mild CHF exacerbation in the setting of being off his normal diuretic.  Will give one-time dose of IV Lasix here and then plan for discharge with refill of his spironolactone as well as 5 days of Lasix.  Considered admission but has no hypoxia or tachypnea and discussed with patient and his daughter at bedside feel comfortable with this plan outpatient.  Given strict return precautions for worsening shortness of breath.  He has cardiology follow-up in 2 weeks.  The patient is on the cardiac monitor to evaluate for evidence of arrhythmia and/or significant heart rate changes.     FINAL CLINICAL IMPRESSION(S) / ED DIAGNOSES   Final diagnoses:  Acute on chronic congestive heart failure, unspecified heart failure type (HCC)     Rx / DC Orders   ED Discharge Orders          Ordered    spironolactone (ALDACTONE) 25 MG tablet  Daily        11/03/23 1921    furosemide (LASIX) 20 MG tablet  Daily        11/03/23 1921             Note:  This document was prepared using Dragon voice recognition software and may include unintentional dictation errors.   Janith Lima, MD 11/03/23 [phone]

## 2023-11-03 NOTE — ED Notes (Signed)
Pt declined IV at this time

## 2023-11-03 NOTE — ED Triage Notes (Signed)
 Pt to ed from home via SOB all day. Pt denies any other symptoms. Pt is breathing rapidly in triage but ambulatory. Pt is a current smoker. Pt appears pale around the mouth.

## 2023-11-03 NOTE — Discharge Instructions (Addendum)
 I have sent refills of your medications to the pharmacy to help continue pulling fluid off your body.  I have also sent a short course of Lasix to speed this up as well.  Please return if the shortness of breath does not improve in the next several days.  Otherwise follow-up with your cardiology team as planned in the next couple weeks.

## 2023-11-16 DIAGNOSIS — I5022 Chronic systolic (congestive) heart failure: Secondary | ICD-10-CM | POA: Diagnosis not present

## 2023-11-16 DIAGNOSIS — I255 Ischemic cardiomyopathy: Secondary | ICD-10-CM | POA: Diagnosis not present

## 2023-11-16 DIAGNOSIS — I739 Peripheral vascular disease, unspecified: Secondary | ICD-10-CM | POA: Diagnosis not present

## 2023-11-16 DIAGNOSIS — Z720 Tobacco use: Secondary | ICD-10-CM | POA: Diagnosis not present

## 2023-11-16 DIAGNOSIS — F17210 Nicotine dependence, cigarettes, uncomplicated: Secondary | ICD-10-CM | POA: Diagnosis not present

## 2023-11-16 DIAGNOSIS — I110 Hypertensive heart disease with heart failure: Secondary | ICD-10-CM | POA: Diagnosis not present

## 2023-11-16 DIAGNOSIS — Z79899 Other long term (current) drug therapy: Secondary | ICD-10-CM | POA: Diagnosis not present

## 2023-11-16 DIAGNOSIS — Z7901 Long term (current) use of anticoagulants: Secondary | ICD-10-CM | POA: Diagnosis not present

## 2023-11-16 DIAGNOSIS — Z951 Presence of aortocoronary bypass graft: Secondary | ICD-10-CM | POA: Diagnosis not present

## 2023-11-16 DIAGNOSIS — I4891 Unspecified atrial fibrillation: Secondary | ICD-10-CM | POA: Diagnosis not present

## 2023-11-16 DIAGNOSIS — R634 Abnormal weight loss: Secondary | ICD-10-CM | POA: Diagnosis not present

## 2023-11-16 DIAGNOSIS — I2510 Atherosclerotic heart disease of native coronary artery without angina pectoris: Secondary | ICD-10-CM | POA: Diagnosis not present

## 2023-11-21 DIAGNOSIS — I5042 Chronic combined systolic (congestive) and diastolic (congestive) heart failure: Secondary | ICD-10-CM | POA: Diagnosis not present

## 2023-11-21 DIAGNOSIS — Z4502 Encounter for adjustment and management of automatic implantable cardiac defibrillator: Secondary | ICD-10-CM | POA: Diagnosis not present

## 2023-11-22 DIAGNOSIS — G8929 Other chronic pain: Secondary | ICD-10-CM | POA: Diagnosis not present

## 2023-11-22 DIAGNOSIS — M47817 Spondylosis without myelopathy or radiculopathy, lumbosacral region: Secondary | ICD-10-CM | POA: Diagnosis not present

## 2023-11-22 DIAGNOSIS — M545 Low back pain, unspecified: Secondary | ICD-10-CM | POA: Diagnosis not present

## 2023-11-22 DIAGNOSIS — Z79891 Long term (current) use of opiate analgesic: Secondary | ICD-10-CM | POA: Diagnosis not present

## 2023-12-06 DIAGNOSIS — M47816 Spondylosis without myelopathy or radiculopathy, lumbar region: Secondary | ICD-10-CM | POA: Diagnosis not present

## 2023-12-15 ENCOUNTER — Other Ambulatory Visit

## 2023-12-15 ENCOUNTER — Other Ambulatory Visit: Payer: Self-pay | Admitting: Internal Medicine

## 2023-12-15 DIAGNOSIS — E559 Vitamin D deficiency, unspecified: Secondary | ICD-10-CM | POA: Diagnosis not present

## 2023-12-15 DIAGNOSIS — E782 Mixed hyperlipidemia: Secondary | ICD-10-CM | POA: Diagnosis not present

## 2023-12-16 ENCOUNTER — Other Ambulatory Visit: Payer: Self-pay | Admitting: Internal Medicine

## 2023-12-16 LAB — COMPREHENSIVE METABOLIC PANEL WITH GFR
ALT: 16 IU/L (ref 0–44)
AST: 14 IU/L (ref 0–40)
Albumin: 4 g/dL (ref 3.7–4.7)
Alkaline Phosphatase: 138 IU/L — ABNORMAL HIGH (ref 44–121)
BUN/Creatinine Ratio: 24 (ref 10–24)
BUN: 22 mg/dL (ref 8–27)
Bilirubin Total: 0.5 mg/dL (ref 0.0–1.2)
CO2: 19 mmol/L — ABNORMAL LOW (ref 20–29)
Calcium: 9 mg/dL (ref 8.6–10.2)
Chloride: 105 mmol/L (ref 96–106)
Creatinine, Ser: 0.93 mg/dL (ref 0.76–1.27)
Globulin, Total: 2.4 g/dL (ref 1.5–4.5)
Glucose: 116 mg/dL — ABNORMAL HIGH (ref 70–99)
Potassium: 3.9 mmol/L (ref 3.5–5.2)
Sodium: 139 mmol/L (ref 134–144)
Total Protein: 6.4 g/dL (ref 6.0–8.5)
eGFR: 81 mL/min/{1.73_m2} (ref >59)

## 2023-12-16 LAB — LIPID PANEL
Chol/HDL Ratio: 2.4 ratio (ref 0.0–5.0)
Cholesterol, Total: 84 mg/dL — ABNORMAL LOW (ref 100–199)
HDL: 35 mg/dL — ABNORMAL LOW (ref >39)
LDL Chol Calc (NIH): 34 mg/dL (ref 0–99)
Triglycerides: 66 mg/dL (ref 0–149)
VLDL Cholesterol Cal: 15 mg/dL (ref 5–40)

## 2023-12-16 LAB — VITAMIN D 25 HYDROXY (VIT D DEFICIENCY, FRACTURES): Vit D, 25-Hydroxy: 27.1 ng/mL — ABNORMAL LOW (ref 30.0–100.0)

## 2023-12-20 ENCOUNTER — Ambulatory Visit: Payer: Self-pay | Admitting: Internal Medicine

## 2023-12-20 ENCOUNTER — Ambulatory Visit (INDEPENDENT_AMBULATORY_CARE_PROVIDER_SITE_OTHER): Admitting: Internal Medicine

## 2023-12-20 ENCOUNTER — Encounter: Payer: Self-pay | Admitting: Internal Medicine

## 2023-12-20 VITALS — BP 115/60 | HR 40 | Temp 96.3°F | Ht 72.0 in | Wt 159.8 lb

## 2023-12-20 DIAGNOSIS — I10 Essential (primary) hypertension: Secondary | ICD-10-CM | POA: Diagnosis not present

## 2023-12-20 DIAGNOSIS — E559 Vitamin D deficiency, unspecified: Secondary | ICD-10-CM

## 2023-12-20 DIAGNOSIS — M1A0790 Idiopathic chronic gout, unspecified ankle and foot, without tophus (tophi): Secondary | ICD-10-CM

## 2023-12-20 DIAGNOSIS — R748 Abnormal levels of other serum enzymes: Secondary | ICD-10-CM | POA: Diagnosis not present

## 2023-12-20 DIAGNOSIS — E782 Mixed hyperlipidemia: Secondary | ICD-10-CM | POA: Diagnosis not present

## 2023-12-20 DIAGNOSIS — I5032 Chronic diastolic (congestive) heart failure: Secondary | ICD-10-CM

## 2023-12-20 MED ORDER — CHOLECALCIFEROL 1.25 MG (50000 UT) PO TABS: 1.0000 | ORAL_TABLET | ORAL | 0 refills | Status: AC

## 2023-12-20 MED ORDER — ATORVASTATIN CALCIUM 20 MG PO TABS: 20.0000 mg | ORAL_TABLET | Freq: Every day | ORAL | 0 refills | Status: DC

## 2023-12-20 MED ORDER — TRAZODONE HCL 50 MG PO TABS: 50.0000 mg | ORAL_TABLET | Freq: Every evening | ORAL | 2 refills | Status: DC | PRN

## 2023-12-20 MED ORDER — ALLOPURINOL 300 MG PO TABS: 450.0000 mg | ORAL_TABLET | Freq: Every day | ORAL | 1 refills | Status: DC

## 2023-12-20 MED ORDER — VITAMIN D (CHOLECALCIFEROL) 50 MCG (2000 UT) PO CAPS: 1.0000 | ORAL_CAPSULE | Freq: Every day | ORAL | 0 refills | Status: AC

## 2023-12-20 NOTE — Progress Notes (Signed)
 Established Patient Office Visit  Subjective:  Patient ID: Duane Owens, male    DOB: Dec 09, 1940  Age: 83 y.o. MRN: [id number]  Chief Complaint  Patient presents with   Follow-up    11 week lab results    No new complaints, here for lab review and medication refills. Labs reviewed and notable for well controlled lipids, low Vit D and elevated alk phos. Denies recent falls or trauma. Recently visited the ER for CHF exacerbation but dc'd home with prn lasix .    No other concerns at this time.   Past Medical History:  Diagnosis Date   Anemia    Arrhythmia    atrial fibrillation   Atherosclerosis    Cancer [facility])    prostate   CHF (congestive heart failure) (HCC)    Coronary artery disease    Hypercholesteremia    Hyperlipemia    Hypertension    Septicemia (HCC)    Spinal stenosis    Varicose vein    Venous insufficiency     Past Surgical History:  Procedure Laterality Date   BACK SURGERY     CYSTOSCOPY WITH DIRECT VISION INTERNAL URETHROTOMY N/A 02/02/2016   Procedure: CYSTOSCOPY WITH DIRECT VISION INTERNAL URETHROTOMY;  Surgeon: Rea Cambridge, MD;  Location: [facility] ORS;  Service: Urology;  Laterality: N/A;   HOLMIUM LASER APPLICATION N/A 02/02/2016   Procedure: HOLMIUM LASER APPLICATION;  Surgeon: Rea Cambridge, MD;  Location: [facility] ORS;  Service: Urology;  Laterality: N/A;   IR PERC TUN PERIT CATH WO PORT Davey Limas&I /IMAG  09/22/2021   IR REMOVAL TUN CV CATH W/O FL  10/15/2021   PERIPHERAL VASCULAR CATHETERIZATION N/A 08/11/2015   Procedure: Abdominal Aortogram w/Lower Extremity;  Surgeon: Jackquelyn Mass, MD;  Location: [facility] INVASIVE CV LAB;  Service: Cardiovascular;  Laterality: N/A;   PERIPHERAL VASCULAR CATHETERIZATION  08/11/2015   Procedure: Lower Extremity Intervention;  Surgeon: Jackquelyn Mass, MD;  Location: [facility] INVASIVE CV LAB;  Service: Cardiovascular;;   TEE WITHOUT CARDIOVERSION N/A 09/20/2021   Procedure: TRANSESOPHAGEAL ECHOCARDIOGRAM (TEE);  Surgeon:  Constancia Shahzaib, MD;  Location: [facility] ORS;  Service: Cardiovascular;  Laterality: N/A;   TONSILLECTOMY      Social History   Socioeconomic History   Marital status: Widowed    Spouse name: Not on file   Number of children: Not on file   Years of education: Not on file   Highest education level: Not on file  Occupational History   Not on file  Tobacco Use   Smoking status: Former    Current packs/day: 0.50    Average packs/day: 0.5 packs/day for 5.0 years (2.5 ttl pk-yrs)    Types: Cigarettes   Smokeless tobacco: Never   Tobacco comments:    Has called [facility] and ordered nicotene patches. Will Quit when patches start  Vaping Use   Vaping status: Never Used  Substance and Sexual Activity   Alcohol use: No   Drug use: No   Sexual activity: Not on file  Other Topics Concern   Not on file  Social History Narrative   Not on file   Social Drivers of Health   Financial Resource Strain: Not on file  Food Insecurity: No Food Insecurity (05/03/2022)   Hunger Vital Sign    Worried About Running Out of Food in the Last Year: Never true    Ran Out of Food in the Last Year: Never true  Transportation Needs: No Transportation Needs (05/03/2022)   PRAPARE - Transportation  Lack of Transportation (Medical): No    Lack of Transportation (Non-Medical): No  Physical Activity: Not on file  Stress: Not on file  Social Connections: Not on file  Intimate Partner Violence: Not on file    Family History  Problem Relation Age of Onset   Heart failure Mother    Diabetes Brother    COPD Brother     Allergies  Allergen Reactions   Amiodarone  Other (See Comments)    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  Would consider alternative agents if possible.    Outpatient Medications Prior to Visit  Medication Sig   apixaban  (ELIQUIS ) 5 MG TABS tablet Take 5 mg by mouth 2 (two) times daily.   furosemide  (LASIX ) 20 MG tablet Take 1 tablet (20  mg total) by mouth daily for 5 days.   HYDROcodone-acetaminophen  (NORCO) 7.5-325 MG tablet Take 1 tablet by mouth 3 (three) times daily as needed.   metoprolol  succinate (TOPROL -XL) 50 MG 24 hr tablet Take 50 mg by mouth daily.   mirtazapine  (REMERON ) 15 MG tablet TAKE 1 TABLET BY MOUTH AT BEDTIME   nitroGLYCERIN (NITROSTAT) 0.4 MG SL tablet Place under the tongue.   pantoprazole  (PROTONIX ) 40 MG tablet Take 1 tablet (40 mg total) by mouth daily.   spironolactone  (ALDACTONE ) 25 MG tablet Take 1 tablet (25 mg total) by mouth daily.   valsartan (DIOVAN) 40 MG tablet Take 0.5 tablets by mouth 2 (two) times daily.   [DISCONTINUED] allopurinol  (ZYLOPRIM ) 300 MG tablet Take 1.5 tablets (450 mg total) by mouth daily.   [DISCONTINUED] atorvastatin  (LIPITOR) 20 MG tablet Take 1 tablet (20 mg total) by mouth at bedtime.   [DISCONTINUED] traZODone  (DESYREL ) 50 MG tablet Take 1 tablet (50 mg total) by mouth at bedtime as needed for sleep.   No facility-administered medications prior to visit.    Review of Systems  Constitutional: Negative.   HENT: Negative.    Eyes: Negative.   Respiratory:  Positive for shortness of breath.   Cardiovascular: Negative.   Gastrointestinal: Negative.   Genitourinary: Negative.   Skin: Negative.   Neurological: Negative.   Endo/Heme/Allergies: Negative.        Objective:   BP 115/60   Pulse (!) 40   Temp (!) 96.3 F (35.7 C)   Ht 6' (1.829 m)   Wt 159 lb 12.8 oz (72.5 kg)   SpO2 98%   BMI 21.67 kg/m   Vitals:   12/20/23 0815  BP: 115/60  Pulse: (!) 40  Temp: (!) 96.3 F (35.7 C)  Height: 6' (1.829 m)  Weight: 159 lb 12.8 oz (72.5 kg)  SpO2: 98%  BMI (Calculated): 21.67    Physical Exam Vitals reviewed.  Constitutional:      Appearance: Normal appearance.  HENT:     Head: Normocephalic.     Left Ear: There is no impacted cerumen.     Nose: Nose normal.     Mouth/Throat:     Mouth: Mucous membranes are moist.     Pharynx: No posterior  oropharyngeal erythema.  Eyes:     Extraocular Movements: Extraocular movements intact.     Pupils: Pupils are equal, round, and reactive to light.  Cardiovascular:     Rate and Rhythm: Regular rhythm.     Chest Wall: PMI is not displaced.     Pulses: Normal pulses.     Heart sounds: Normal heart sounds. No murmur heard. Pulmonary:     Effort: Pulmonary effort is normal.  Breath sounds: Normal air entry. No rhonchi or rales.  Abdominal:     General: Abdomen is flat. Bowel sounds are normal. There is no distension.     Palpations: Abdomen is soft. There is no hepatomegaly, splenomegaly or mass.     Tenderness: There is no abdominal tenderness.  Musculoskeletal:        General: Normal range of motion.     Cervical back: Normal range of motion and neck supple.     Right lower leg: No edema.     Left lower leg: No edema.     Comments: Wearing lumbar brace  Skin:    General: Skin is warm and dry.  Neurological:     General: No focal deficit present.     Mental Status: He is alert and oriented to person, place, and time.     Cranial Nerves: No cranial nerve deficit.     Motor: No weakness.  Psychiatric:        Mood and Affect: Mood normal.        Behavior: Behavior normal.      No results found for any visits on 12/20/23.  Recent Results (from the past 2160 hours)  BMP8+Anion Gap     Status: None   Collection Time: 10/02/23  9:55 AM  Result Value Ref Range   Glucose 81 70 - 99 mg/dL   BUN 18 8 - 27 mg/dL   Creatinine, Ser 1.61 0.76 - 1.27 mg/dL   eGFR 62 >09 UE/AVW/0.98   BUN/Creatinine Ratio 16 10 - 24   Sodium 140 134 - 144 mmol/L   Potassium 4.5 3.5 - 5.2 mmol/L   Chloride 103 96 - 106 mmol/L   CO2 23 20 - 29 mmol/L   Anion Gap 14.0 10.0 - 18.0 mmol/L   Calcium  9.7 8.6 - 10.2 mg/dL  Basic metabolic panel     Status: Abnormal   Collection Time: 11/03/23  5:46 PM  Result Value Ref Range   Sodium 138 135 - 145 mmol/L   Potassium 3.8 3.5 - 5.1 mmol/L   Chloride  107 98 - 111 mmol/L   CO2 24 22 - 32 mmol/L   Glucose, Bld 104 (H) 70 - 99 mg/dL    Comment: Glucose reference range applies only to samples taken after fasting for at least 8 hours.   BUN 24 (H) 8 - 23 mg/dL   Creatinine, Ser 1.19 0.61 - 1.24 mg/dL   Calcium  8.5 (L) 8.9 - 10.3 mg/dL   GFR, Estimated >14 >78 mL/min    Comment: (NOTE) Calculated using the CKD-EPI Creatinine Equation (2021)    Anion gap 7 5 - 15    Comment: Performed at [facility], [address] [place], [place] [address]  Troponin I (High Sensitivity)     Status: None   Collection Time: 11/03/23  5:46 PM  Result Value Ref Range   Troponin I (High Sensitivity) 16 <18 ng/L    Comment: (NOTE) Elevated high sensitivity troponin I (hsTnI) values and significant  changes across serial measurements may suggest ACS but many other  chronic and acute conditions are known to elevate hsTnI results.  Refer to the "Links" section for chest pain algorithms and additional  guidance. Performed at [facility], [address] [place], [place] [address]   Brain natriuretic peptide     Status: Abnormal   Collection Time: 11/03/23  5:46 PM  Result Value Ref Range   B Natriuretic Peptide 1,322.8 (H) 0.0 - 100.0  pg/mL    Comment: Performed at [facility], [address] [place], [place] [address]  Comprehensive metabolic panel with GFR     Status: Abnormal   Collection Time: 12/15/23  9:38 AM  Result Value Ref Range   Glucose 116 (H) 70 - 99 mg/dL   BUN 22 8 - 27 mg/dL   Creatinine, Ser 6.04 0.76 - 1.27 mg/dL   eGFR 81 >54 UJ/WJX/9.14   BUN/Creatinine Ratio 24 10 - 24   Sodium 139 134 - 144 mmol/L   Potassium 3.9 3.5 - 5.2 mmol/L   Chloride 105 96 - 106 mmol/L   CO2 19 (L) 20 - 29 mmol/L   Calcium  9.0 8.6 - 10.2 mg/dL   Total Protein 6.4 6.0 - 8.5 g/dL   Albumin 4.0 3.7 - 4.7 g/dL   Globulin, Total 2.4 1.5 - 4.5 g/dL   Bilirubin Total 0.5 0.0 - 1.2 mg/dL   Alkaline Phosphatase 138 (H) 44 -  121 IU/L   AST 14 0 - 40 IU/L   ALT 16 0 - 44 IU/L  Lipid panel     Status: Abnormal   Collection Time: 12/15/23  9:38 AM  Result Value Ref Range   Cholesterol, Total 84 (L) 100 - 199 mg/dL   Triglycerides 66 0 - 149 mg/dL   HDL 35 (L) >78 mg/dL   VLDL Cholesterol Cal 15 5 - 40 mg/dL   LDL Chol Calc (NIH) 34 0 - 99 mg/dL   Chol/HDL Ratio 2.4 0.0 - 5.0 ratio    Comment:                                   T. Chol/HDL Ratio                                             Men  Women                               1/2 Avg.Risk  3.4    3.3                                   Avg.Risk  5.0    4.4                                2X Avg.Risk  9.6    7.1                                3X Avg.Risk 23.4   11.0   VITAMIN D  25 Hydroxy (Vit-D Deficiency, Fractures)     Status: Abnormal   Collection Time: 12/15/23  9:38 AM  Result Value Ref Range   Vit D, 25-Hydroxy 27.1 (L) 30.0 - 100.0 ng/mL    Comment: Vitamin D  deficiency has been defined by the Institute of Medicine and an Endocrine Society practice guideline as a level of serum 25-OH vitamin D  less than 20 ng/mL (1,2). The Endocrine Society went on to further define vitamin D  insufficiency as a level between 21 and 29 ng/mL (2). 1. IOM (  Institute of Medicine). 2010. Dietary reference    intakes for calcium  and D. [place]: The    [employer]. 2. Holick MF, Binkley Geraldine, Bischoff-Ferrari HA, et al.    Evaluation, treatment, and prevention of vitamin D     deficiency: an Endocrine Society clinical practice    guideline. JCEM. 2011 Jul; 96(7):1911-30.       Assessment & Plan:  As per problem list. CHF compensated on current dose of diuretics.   Problem List Items Addressed This Visit       Cardiovascular and Mediastinum   Essential hypertension, benign   Relevant Medications   atorvastatin  (LIPITOR) 20 MG tablet   Chronic heart failure with preserved ejection fraction (HCC) - Primary   Relevant Medications   atorvastatin   (LIPITOR) 20 MG tablet     Other   Gout (Chronic)   Relevant Medications   allopurinol  (ZYLOPRIM ) 300 MG tablet   Other Relevant Orders   CBC With Diff/Platelet   Uric acid   Mixed hyperlipidemia   Relevant Medications   atorvastatin  (LIPITOR) 20 MG tablet   Vitamin D  deficiency   Relevant Medications   Cholecalciferol  1.25 MG (50000 UT) TABS   Cholecalciferol  (VITAMIN D3) 50 MCG (2000 UT) CAPS   Other Relevant Orders   Vitamin D  (25 hydroxy)   Other Visit Diagnoses       Abnormal liver enzymes       Relevant Orders   Gamma GT, GGT (04540)       Return in about 3 months (around 03/21/2024) for awv with labs prior.   Total time spent: 20 minutes  Arzella Bitters, MD  12/20/2023   This document may have been prepared by [facility] Voice Recognition software and as such may include unintentional dictation errors.

## 2023-12-21 DIAGNOSIS — Z79891 Long term (current) use of opiate analgesic: Secondary | ICD-10-CM | POA: Diagnosis not present

## 2023-12-21 DIAGNOSIS — M47817 Spondylosis without myelopathy or radiculopathy, lumbosacral region: Secondary | ICD-10-CM | POA: Diagnosis not present

## 2023-12-21 DIAGNOSIS — M545 Low back pain, unspecified: Secondary | ICD-10-CM | POA: Diagnosis not present

## 2023-12-21 DIAGNOSIS — G8929 Other chronic pain: Secondary | ICD-10-CM | POA: Diagnosis not present

## 2023-12-22 DIAGNOSIS — Z4502 Encounter for adjustment and management of automatic implantable cardiac defibrillator: Secondary | ICD-10-CM | POA: Diagnosis not present

## 2023-12-22 DIAGNOSIS — I5042 Chronic combined systolic (congestive) and diastolic (congestive) heart failure: Secondary | ICD-10-CM | POA: Diagnosis not present

## 2023-12-28 DIAGNOSIS — H6123 Impacted cerumen, bilateral: Secondary | ICD-10-CM | POA: Diagnosis not present

## 2023-12-28 DIAGNOSIS — H903 Sensorineural hearing loss, bilateral: Secondary | ICD-10-CM | POA: Diagnosis not present

## 2024-01-18 DIAGNOSIS — Z79891 Long term (current) use of opiate analgesic: Secondary | ICD-10-CM | POA: Diagnosis not present

## 2024-01-18 DIAGNOSIS — M545 Low back pain, unspecified: Secondary | ICD-10-CM | POA: Diagnosis not present

## 2024-01-18 DIAGNOSIS — M47817 Spondylosis without myelopathy or radiculopathy, lumbosacral region: Secondary | ICD-10-CM | POA: Diagnosis not present

## 2024-01-18 DIAGNOSIS — G8929 Other chronic pain: Secondary | ICD-10-CM | POA: Diagnosis not present

## 2024-01-22 DIAGNOSIS — K000 Anodontia: Secondary | ICD-10-CM | POA: Diagnosis not present

## 2024-01-22 DIAGNOSIS — Z4509 Encounter for adjustment and management of other cardiac device: Secondary | ICD-10-CM | POA: Diagnosis not present

## 2024-01-22 DIAGNOSIS — I5042 Chronic combined systolic (congestive) and diastolic (congestive) heart failure: Secondary | ICD-10-CM | POA: Diagnosis not present

## 2024-01-23 DIAGNOSIS — Z95810 Presence of automatic (implantable) cardiac defibrillator: Secondary | ICD-10-CM | POA: Diagnosis not present

## 2024-01-23 DIAGNOSIS — I255 Ischemic cardiomyopathy: Secondary | ICD-10-CM | POA: Diagnosis not present

## 2024-01-23 DIAGNOSIS — Z4502 Encounter for adjustment and management of automatic implantable cardiac defibrillator: Secondary | ICD-10-CM | POA: Diagnosis not present

## 2024-01-24 DIAGNOSIS — K000 Anodontia: Secondary | ICD-10-CM | POA: Diagnosis not present

## 2024-02-15 DIAGNOSIS — M545 Low back pain, unspecified: Secondary | ICD-10-CM | POA: Diagnosis not present

## 2024-02-15 DIAGNOSIS — Z79891 Long term (current) use of opiate analgesic: Secondary | ICD-10-CM | POA: Diagnosis not present

## 2024-02-15 DIAGNOSIS — M47817 Spondylosis without myelopathy or radiculopathy, lumbosacral region: Secondary | ICD-10-CM | POA: Diagnosis not present

## 2024-02-15 DIAGNOSIS — G8929 Other chronic pain: Secondary | ICD-10-CM | POA: Diagnosis not present

## 2024-02-22 DIAGNOSIS — M47816 Spondylosis without myelopathy or radiculopathy, lumbar region: Secondary | ICD-10-CM | POA: Diagnosis not present

## 2024-02-22 DIAGNOSIS — I5042 Chronic combined systolic (congestive) and diastolic (congestive) heart failure: Secondary | ICD-10-CM | POA: Diagnosis not present

## 2024-02-22 DIAGNOSIS — Z4502 Encounter for adjustment and management of automatic implantable cardiac defibrillator: Secondary | ICD-10-CM | POA: Diagnosis not present

## 2024-03-06 DIAGNOSIS — H26493 Other secondary cataract, bilateral: Secondary | ICD-10-CM | POA: Diagnosis not present

## 2024-03-06 DIAGNOSIS — H35033 Hypertensive retinopathy, bilateral: Secondary | ICD-10-CM | POA: Diagnosis not present

## 2024-03-06 DIAGNOSIS — H40013 Open angle with borderline findings, low risk, bilateral: Secondary | ICD-10-CM | POA: Diagnosis not present

## 2024-03-06 DIAGNOSIS — H35363 Drusen (degenerative) of macula, bilateral: Secondary | ICD-10-CM | POA: Diagnosis not present

## 2024-03-19 DIAGNOSIS — M47817 Spondylosis without myelopathy or radiculopathy, lumbosacral region: Secondary | ICD-10-CM | POA: Diagnosis not present

## 2024-03-19 DIAGNOSIS — M545 Low back pain, unspecified: Secondary | ICD-10-CM | POA: Diagnosis not present

## 2024-03-19 DIAGNOSIS — Z79891 Long term (current) use of opiate analgesic: Secondary | ICD-10-CM | POA: Diagnosis not present

## 2024-03-19 DIAGNOSIS — G8929 Other chronic pain: Secondary | ICD-10-CM | POA: Diagnosis not present

## 2024-03-22 ENCOUNTER — Other Ambulatory Visit

## 2024-03-22 DIAGNOSIS — R748 Abnormal levels of other serum enzymes: Secondary | ICD-10-CM | POA: Diagnosis not present

## 2024-03-22 DIAGNOSIS — E559 Vitamin D deficiency, unspecified: Secondary | ICD-10-CM | POA: Diagnosis not present

## 2024-03-22 DIAGNOSIS — M1A0790 Idiopathic chronic gout, unspecified ankle and foot, without tophus (tophi): Secondary | ICD-10-CM | POA: Diagnosis not present

## 2024-03-27 ENCOUNTER — Ambulatory Visit: Payer: Self-pay | Admitting: Internal Medicine

## 2024-03-27 ENCOUNTER — Encounter: Payer: Self-pay | Admitting: Internal Medicine

## 2024-03-27 ENCOUNTER — Ambulatory Visit: Admitting: Internal Medicine

## 2024-03-27 ENCOUNTER — Other Ambulatory Visit: Payer: Self-pay | Admitting: Internal Medicine

## 2024-03-27 VITALS — BP 92/52 | HR 71 | Temp 97.3°F | Ht 72.0 in | Wt 154.2 lb

## 2024-03-27 DIAGNOSIS — E861 Hypovolemia: Secondary | ICD-10-CM | POA: Diagnosis not present

## 2024-03-27 DIAGNOSIS — Z0001 Encounter for general adult medical examination with abnormal findings: Secondary | ICD-10-CM | POA: Diagnosis not present

## 2024-03-27 DIAGNOSIS — E860 Dehydration: Secondary | ICD-10-CM | POA: Diagnosis not present

## 2024-03-27 DIAGNOSIS — K521 Toxic gastroenteritis and colitis: Secondary | ICD-10-CM

## 2024-03-27 DIAGNOSIS — Z1331 Encounter for screening for depression: Secondary | ICD-10-CM

## 2024-03-27 DIAGNOSIS — M1A0790 Idiopathic chronic gout, unspecified ankle and foot, without tophus (tophi): Secondary | ICD-10-CM | POA: Diagnosis not present

## 2024-03-27 DIAGNOSIS — I951 Orthostatic hypotension: Secondary | ICD-10-CM

## 2024-03-27 DIAGNOSIS — Z4502 Encounter for adjustment and management of automatic implantable cardiac defibrillator: Secondary | ICD-10-CM | POA: Diagnosis not present

## 2024-03-27 DIAGNOSIS — I5042 Chronic combined systolic (congestive) and diastolic (congestive) heart failure: Secondary | ICD-10-CM | POA: Diagnosis not present

## 2024-03-27 DIAGNOSIS — E782 Mixed hyperlipidemia: Secondary | ICD-10-CM | POA: Diagnosis not present

## 2024-03-27 LAB — COMPREHENSIVE METABOLIC PANEL WITH GFR
ALT: 6 IU/L (ref 0–44)
AST: 12 IU/L (ref 0–40)
Albumin: 3.7 g/dL (ref 3.7–4.7)
Alkaline Phosphatase: 140 IU/L — ABNORMAL HIGH (ref 44–121)
BUN/Creatinine Ratio: 12 (ref 10–24)
BUN: 12 mg/dL (ref 8–27)
Bilirubin Total: <0.2 mg/dL (ref 0.0–1.2)
CO2: 19 mmol/L — ABNORMAL LOW (ref 20–29)
Calcium: 8.7 mg/dL (ref 8.6–10.2)
Chloride: 106 mmol/L (ref 96–106)
Creatinine, Ser: 0.98 mg/dL (ref 0.76–1.27)
Globulin, Total: 2.5 g/dL (ref 1.5–4.5)
Glucose: 113 mg/dL — ABNORMAL HIGH (ref 70–99)
Potassium: 4.2 mmol/L (ref 3.5–5.2)
Sodium: 139 mmol/L (ref 134–144)
Total Protein: 6.2 g/dL (ref 6.0–8.5)
eGFR: 76 mL/min/1.73 (ref >59)

## 2024-03-27 LAB — CBC WITH DIFF/PLATELET

## 2024-03-27 LAB — VITAMIN D 25 HYDROXY (VIT D DEFICIENCY, FRACTURES): Vit D, 25-Hydroxy: 44.8 ng/mL (ref 30.0–100.0)

## 2024-03-27 LAB — LIPID PANEL
Chol/HDL Ratio: 3.6 ratio (ref 0.0–5.0)
Cholesterol, Total: 94 mg/dL — ABNORMAL LOW (ref 100–199)
HDL: 26 mg/dL — ABNORMAL LOW (ref >39)
LDL Chol Calc (NIH): 46 mg/dL (ref 0–99)
Triglycerides: 118 mg/dL (ref 0–149)
VLDL Cholesterol Cal: 22 mg/dL (ref 5–40)

## 2024-03-27 LAB — URIC ACID: Uric Acid: 3.8 mg/dL (ref 3.8–8.4)

## 2024-03-27 LAB — GAMMA GT: GGT: 25 IU/L (ref 0–65)

## 2024-03-27 NOTE — Progress Notes (Unsigned)
 Established Patient Office Visit  Subjective:  Patient ID: Duane Owens, male    DOB: 21-Nov-1940  Age: 83 y.o. MRN: [id number]  Chief Complaint  Patient presents with  . Annual Exam    AWV, discuss lab results    C/o gout flare, took colchicine  and now c/o fatigue and dizziness after diarrhea. Also here for AWV refer to quality metrics and scanned documents.  Labs reviewed and notable for well controlled lipids, cmp notable for elev alk phos but GGT is normal.     No other concerns at this time.   Past Medical History:  Diagnosis Date  . Anemia   . Arrhythmia    atrial fibrillation  . Atherosclerosis   . Cancer [facility])    prostate  . CHF (congestive heart failure) (HCC)   . Coronary artery disease   . Hypercholesteremia   . Hyperlipemia   . Hypertension   . Septicemia (HCC)   . Spinal stenosis   . Varicose vein   . Venous insufficiency     Past Surgical History:  Procedure Laterality Date  . BACK SURGERY    . CYSTOSCOPY WITH DIRECT VISION INTERNAL URETHROTOMY N/A 02/02/2016   Procedure: CYSTOSCOPY WITH DIRECT VISION INTERNAL URETHROTOMY;  Surgeon: Ozell JONELLE Burkes, MD;  Location: [facility] ORS;  Service: Urology;  Laterality: N/A;  . HOLMIUM LASER APPLICATION N/A 02/02/2016   Procedure: HOLMIUM LASER APPLICATION;  Surgeon: Ozell JONELLE Burkes, MD;  Location: [facility] ORS;  Service: Urology;  Laterality: N/A;  . IR PERC TUN PERIT CATH WO PORT Poonam Woehrle&I SHERRILL  09/22/2021  . IR REMOVAL TUN CV CATH W/O FL  10/15/2021  . PERIPHERAL VASCULAR CATHETERIZATION N/A 08/11/2015   Procedure: Abdominal Aortogram w/Lower Extremity;  Surgeon: Cordella KANDICE Shawl, MD;  Location: [facility] INVASIVE CV LAB;  Service: Cardiovascular;  Laterality: N/A;  . PERIPHERAL VASCULAR CATHETERIZATION  08/11/2015   Procedure: Lower Extremity Intervention;  Surgeon: Cordella KANDICE Shawl, MD;  Location: [facility] INVASIVE CV LAB;  Service: Cardiovascular;;  . TEE WITHOUT CARDIOVERSION N/A 09/20/2021   Procedure: TRANSESOPHAGEAL  ECHOCARDIOGRAM (TEE);  Surgeon: Darliss Rogue, MD;  Location: [facility] ORS;  Service: Cardiovascular;  Laterality: N/A;  . TONSILLECTOMY      Social History   Socioeconomic History  . Marital status: Widowed    Spouse name: Not on file  . Number of children: Not on file  . Years of education: Not on file  . Highest education level: Not on file  Occupational History  . Not on file  Tobacco Use  . Smoking status: Former    Current packs/day: 0.50    Average packs/day: 0.5 packs/day for 5.0 years (2.5 ttl pk-yrs)    Types: Cigarettes  . Smokeless tobacco: Never  . Tobacco comments:    Has called [place] [facility] and ordered nicotene patches. Will Quit when patches start  Vaping Use  . Vaping status: Never Used  Substance and Sexual Activity  . Alcohol use: No  . Drug use: No  . Sexual activity: Not on file  Other Topics Concern  . Not on file  Social History Narrative  . Not on file   Social Drivers of Health   Financial Resource Strain: Not on file  Food Insecurity: No Food Insecurity (05/03/2022)   Hunger Vital Sign   . Worried About Programme researcher, broadcasting/film/video in the Last Year: Never true   . Ran Out of Food in the Last Year: Never true  Transportation Needs: No Transportation Needs (05/03/2022)   PRAPARE -  Transportation   . Lack of Transportation (Medical): No   . Lack of Transportation (Non-Medical): No  Physical Activity: Not on file  Stress: Not on file  Social Connections: Not on file  Intimate Partner Violence: Not on file    Family History  Problem Relation Age of Onset  . Heart failure Mother   . Diabetes Brother   . COPD Brother     Allergies  Allergen Reactions  . 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  Would consider alternative agents if possible.    Outpatient Medications Prior to Visit  Medication Sig  . allopurinol  (ZYLOPRIM ) 300 MG tablet Take 1.5 tablets (450 mg total) by  mouth daily.  . apixaban  (ELIQUIS ) 5 MG TABS tablet Take 5 mg by mouth 2 (two) times daily.  . atorvastatin  (LIPITOR) 20 MG tablet Take 1 tablet (20 mg total) by mouth at bedtime.  . furosemide  (LASIX ) 20 MG tablet Take 1 tablet (20 mg total) by mouth daily for 5 days. (Patient taking differently: Take 20 mg by mouth as needed.)  . HYDROcodone-acetaminophen  (NORCO) 7.5-325 MG tablet Take 1 tablet by mouth 3 (three) times daily as needed.  . metoprolol  succinate (TOPROL -XL) 50 MG 24 hr tablet Take 50 mg by mouth daily.  . mirtazapine  (REMERON ) 15 MG tablet TAKE 1 TABLET BY MOUTH AT BEDTIME  . nitroGLYCERIN (NITROSTAT) 0.4 MG SL tablet Place under the tongue.  . pantoprazole  (PROTONIX ) 40 MG tablet Take 1 tablet (40 mg total) by mouth daily.  . traZODone  (DESYREL ) 50 MG tablet Take 1 tablet (50 mg total) by mouth at bedtime as needed for sleep.  . valsartan (DIOVAN) 40 MG tablet Take 0.5 tablets by mouth 2 (two) times daily.  . spironolactone  (ALDACTONE ) 25 MG tablet Take 1 tablet (25 mg total) by mouth daily. (Patient not taking: Reported on 03/27/2024)   No facility-administered medications prior to visit.    Review of Systems  Constitutional: Negative.   HENT: Negative.    Eyes: Negative.   Respiratory:  Positive for shortness of breath.   Cardiovascular: Negative.   Gastrointestinal: Negative.   Genitourinary: Negative.   Skin: Negative.   Neurological:  Positive for dizziness.  Endo/Heme/Allergies: Negative.        Objective:   BP (!) 92/52   Pulse 71   Temp (!) 97.3 F (36.3 C) (Tympanic)   Ht 6' (1.829 m)   Wt 154 lb 3.2 oz (69.9 kg)   SpO2 99%   BMI 20.91 kg/m   Vitals:   03/27/24 0828  BP: (!) 92/52  Pulse: 71  Temp: (!) 97.3 F (36.3 C)  Height: 6' (1.829 m)  Weight: 154 lb 3.2 oz (69.9 kg)  SpO2: 99%  TempSrc: Tympanic  BMI (Calculated): 20.91    Physical Exam Vitals reviewed.  Constitutional:      Appearance: Normal appearance. He is not  ill-appearing.  HENT:     Head: Normocephalic.     Left Ear: There is no impacted cerumen.     Nose: Nose normal.     Mouth/Throat:     Mouth: Mucous membranes are moist.     Pharynx: No posterior oropharyngeal erythema.  Eyes:     Extraocular Movements: Extraocular movements intact.     Pupils: Pupils are equal, round, and reactive to light.  Cardiovascular:     Rate and Rhythm: Regular rhythm.     Chest Wall: PMI is not displaced.  Pulses: Normal pulses.     Heart sounds: Normal heart sounds. No murmur heard. Pulmonary:     Effort: Pulmonary effort is normal.     Breath sounds: Normal air entry. Examination of the right-lower field reveals rales. Examination of the left-lower field reveals rales. Rales present. No rhonchi.  Abdominal:     General: Abdomen is flat. Bowel sounds are normal. There is no distension.     Palpations: Abdomen is soft. There is no hepatomegaly, splenomegaly or mass.     Tenderness: There is no abdominal tenderness.  Musculoskeletal:        General: Normal range of motion.     Cervical back: Normal range of motion and neck supple.     Right lower leg: No edema.     Left lower leg: No edema.     Comments: Wearing lumbar brace  Skin:    General: Skin is warm and dry.  Neurological:     General: No focal deficit present.     Mental Status: He is alert and oriented to person, place, and time.     Cranial Nerves: No cranial nerve deficit.     Motor: No weakness.  Psychiatric:        Mood and Affect: Mood normal.        Behavior: Behavior normal.      No results found for any visits on 03/27/24.  Recent Results (from the past 2160 hours)  CBC With Diff/Platelet     Status: None   Collection Time: 03/22/24  9:29 AM  Result Value Ref Range   WBC CANCELED x10E3/uL    Comment: Test not performed. Deterioration occurred during specimen handling. [facility] is providing the patient with re-collection instructions.  Result canceled by the  ancillary.    RBC CANCELED     Comment: Test not performed  Result canceled by the ancillary.    Hemoglobin CANCELED     Comment: Test not performed  Result canceled by the ancillary.    Hematocrit CANCELED     Comment: Test not performed  Result canceled by the ancillary.    Platelets CANCELED     Comment: Test not performed  Result canceled by the ancillary.    Neutrophils CANCELED     Comment: Test not performed  Result canceled by the ancillary.    Lymphs CANCELED     Comment: Test not performed  Result canceled by the ancillary.    Monocytes CANCELED     Comment: Test not performed  Result canceled by the ancillary.    Eos CANCELED     Comment: Test not performed  Result canceled by the ancillary.    Lymphocytes Absolute CANCELED     Comment: Test not performed  Result canceled by the ancillary.    EOS (ABSOLUTE) CANCELED     Comment: Test not performed  Result canceled by the ancillary.    Basophils Absolute CANCELED     Comment: Test not performed  Result canceled by the ancillary.   Vitamin D  (25 hydroxy)     Status: None   Collection Time: 03/22/24  9:29 AM  Result Value Ref Range   Vit D, 25-Hydroxy 44.8 30.0 - 100.0 ng/mL    Comment: Vitamin D  deficiency has been defined by the Institute of Medicine and an Endocrine Society practice guideline as a level of serum 25-OH vitamin D  less than 20 ng/mL (1,2). The Endocrine Society went on to further define vitamin D  insufficiency as a level between 21  and 29 ng/mL (2). 1. IOM (Institute of Medicine). 2010. Dietary reference    intakes for calcium  and D. [place]: The    [employer]. 2. Holick MF, Binkley [place], Bischoff-Ferrari HA, et al.    Evaluation, treatment, and prevention of vitamin D     deficiency: an Endocrine Society clinical practice    guideline. JCEM. 2011 Jul; 96(7):1911-30.   Gamma GT, GGT [phone])     Status: None   Collection Time: 03/22/24  9:29 AM   Result Value Ref Range   GGT 25 0 - 65 IU/L  Uric acid     Status: None   Collection Time: 03/22/24  9:29 AM  Result Value Ref Range   Uric Acid 3.8 3.8 - 8.4 mg/dL    Comment:            Therapeutic target for gout patients: <6.0  Comprehensive metabolic panel with GFR     Status: Abnormal   Collection Time: 03/22/24  9:29 AM  Result Value Ref Range   Glucose 113 (H) 70 - 99 mg/dL   BUN 12 8 - 27 mg/dL   Creatinine, Ser 9.01 0.76 - 1.27 mg/dL   eGFR 76 >40 fO/fpw/8.26   BUN/Creatinine Ratio 12 10 - 24   Sodium 139 134 - 144 mmol/L   Potassium 4.2 3.5 - 5.2 mmol/L   Chloride 106 96 - 106 mmol/L   CO2 19 (L) 20 - 29 mmol/L   Calcium  8.7 8.6 - 10.2 mg/dL   Total Protein 6.2 6.0 - 8.5 g/dL   Albumin 3.7 3.7 - 4.7 g/dL   Globulin, Total 2.5 1.5 - 4.5 g/dL   Bilirubin Total <9.7 0.0 - 1.2 mg/dL   Alkaline Phosphatase 140 (H) 44 - 121 IU/L    Comment: **Effective April 08, 2024 Alkaline Phosphatase**   reference interval will be changing to:              Age                Male          Male           0 -  5 days         47 - 127       47 - 127           6 - 10 days         29 - 242       29 - 242          11 - 20 days        109 - 357      109 - 357          21 - 30 days         94 - 494       94 - 494           1 -  2 months      149 - 539      149 - 539           3 -  6 months      131 - 452      131 - 452           7 - 11 months      117 - 401      117 - 401   12 months -  6 years       158 -  369      158 - 369           7 - 12 years       150 - 409      150 - 409               13 years       156 - 435       78 - 227               14 years       114 - 375       64 - 161               15 years        88 - 279       56 - 134               16 years        74 - 207       51 - 121               17 years        63 - 161       47 - 113          18 - 20 years        51 - 125       42 - 106          21 - 50 years         47 - 123       41 - 116          51 - 80 years         49 - 135       51 - 125              >80 years        48 - 129       48 - 129    AST 12 0 - 40 IU/L   ALT 6 0 - 44 IU/L  Lipid panel     Status: Abnormal   Collection Time: 03/22/24  9:29 AM  Result Value Ref Range   Cholesterol, Total 94 (L) 100 - 199 mg/dL   Triglycerides 881 0 - 149 mg/dL   HDL 26 (L) >60 mg/dL   VLDL Cholesterol Cal 22 5 - 40 mg/dL   LDL Chol Calc (NIH) 46 0 - 99 mg/dL   Chol/HDL Ratio 3.6 0.0 - 5.0 ratio    Comment:                                   T. Chol/HDL Ratio                                             Men  Women                               1/2 Avg.Risk  3.4    3.3                                   Avg.Risk  5.0    4.4                                2X Avg.Risk  9.6    7.1                                3X Avg.Risk 23.4   11.0       Assessment & Plan:  Jorden was seen today for annual exam.  Dehydration  Hypotension due to hypovolemia  Mixed hyperlipidemia -     Comprehensive metabolic panel with GFR -     Lipid panel  Chronic gout of foot, unspecified cause, unspecified laterality  Diarrhea due to drug    Problem List Items Addressed This Visit       Cardiovascular and Mediastinum   Hypotension due to hypovolemia     Digestive   Diarrhea due to drug     Other   Gout (Chronic)   Mixed hyperlipidemia   Other Visit Diagnoses       Dehydration    -  Primary       Return in about 2 weeks (around 04/10/2024) for BP followup.   Total time spent: 20 minutes  Sherrill Cinderella Perry, MD  03/27/2024   This document may have been prepared by [facility] Voice Recognition software and as such may include unintentional dictation errors.

## 2024-03-28 LAB — LIPID PANEL
Chol/HDL Ratio: 3.2 ratio (ref 0.0–5.0)
Cholesterol, Total: 86 mg/dL — ABNORMAL LOW (ref 100–199)
HDL: 27 mg/dL — ABNORMAL LOW (ref >39)
LDL Chol Calc (NIH): 38 mg/dL (ref 0–99)
Triglycerides: 116 mg/dL (ref 0–149)
VLDL Cholesterol Cal: 21 mg/dL (ref 5–40)

## 2024-03-28 LAB — COMPREHENSIVE METABOLIC PANEL WITH GFR
ALT: 10 IU/L (ref 0–44)
AST: 14 IU/L (ref 0–40)
Albumin: 4.2 g/dL (ref 3.7–4.7)
Alkaline Phosphatase: 157 IU/L — ABNORMAL HIGH (ref 44–121)
BUN/Creatinine Ratio: 19 (ref 10–24)
BUN: 31 mg/dL — ABNORMAL HIGH (ref 8–27)
Bilirubin Total: 0.4 mg/dL (ref 0.0–1.2)
CO2: 15 mmol/L — ABNORMAL LOW (ref 20–29)
Calcium: 9.1 mg/dL (ref 8.6–10.2)
Chloride: 105 mmol/L (ref 96–106)
Creatinine, Ser: 1.64 mg/dL — ABNORMAL HIGH (ref 0.76–1.27)
Globulin, Total: 2.7 g/dL (ref 1.5–4.5)
Glucose: 101 mg/dL — ABNORMAL HIGH (ref 70–99)
Potassium: 4 mmol/L (ref 3.5–5.2)
Sodium: 137 mmol/L (ref 134–144)
Total Protein: 6.9 g/dL (ref 6.0–8.5)
eGFR: 41 mL/min/1.73 — ABNORMAL LOW (ref >59)

## 2024-03-29 ENCOUNTER — Ambulatory Visit: Payer: Self-pay | Admitting: Internal Medicine

## 2024-04-10 ENCOUNTER — Other Ambulatory Visit: Payer: Self-pay | Admitting: Internal Medicine

## 2024-04-10 ENCOUNTER — Encounter: Payer: Self-pay | Admitting: Internal Medicine

## 2024-04-10 ENCOUNTER — Ambulatory Visit (INDEPENDENT_AMBULATORY_CARE_PROVIDER_SITE_OTHER): Admitting: Internal Medicine

## 2024-04-10 VITALS — BP 110/62 | HR 73 | Ht 72.0 in | Wt 158.6 lb

## 2024-04-10 DIAGNOSIS — Z0130 Encounter for examination of blood pressure without abnormal findings: Secondary | ICD-10-CM

## 2024-04-10 DIAGNOSIS — I5032 Chronic diastolic (congestive) heart failure: Secondary | ICD-10-CM | POA: Diagnosis not present

## 2024-04-10 DIAGNOSIS — M1A0790 Idiopathic chronic gout, unspecified ankle and foot, without tophus (tophi): Secondary | ICD-10-CM

## 2024-04-10 DIAGNOSIS — N179 Acute kidney failure, unspecified: Secondary | ICD-10-CM | POA: Diagnosis not present

## 2024-04-10 DIAGNOSIS — E559 Vitamin D deficiency, unspecified: Secondary | ICD-10-CM | POA: Diagnosis not present

## 2024-04-10 DIAGNOSIS — E782 Mixed hyperlipidemia: Secondary | ICD-10-CM

## 2024-04-10 DIAGNOSIS — E861 Hypovolemia: Secondary | ICD-10-CM | POA: Diagnosis not present

## 2024-04-10 DIAGNOSIS — E860 Dehydration: Secondary | ICD-10-CM

## 2024-04-10 NOTE — Progress Notes (Signed)
 Established Patient Office Visit  Subjective:  Patient ID: Duane Owens, male    DOB: July 29, 1940  Age: 83 y.o. MRN: [id number]  Chief Complaint  Patient presents with   Follow-up    2 week follow up    No further light headedness or dizziness. No further diarrhea and bp satisfactory today.    No other concerns at this time.   Past Medical History:  Diagnosis Date   Anemia    Arrhythmia    atrial fibrillation   Atherosclerosis    Cancer [facility])    prostate   CHF (congestive heart failure) (HCC)    Coronary artery disease    Hypercholesteremia    Hyperlipemia    Hypertension    Septicemia (HCC)    Spinal stenosis    Varicose vein    Venous insufficiency     Past Surgical History:  Procedure Laterality Date   BACK SURGERY     CYSTOSCOPY WITH DIRECT VISION INTERNAL URETHROTOMY N/A 02/02/2016   Procedure: CYSTOSCOPY WITH DIRECT VISION INTERNAL URETHROTOMY;  Surgeon: Ozell JONELLE Burkes, MD;  Location: [facility] ORS;  Service: Urology;  Laterality: N/A;   HOLMIUM LASER APPLICATION N/A 02/02/2016   Procedure: HOLMIUM LASER APPLICATION;  Surgeon: Ozell JONELLE Burkes, MD;  Location: [facility] ORS;  Service: Urology;  Laterality: N/A;   IR PERC TUN PERIT CATH WO PORT [facility]I /IMAG  09/22/2021   IR REMOVAL TUN CV CATH W/O FL  10/15/2021   PERIPHERAL VASCULAR CATHETERIZATION N/A 08/11/2015   Procedure: Abdominal Aortogram w/Lower Extremity;  Surgeon: Cordella KANDICE Shawl, MD;  Location: [facility] INVASIVE CV LAB;  Service: Cardiovascular;  Laterality: N/A;   PERIPHERAL VASCULAR CATHETERIZATION  08/11/2015   Procedure: Lower Extremity Intervention;  Surgeon: Cordella KANDICE Shawl, MD;  Location: [facility] INVASIVE CV LAB;  Service: Cardiovascular;;   TEE WITHOUT CARDIOVERSION N/A 09/20/2021   Procedure: TRANSESOPHAGEAL ECHOCARDIOGRAM (TEE);  Surgeon: Darliss Rogue, MD;  Location: [facility] ORS;  Service: Cardiovascular;  Laterality: N/A;   TONSILLECTOMY      Social History   Socioeconomic History   Marital status:  Widowed    Spouse name: Not on file   Number of children: Not on file   Years of education: Not on file   Highest education level: Not on file  Occupational History   Not on file  Tobacco Use   Smoking status: Former    Current packs/day: 0.50    Average packs/day: 0.5 packs/day for 5.0 years (2.5 ttl pk-yrs)    Types: Cigarettes   Smokeless tobacco: Never   Tobacco comments:    Has called [place] [facility] and ordered nicotene patches. Will Quit when patches start  Vaping Use   Vaping status: Never Used  Substance and Sexual Activity   Alcohol use: No   Drug use: No   Sexual activity: Not on file  Other Topics Concern   Not on file  Social History Narrative   Not on file   Social Drivers of Health   Financial Resource Strain: Not on file  Food Insecurity: No Food Insecurity (05/03/2022)   Hunger Vital Sign    Worried About Running Out of Food in the Last Year: Never true    Ran Out of Food in the Last Year: Never true  Transportation Needs: No Transportation Needs (05/03/2022)   PRAPARE - Administrator, Civil Service (Medical): No    Lack of Transportation (Non-Medical): No  Physical Activity: Not on file  Stress: Not on file  Social Connections:  Not on file  Intimate Partner Violence: Not on file    Family History  Problem Relation Age of Onset   Heart failure Mother    Diabetes Brother    COPD Brother     Allergies  Allergen Reactions   Amiodarone  Other (See Comments)    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  Would consider alternative agents if possible.    Outpatient Medications Prior to Visit  Medication Sig   allopurinol  (ZYLOPRIM ) 300 MG tablet Take 1.5 tablets (450 mg total) by mouth daily.   apixaban  (ELIQUIS ) 5 MG TABS tablet Take 5 mg by mouth 2 (two) times daily.   atorvastatin  (LIPITOR) 20 MG tablet Take 1 tablet (20 mg total) by mouth at bedtime.   furosemide  (LASIX ) 20 MG tablet Take 1 tablet (20  mg total) by mouth daily for 5 days. (Patient taking differently: Take 20 mg by mouth as needed.)   HYDROcodone-acetaminophen  (NORCO) 7.5-325 MG tablet Take 1 tablet by mouth 3 (three) times daily as needed.   metoprolol  succinate (TOPROL -XL) 50 MG 24 hr tablet Take 50 mg by mouth daily.   mirtazapine  (REMERON ) 15 MG tablet TAKE 1 TABLET BY MOUTH AT BEDTIME   nitroGLYCERIN (NITROSTAT) 0.4 MG SL tablet Place under the tongue.   pantoprazole  (PROTONIX ) 40 MG tablet Take 1 tablet (40 mg total) by mouth daily.   spironolactone  (ALDACTONE ) 25 MG tablet Take 1 tablet (25 mg total) by mouth daily. (Patient not taking: Reported on 03/27/2024)   traZODone  (DESYREL ) 50 MG tablet Take 1 tablet (50 mg total) by mouth at bedtime as needed for sleep.   valsartan (DIOVAN) 40 MG tablet Take 0.5 tablets by mouth 2 (two) times daily.   No facility-administered medications prior to visit.    Review of Systems  Constitutional: Negative.   HENT: Negative.    Eyes: Negative.   Respiratory:  Positive for shortness of breath.   Cardiovascular: Negative.   Gastrointestinal: Negative.   Genitourinary: Negative.   Skin: Negative.   Neurological:  Negative for dizziness.  Endo/Heme/Allergies: Negative.        Objective:   BP 110/62   Pulse 73   Ht 6' (1.829 m)   Wt 158 lb 9.6 oz (71.9 kg)   SpO2 98%   BMI 21.51 kg/m   Vitals:   04/10/24 1115  BP: 110/62  Pulse: 73  Height: 6' (1.829 m)  Weight: 158 lb 9.6 oz (71.9 kg)  SpO2: 98%  BMI (Calculated): 21.51    Physical Exam Vitals reviewed.  Constitutional:      Appearance: Normal appearance. He is not ill-appearing.  HENT:     Head: Normocephalic.     Left Ear: There is no impacted cerumen.     Nose: Nose normal.     Mouth/Throat:     Mouth: Mucous membranes are moist.     Pharynx: No posterior oropharyngeal erythema.  Eyes:     Extraocular Movements: Extraocular movements intact.     Pupils: Pupils are equal, round, and reactive to  light.  Cardiovascular:     Rate and Rhythm: Regular rhythm.     Chest Wall: PMI is not displaced.     Pulses: Normal pulses.     Heart sounds: Normal heart sounds. No murmur heard. Pulmonary:     Effort: Pulmonary effort is normal.     Breath sounds: Normal air entry. Examination of the right-lower field reveals rales. Examination of the left-lower field reveals rales. Rales present. No rhonchi.  Abdominal:     General: Abdomen is flat. Bowel sounds are normal. There is no distension.     Palpations: Abdomen is soft. There is no hepatomegaly, splenomegaly or mass.     Tenderness: There is no abdominal tenderness.  Musculoskeletal:        General: Normal range of motion.     Cervical back: Normal range of motion and neck supple.     Right lower leg: No edema.     Left lower leg: No edema.     Comments: Wearing lumbar brace  Skin:    General: Skin is warm and dry.  Neurological:     General: No focal deficit present.     Mental Status: He is alert and oriented to person, place, and time.     Cranial Nerves: No cranial nerve deficit.     Motor: No weakness.  Psychiatric:        Mood and Affect: Mood normal.        Behavior: Behavior normal.      No results found for any visits on 04/10/24.  Recent Results (from the past 2160 hours)  CBC With Diff/Platelet     Status: None   Collection Time: 03/22/24  9:29 AM  Result Value Ref Range   WBC CANCELED x10E3/uL    Comment: Test not performed. Deterioration occurred during specimen handling. [facility] is providing the patient with re-collection instructions.  Result canceled by the ancillary.    RBC CANCELED     Comment: Test not performed  Result canceled by the ancillary.    Hemoglobin CANCELED     Comment: Test not performed  Result canceled by the ancillary.    Hematocrit CANCELED     Comment: Test not performed  Result canceled by the ancillary.    Platelets CANCELED     Comment: Test not performed  Result  canceled by the ancillary.    Neutrophils CANCELED     Comment: Test not performed  Result canceled by the ancillary.    Lymphs CANCELED     Comment: Test not performed  Result canceled by the ancillary.    Monocytes CANCELED     Comment: Test not performed  Result canceled by the ancillary.    Eos CANCELED     Comment: Test not performed  Result canceled by the ancillary.    Lymphocytes Absolute CANCELED     Comment: Test not performed  Result canceled by the ancillary.    EOS (ABSOLUTE) CANCELED     Comment: Test not performed  Result canceled by the ancillary.    Basophils Absolute CANCELED     Comment: Test not performed  Result canceled by the ancillary.   Vitamin D  (25 hydroxy)     Status: None   Collection Time: 03/22/24  9:29 AM  Result Value Ref Range   Vit D, 25-Hydroxy 44.8 30.0 - 100.0 ng/mL    Comment: Vitamin D  deficiency has been defined by the Institute of Medicine and an Endocrine Society practice guideline as a level of serum 25-OH vitamin D  less than 20 ng/mL (1,2). The Endocrine Society went on to further define vitamin D  insufficiency as a level between 21 and 29 ng/mL (2). 1. IOM (Institute of Medicine). 2010. Dietary reference    intakes for calcium  and D. [place]: The    [employer]. 2. Holick MF, Binkley [place], Bischoff-Ferrari HA, et al.    Evaluation, treatment, and prevention of vitamin D     deficiency: an  Endocrine Society clinical practice    guideline. JCEM. 2011 Jul; 96(7):1911-30.   Gamma GT, GGT [phone])     Status: None   Collection Time: 03/22/24  9:29 AM  Result Value Ref Range   GGT 25 0 - 65 IU/L  Uric acid     Status: None   Collection Time: 03/22/24  9:29 AM  Result Value Ref Range   Uric Acid 3.8 3.8 - 8.4 mg/dL    Comment:            Therapeutic target for gout patients: <6.0  Comprehensive metabolic panel with GFR     Status: Abnormal   Collection Time: 03/22/24  9:29 AM  Result Value Ref Range    Glucose 113 (H) 70 - 99 mg/dL   BUN 12 8 - 27 mg/dL   Creatinine, Ser 9.01 0.76 - 1.27 mg/dL   eGFR 76 >40 fO/fpw/8.26   BUN/Creatinine Ratio 12 10 - 24   Sodium 139 134 - 144 mmol/L   Potassium 4.2 3.5 - 5.2 mmol/L   Chloride 106 96 - 106 mmol/L   CO2 19 (L) 20 - 29 mmol/L   Calcium  8.7 8.6 - 10.2 mg/dL   Total Protein 6.2 6.0 - 8.5 g/dL   Albumin 3.7 3.7 - 4.7 g/dL   Globulin, Total 2.5 1.5 - 4.5 g/dL   Bilirubin Total <9.7 0.0 - 1.2 mg/dL   Alkaline Phosphatase 140 (H) 44 - 121 IU/L    Comment: **Effective April 08, 2024 Alkaline Phosphatase**   reference interval will be changing to:              Age                Male          Male           0 -  5 days         47 - 127       47 - 127           6 - 10 days         29 - 242       29 - 242          11 - 20 days        109 - 357      109 - 357          21 - 30 days         94 - 494       94 - 494           1 -  2 months      149 - 539      149 - 539           3 -  6 months      131 - 452      131 - 452           7 - 11 months      117 - 401      117 - 401   12 months -  6 years       158 - 369      158 - 369           7 - 12 years       150 - 409      150 - 409  13 years       156 - 435       78 - 227               14 years       114 - 375       64 - 161               15 years        88 - 279       56 - 134               16 years        74 - 207       51 - 121               17 years        63 - 161       47 - 113          18 - 20 years        51 - 125       42 - 106          21 - 50 years         47 - 123       41 - 116          51 - 80 years        49 - 135       51 - 125              >80 years        48 - 129       48 - 129    AST 12 0 - 40 IU/L   ALT 6 0 - 44 IU/L  Lipid panel     Status: Abnormal   Collection Time: 03/22/24  9:29 AM  Result Value Ref Range   Cholesterol, Total 94 (L) 100 - 199 mg/dL   Triglycerides 881 0 - 149 mg/dL   HDL 26 (L) >60 mg/dL   VLDL Cholesterol Cal 22 5 - 40 mg/dL    LDL Chol Calc (NIH) 46 0 - 99 mg/dL   Chol/HDL Ratio 3.6 0.0 - 5.0 ratio    Comment:                                   T. Chol/HDL Ratio                                             Men  Women                               1/2 Avg.Risk  3.4    3.3                                   Avg.Risk  5.0    4.4                                2X Avg.Risk  9.6    7.1  3X Avg.Risk 23.4   11.0   Comprehensive metabolic panel with GFR     Status: Abnormal   Collection Time: 03/27/24  9:00 AM  Result Value Ref Range   Glucose 101 (H) 70 - 99 mg/dL   BUN 31 (H) 8 - 27 mg/dL   Creatinine, Ser 8.35 (H) 0.76 - 1.27 mg/dL   eGFR 41 (L) >40 fO/fpw/8.26   BUN/Creatinine Ratio 19 10 - 24   Sodium 137 134 - 144 mmol/L   Potassium 4.0 3.5 - 5.2 mmol/L   Chloride 105 96 - 106 mmol/L   CO2 15 (L) 20 - 29 mmol/L    Comment: **Verified by repeat analysis**   Calcium  9.1 8.6 - 10.2 mg/dL   Total Protein 6.9 6.0 - 8.5 g/dL   Albumin 4.2 3.7 - 4.7 g/dL   Globulin, Total 2.7 1.5 - 4.5 g/dL   Bilirubin Total 0.4 0.0 - 1.2 mg/dL   Alkaline Phosphatase 157 (H) 44 - 121 IU/L    Comment: **Effective April 08, 2024 Alkaline Phosphatase**   reference interval will be changing to:              Age                Male          Male           0 -  5 days         47 - 127       47 - 127           6 - 10 days         29 - 242       29 - 242          11 - 20 days        109 - 357      109 - 357          21 - 30 days         94 - 494       94 - 494           1 -  2 months      149 - 539      149 - 539           3 -  6 months      131 - 452      131 - 452           7 - 11 months      117 - 401      117 - 401   12 months -  6 years       158 - 369      158 - 369           7 - 12 years       150 - 409      150 - 409               13 years       156 - 435       78 - 227               14 years       114 - 375       64 - 161               15 years        24 -  279       56 - 134                16 years        74 - 207       51 - 121               17 years        63 - 161       47 - 113          18 - 20 years        51 - 125       42 - 106          21 - 50 years         47 - 123       41 - 116          51 - 80 years        49 - 135       51 - 125              >80 years        48 - 129       48 - 129    AST 14 0 - 40 IU/L   ALT 10 0 - 44 IU/L  Lipid panel     Status: Abnormal   Collection Time: 03/27/24  9:00 AM  Result Value Ref Range   Cholesterol, Total 86 (L) 100 - 199 mg/dL   Triglycerides 883 0 - 149 mg/dL   HDL 27 (L) >60 mg/dL   VLDL Cholesterol Cal 21 5 - 40 mg/dL   LDL Chol Calc (NIH) 38 0 - 99 mg/dL   Chol/HDL Ratio 3.2 0.0 - 5.0 ratio    Comment:                                   T. Chol/HDL Ratio                                             Men  Women                               1/2 Avg.Risk  3.4    3.3                                   Avg.Risk  5.0    4.4                                2X Avg.Risk  9.6    7.1                                3X Avg.Risk 23.4   11.0       Assessment & Plan:   Problem List Items Addressed This Visit   None   No follow-ups on file.   Total time spent: 20 minutes  Sherrill Cinderella Perry, MD  04/10/2024   This document may have been prepared by [employer] Voice  Recognition software and as such may include unintentional dictation errors.

## 2024-04-11 LAB — BMP8+ANION GAP
Anion Gap: 13 mmol/L (ref 10.0–18.0)
BUN/Creatinine Ratio: 14 (ref 10–24)
BUN: 14 mg/dL (ref 8–27)
CO2: 20 mmol/L (ref 20–29)
Calcium: 9 mg/dL (ref 8.6–10.2)
Chloride: 106 mmol/L (ref 96–106)
Creatinine, Ser: 0.98 mg/dL (ref 0.76–1.27)
Glucose: 128 mg/dL — ABNORMAL HIGH (ref 70–99)
Potassium: 3.7 mmol/L (ref 3.5–5.2)
Sodium: 139 mmol/L (ref 134–144)
eGFR: 76 mL/min/1.73 (ref >59)

## 2024-04-12 ENCOUNTER — Ambulatory Visit: Payer: Self-pay | Admitting: Internal Medicine

## 2024-04-16 DIAGNOSIS — M47817 Spondylosis without myelopathy or radiculopathy, lumbosacral region: Secondary | ICD-10-CM | POA: Diagnosis not present

## 2024-04-16 DIAGNOSIS — M545 Low back pain, unspecified: Secondary | ICD-10-CM | POA: Diagnosis not present

## 2024-04-16 DIAGNOSIS — Z79891 Long term (current) use of opiate analgesic: Secondary | ICD-10-CM | POA: Diagnosis not present

## 2024-04-16 DIAGNOSIS — G8929 Other chronic pain: Secondary | ICD-10-CM | POA: Diagnosis not present

## 2024-04-23 DIAGNOSIS — I255 Ischemic cardiomyopathy: Secondary | ICD-10-CM | POA: Diagnosis not present

## 2024-04-23 DIAGNOSIS — Z95810 Presence of automatic (implantable) cardiac defibrillator: Secondary | ICD-10-CM | POA: Diagnosis not present

## 2024-04-25 ENCOUNTER — Ambulatory Visit: Admitting: Internal Medicine

## 2024-04-25 DIAGNOSIS — Z23 Encounter for immunization: Secondary | ICD-10-CM | POA: Diagnosis not present

## 2024-04-26 ENCOUNTER — Other Ambulatory Visit: Payer: Self-pay | Admitting: Internal Medicine

## 2024-04-26 DIAGNOSIS — E782 Mixed hyperlipidemia: Secondary | ICD-10-CM

## 2024-04-29 DIAGNOSIS — I5042 Chronic combined systolic (congestive) and diastolic (congestive) heart failure: Secondary | ICD-10-CM | POA: Diagnosis not present

## 2024-04-29 DIAGNOSIS — Z4502 Encounter for adjustment and management of automatic implantable cardiac defibrillator: Secondary | ICD-10-CM | POA: Diagnosis not present

## 2024-05-06 NOTE — Progress Notes (Signed)
Flu shot  visit

## 2024-05-15 ENCOUNTER — Other Ambulatory Visit: Payer: Self-pay | Admitting: Internal Medicine

## 2024-05-16 DIAGNOSIS — R634 Abnormal weight loss: Secondary | ICD-10-CM | POA: Diagnosis not present

## 2024-05-16 DIAGNOSIS — I5022 Chronic systolic (congestive) heart failure: Secondary | ICD-10-CM | POA: Diagnosis not present

## 2024-05-21 DIAGNOSIS — M47817 Spondylosis without myelopathy or radiculopathy, lumbosacral region: Secondary | ICD-10-CM | POA: Diagnosis not present

## 2024-05-21 DIAGNOSIS — M545 Low back pain, unspecified: Secondary | ICD-10-CM | POA: Diagnosis not present

## 2024-05-21 DIAGNOSIS — G8929 Other chronic pain: Secondary | ICD-10-CM | POA: Diagnosis not present

## 2024-05-21 DIAGNOSIS — Z79891 Long term (current) use of opiate analgesic: Secondary | ICD-10-CM | POA: Diagnosis not present

## 2024-06-12 ENCOUNTER — Other Ambulatory Visit: Payer: Self-pay | Admitting: Internal Medicine

## 2024-06-14 ENCOUNTER — Other Ambulatory Visit

## 2024-06-15 LAB — COMPREHENSIVE METABOLIC PANEL WITH GFR
ALT: 5 IU/L (ref 0–44)
AST: 14 IU/L (ref 0–40)
Albumin: 3.7 g/dL (ref 3.7–4.7)
Alkaline Phosphatase: 132 IU/L — ABNORMAL HIGH (ref 48–129)
BUN/Creatinine Ratio: 15 (ref 10–24)
BUN: 14 mg/dL (ref 8–27)
Bilirubin Total: 0.6 mg/dL (ref 0.0–1.2)
CO2: 24 mmol/L (ref 20–29)
Calcium: 8.7 mg/dL (ref 8.6–10.2)
Chloride: 103 mmol/L (ref 96–106)
Creatinine, Ser: 0.91 mg/dL (ref 0.76–1.27)
Globulin, Total: 2.8 g/dL (ref 1.5–4.5)
Glucose: 121 mg/dL — ABNORMAL HIGH (ref 70–99)
Potassium: 3.7 mmol/L (ref 3.5–5.2)
Sodium: 141 mmol/L (ref 134–144)
Total Protein: 6.5 g/dL (ref 6.0–8.5)
eGFR: 83 mL/min/1.73 (ref >59)

## 2024-06-15 LAB — LIPID PANEL
Chol/HDL Ratio: 3.1 ratio (ref 0.0–5.0)
Cholesterol, Total: 93 mg/dL — ABNORMAL LOW (ref 100–199)
HDL: 30 mg/dL — ABNORMAL LOW (ref >39)
LDL Chol Calc (NIH): 46 mg/dL (ref 0–99)
Triglycerides: 81 mg/dL (ref 0–149)
VLDL Cholesterol Cal: 17 mg/dL (ref 5–40)

## 2024-06-18 DIAGNOSIS — M545 Low back pain, unspecified: Secondary | ICD-10-CM | POA: Diagnosis not present

## 2024-06-18 DIAGNOSIS — M47817 Spondylosis without myelopathy or radiculopathy, lumbosacral region: Secondary | ICD-10-CM | POA: Diagnosis not present

## 2024-06-18 DIAGNOSIS — G8929 Other chronic pain: Secondary | ICD-10-CM | POA: Diagnosis not present

## 2024-06-18 DIAGNOSIS — Z79891 Long term (current) use of opiate analgesic: Secondary | ICD-10-CM | POA: Diagnosis not present

## 2024-06-19 ENCOUNTER — Ambulatory Visit (INDEPENDENT_AMBULATORY_CARE_PROVIDER_SITE_OTHER): Admitting: Internal Medicine

## 2024-06-19 ENCOUNTER — Encounter: Payer: Self-pay | Admitting: Internal Medicine

## 2024-06-19 VITALS — BP 111/62 | HR 66 | Temp 98.1°F | Ht 72.0 in | Wt 155.2 lb

## 2024-06-19 DIAGNOSIS — R7303 Prediabetes: Secondary | ICD-10-CM | POA: Diagnosis not present

## 2024-06-19 DIAGNOSIS — F5101 Primary insomnia: Secondary | ICD-10-CM

## 2024-06-19 DIAGNOSIS — M1A0790 Idiopathic chronic gout, unspecified ankle and foot, without tophus (tophi): Secondary | ICD-10-CM | POA: Diagnosis not present

## 2024-06-19 DIAGNOSIS — Z0130 Encounter for examination of blood pressure without abnormal findings: Secondary | ICD-10-CM

## 2024-06-19 DIAGNOSIS — E782 Mixed hyperlipidemia: Secondary | ICD-10-CM

## 2024-06-19 MED ORDER — ALLOPURINOL 300 MG PO TABS: 450.0000 mg | ORAL_TABLET | Freq: Every day | ORAL | 1 refills | Status: AC

## 2024-06-19 MED ORDER — TRAZODONE HCL 50 MG PO TABS: 50.0000 mg | ORAL_TABLET | Freq: Every evening | ORAL | 2 refills | Status: AC | PRN

## 2024-06-19 NOTE — Progress Notes (Signed)
 Established Patient Office Visit  Subjective:  Patient ID: Duane Owens, male    DOB: 09-11-1940  Age: 83 y.o. MRN: [id number]  Chief Complaint  Patient presents with   Follow-up    10 week follow up    No new complaints, here for lab review and medication refills. LDL and TC well controlled on lab review. Triglycerides also satisfactory with cmp only notable for elevated fasting glucose in the prediabetic range.      No other concerns at this time.   Past Medical History:  Diagnosis Date   Anemia    Arrhythmia    atrial fibrillation   Atherosclerosis    Cancer [facility])    prostate   CHF (congestive heart failure) (HCC)    Coronary artery disease    Hypercholesteremia    Hyperlipemia    Hypertension    Septicemia (HCC)    Spinal stenosis    Varicose vein    Venous insufficiency     Past Surgical History:  Procedure Laterality Date   BACK SURGERY     CYSTOSCOPY WITH DIRECT VISION INTERNAL URETHROTOMY N/A 02/02/2016   Procedure: CYSTOSCOPY WITH DIRECT VISION INTERNAL URETHROTOMY;  Surgeon: Ozell JONELLE Burkes, MD;  Location: [facility] ORS;  Service: Urology;  Laterality: N/A;   HOLMIUM LASER APPLICATION N/A 02/02/2016   Procedure: HOLMIUM LASER APPLICATION;  Surgeon: Ozell JONELLE Burkes, MD;  Location: [facility] ORS;  Service: Urology;  Laterality: N/A;   IR PERC TUN PERIT CATH WO PORT [facility]I /IMAG  09/22/2021   IR REMOVAL TUN CV CATH W/O FL  10/15/2021   PERIPHERAL VASCULAR CATHETERIZATION N/A 08/11/2015   Procedure: Abdominal Aortogram w/Lower Extremity;  Surgeon: Cordella KANDICE Shawl, MD;  Location: [facility] INVASIVE CV LAB;  Service: Cardiovascular;  Laterality: N/A;   PERIPHERAL VASCULAR CATHETERIZATION  08/11/2015   Procedure: Lower Extremity Intervention;  Surgeon: Cordella KANDICE Shawl, MD;  Location: [facility] INVASIVE CV LAB;  Service: Cardiovascular;;   TEE WITHOUT CARDIOVERSION N/A 09/20/2021   Procedure: TRANSESOPHAGEAL ECHOCARDIOGRAM (TEE);  Surgeon: Darliss Rogue, MD;  Location: [facility]  ORS;  Service: Cardiovascular;  Laterality: N/A;   TONSILLECTOMY      Social History   Socioeconomic History   Marital status: Widowed    Spouse name: Not on file   Number of children: Not on file   Years of education: Not on file   Highest education level: Not on file  Occupational History   Not on file  Tobacco Use   Smoking status: Former    Current packs/day: 0.50    Average packs/day: 0.5 packs/day for 5.0 years (2.5 ttl pk-yrs)    Types: Cigarettes   Smokeless tobacco: Never   Tobacco comments:    Has called [place] [facility] and ordered nicotene patches. Will Quit when patches start  Vaping Use   Vaping status: Never Used  Substance and Sexual Activity   Alcohol use: No   Drug use: No   Sexual activity: Not on file  Other Topics Concern   Not on file  Social History Narrative   Not on file   Social Drivers of Health   Financial Resource Strain: Not on file  Food Insecurity: No Food Insecurity (05/16/2024)   [facility]   Hunger Vital Sign    Within the past 12 months, you worried that your food would run out before you got the money to buy more.: Never true    Within the past 12 months, the food you bought just didn't last  and you didn't have money to get more.: Never true  Transportation Needs: No Transportation Needs (05/16/2024)   Received from [facility] - Transportation    Lack of Transportation (Medical): No    Lack of Transportation (Non-Medical): No  Physical Activity: Not on file  Stress: Not on file  Social Connections: Not on file  Intimate Partner Violence: Not on file    Family History  Problem Relation Age of Onset   Heart failure Mother    Diabetes Brother    COPD Brother     Allergies  Allergen Reactions   Amiodarone  Other (See Comments)    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  Would consider alternative agents if possible.    Outpatient Medications Prior to Visit   Medication Sig   apixaban  (ELIQUIS ) 5 MG TABS tablet Take 5 mg by mouth 2 (two) times daily.   atorvastatin  (LIPITOR) 20 MG tablet TAKE 1 TABLET BY MOUTH AT BEDTIME   colchicine  0.6 MG tablet TAKE 1 TABLET BY MOUTH ONCE DAILY. UNTILPAIN FREE AS DIRECTED   furosemide  (LASIX ) 20 MG tablet Take 1 tablet (20 mg total) by mouth daily for 5 days. (Patient taking differently: Take 20 mg by mouth as needed.)   HYDROcodone-acetaminophen  (NORCO) 7.5-325 MG tablet Take 1 tablet by mouth 3 (three) times daily as needed.   metoprolol  succinate (TOPROL -XL) 50 MG 24 hr tablet Take 50 mg by mouth daily.   mirtazapine  (REMERON ) 15 MG tablet TAKE 1 TABLET BY MOUTH AT BEDTIME   nitroGLYCERIN (NITROSTAT) 0.4 MG SL tablet Place under the tongue.   pantoprazole  (PROTONIX ) 40 MG tablet TAKE 1 TABLET BY MOUTH ONCE DAILY   valsartan (DIOVAN) 40 MG tablet Take 0.5 tablets by mouth 2 (two) times daily.   [DISCONTINUED] allopurinol  (ZYLOPRIM ) 300 MG tablet Take 1.5 tablets (450 mg total) by mouth daily.   [DISCONTINUED] traZODone  (DESYREL ) 50 MG tablet TAKE 1 TABLET BY MOUTH AT BEDTIME AS NEEDED FOR SLEEP   spironolactone  (ALDACTONE ) 25 MG tablet Take 1 tablet (25 mg total) by mouth daily. (Patient not taking: Reported on 06/19/2024)   No facility-administered medications prior to visit.    Review of Systems  Constitutional:  Positive for weight loss (3 lbs).       Poor appetite due to recent dental extractions  HENT: Negative.    Eyes: Negative.   Respiratory:  Positive for shortness of breath.   Cardiovascular: Negative.   Gastrointestinal: Negative.   Genitourinary: Negative.   Skin: Negative.   Neurological:  Negative for dizziness.  Endo/Heme/Allergies: Negative.        Objective:   BP 111/62   Pulse 66   Temp 98.1 F (36.7 C)   Ht 6' (1.829 m)   Wt 155 lb 3.2 oz (70.4 kg)   SpO2 96%   BMI 21.05 kg/m   Vitals:   06/19/24 0943  BP: 111/62  Pulse: 66  Temp: 98.1 F (36.7 C)  Height: 6'  (1.829 m)  Weight: 155 lb 3.2 oz (70.4 kg)  SpO2: 96%  BMI (Calculated): 21.04    Physical Exam Vitals reviewed.  Constitutional:      Appearance: Normal appearance. He is not ill-appearing.  HENT:     Head: Normocephalic.     Left Ear: There is no impacted cerumen.     Nose: Nose normal.     Mouth/Throat:     Mouth: Mucous membranes are moist.     Pharynx: No posterior oropharyngeal erythema.  Eyes:     Extraocular Movements: Extraocular movements intact.     Pupils: Pupils are equal, round, and reactive to light.  Cardiovascular:     Rate and Rhythm: Regular rhythm.     Chest Wall: PMI is not displaced.     Pulses: Normal pulses.     Heart sounds: Normal heart sounds. No murmur heard. Pulmonary:     Effort: Pulmonary effort is normal.     Breath sounds: Normal air entry. Examination of the right-lower field reveals rales. Examination of the left-lower field reveals rales. Rales present. No rhonchi.  Abdominal:     General: Abdomen is flat. Bowel sounds are normal. There is no distension.     Palpations: Abdomen is soft. There is no hepatomegaly, splenomegaly or mass.     Tenderness: There is no abdominal tenderness.  Musculoskeletal:        General: Normal range of motion.     Cervical back: Normal range of motion and neck supple.     Right lower leg: No edema.     Left lower leg: No edema.     Comments: Wearing lumbar brace  Skin:    General: Skin is warm and dry.  Neurological:     General: No focal deficit present.     Mental Status: He is alert and oriented to person, place, and time.     Cranial Nerves: No cranial nerve deficit.     Motor: No weakness.  Psychiatric:        Mood and Affect: Mood normal.        Behavior: Behavior normal.      No results found for any visits on 06/19/24.      Assessment & Plan:  Treavon was seen today for follow-up.  Mixed hyperlipidemia -     Lipid panel  Chronic gout of foot, unspecified cause, unspecified  laterality -     Allopurinol ; Take 1.5 tablets (450 mg total) by mouth daily.  Dispense: 135 tablet; Refill: 1 -     Uric acid  Primary insomnia -     traZODone  HCl; Take 1 tablet (50 mg total) by mouth at bedtime as needed. for sleep  Dispense: 30 tablet; Refill: 2  Prediabetes -     Hemoglobin A1c    Problem List Items Addressed This Visit       Other   Gout (Chronic)   Relevant Medications   allopurinol  (ZYLOPRIM ) 300 MG tablet   Other Relevant Orders   Uric acid   Mixed hyperlipidemia - Primary   Relevant Orders   Lipid panel   Primary insomnia   Relevant Medications   traZODone  (DESYREL ) 50 MG tablet   Other Visit Diagnoses       Prediabetes       Relevant Orders   Hemoglobin A1c       Return in about 3 months (around 09/19/2024) for fu with labs prior.   Total time spent: 20 minutes. This time includes review of previous notes and results and patient face to face interaction during today'Jamien Casanova visit.    Sherrill Cinderella Perry, MD  06/19/2024   This document may have been prepared by [facility] Voice Recognition software and as such may include unintentional dictation errors.

## 2024-07-11 ENCOUNTER — Other Ambulatory Visit: Payer: Self-pay

## 2024-07-11 ENCOUNTER — Emergency Department

## 2024-07-11 ENCOUNTER — Observation Stay
Admission: EM | Admit: 2024-07-11 | Discharge: 2024-07-13 | Disposition: A | Discharge status: Home / Self Care | Attending: Hospitalist | Admitting: Hospitalist

## 2024-07-11 DIAGNOSIS — M109 Gout, unspecified: Secondary | ICD-10-CM | POA: Insufficient documentation

## 2024-07-11 DIAGNOSIS — N179 Acute kidney failure, unspecified: Secondary | ICD-10-CM | POA: Diagnosis not present

## 2024-07-11 DIAGNOSIS — D638 Anemia in other chronic diseases classified elsewhere: Secondary | ICD-10-CM | POA: Diagnosis not present

## 2024-07-11 DIAGNOSIS — J189 Pneumonia, unspecified organism: Principal | ICD-10-CM | POA: Insufficient documentation

## 2024-07-11 DIAGNOSIS — Z79899 Other long term (current) drug therapy: Secondary | ICD-10-CM | POA: Diagnosis not present

## 2024-07-11 DIAGNOSIS — E782 Mixed hyperlipidemia: Secondary | ICD-10-CM | POA: Insufficient documentation

## 2024-07-11 DIAGNOSIS — R918 Other nonspecific abnormal finding of lung field: Secondary | ICD-10-CM | POA: Diagnosis not present

## 2024-07-11 DIAGNOSIS — M6281 Muscle weakness (generalized): Secondary | ICD-10-CM | POA: Diagnosis not present

## 2024-07-11 DIAGNOSIS — G894 Chronic pain syndrome: Secondary | ICD-10-CM | POA: Diagnosis not present

## 2024-07-11 DIAGNOSIS — R9389 Abnormal findings on diagnostic imaging of other specified body structures: Secondary | ICD-10-CM | POA: Diagnosis not present

## 2024-07-11 DIAGNOSIS — Z8546 Personal history of malignant neoplasm of prostate: Secondary | ICD-10-CM | POA: Diagnosis not present

## 2024-07-11 DIAGNOSIS — Z87891 Personal history of nicotine dependence: Secondary | ICD-10-CM | POA: Diagnosis not present

## 2024-07-11 DIAGNOSIS — F5101 Primary insomnia: Secondary | ICD-10-CM | POA: Insufficient documentation

## 2024-07-11 DIAGNOSIS — E876 Hypokalemia: Secondary | ICD-10-CM | POA: Insufficient documentation

## 2024-07-11 DIAGNOSIS — M545 Low back pain, unspecified: Secondary | ICD-10-CM | POA: Insufficient documentation

## 2024-07-11 DIAGNOSIS — I509 Heart failure, unspecified: Secondary | ICD-10-CM | POA: Insufficient documentation

## 2024-07-11 DIAGNOSIS — I4891 Unspecified atrial fibrillation: Secondary | ICD-10-CM | POA: Insufficient documentation

## 2024-07-11 DIAGNOSIS — R7303 Prediabetes: Secondary | ICD-10-CM | POA: Insufficient documentation

## 2024-07-11 DIAGNOSIS — I10 Essential (primary) hypertension: Secondary | ICD-10-CM | POA: Diagnosis present

## 2024-07-11 DIAGNOSIS — R059 Cough, unspecified: Secondary | ICD-10-CM | POA: Diagnosis not present

## 2024-07-11 DIAGNOSIS — I110 Hypertensive heart disease with heart failure: Secondary | ICD-10-CM | POA: Diagnosis not present

## 2024-07-11 DIAGNOSIS — G8929 Other chronic pain: Secondary | ICD-10-CM | POA: Diagnosis present

## 2024-07-11 DIAGNOSIS — R0602 Shortness of breath: Secondary | ICD-10-CM | POA: Diagnosis not present

## 2024-07-11 LAB — LIPASE, BLOOD: Lipase: 39 U/L (ref 11–51)

## 2024-07-11 LAB — COMPREHENSIVE METABOLIC PANEL WITH GFR
ALT: 13 U/L (ref 0–44)
AST: 27 U/L (ref 15–41)
Albumin: 3.8 g/dL (ref 3.5–5.0)
Alkaline Phosphatase: 124 U/L (ref 38–126)
Anion gap: 13 (ref 5–15)
BUN: 24 mg/dL — ABNORMAL HIGH (ref 8–23)
CO2: 19 mmol/L — ABNORMAL LOW (ref 22–32)
Calcium: 8.8 mg/dL — ABNORMAL LOW (ref 8.9–10.3)
Chloride: 102 mmol/L (ref 98–111)
Creatinine, Ser: 1.39 mg/dL — ABNORMAL HIGH (ref 0.61–1.24)
GFR, Estimated: 50 mL/min — ABNORMAL LOW (ref >60)
Glucose, Bld: 116 mg/dL — ABNORMAL HIGH (ref 70–99)
Potassium: 3.4 mmol/L — ABNORMAL LOW (ref 3.5–5.1)
Sodium: 134 mmol/L — ABNORMAL LOW (ref 135–145)
Total Bilirubin: 0.6 mg/dL (ref 0.0–1.2)
Total Protein: 7.4 g/dL (ref 6.5–8.1)

## 2024-07-11 LAB — URINALYSIS, W/ REFLEX TO CULTURE (INFECTION SUSPECTED)
Bacteria, UA: NONE SEEN
Bilirubin Urine: NEGATIVE
Glucose, UA: NEGATIVE mg/dL
Ketones, ur: NEGATIVE mg/dL
Leukocytes,Ua: NEGATIVE
Nitrite: NEGATIVE
Protein, ur: 30 mg/dL — AB
Specific Gravity, Urine: 1.019 (ref 1.005–1.030)
Squamous Epithelial / HPF: 0 /HPF (ref 0–5)
pH: 5 (ref 5.0–8.0)

## 2024-07-11 LAB — PROTIME-INR
INR: 1.8 — ABNORMAL HIGH (ref 0.8–1.2)
Prothrombin Time: 21.6 s — ABNORMAL HIGH (ref 11.4–15.2)

## 2024-07-11 LAB — CBC
HCT: 33 % — ABNORMAL LOW (ref 39.0–52.0)
HCT: 35.2 % — ABNORMAL LOW (ref 39.0–52.0)
Hemoglobin: 10.9 g/dL — ABNORMAL LOW (ref 13.0–17.0)
Hemoglobin: 11.2 g/dL — ABNORMAL LOW (ref 13.0–17.0)
MCH: 30.4 pg (ref 26.0–34.0)
MCH: 30.1 pg (ref 26.0–34.0)
MCHC: 33 g/dL (ref 30.0–36.0)
MCHC: 31.8 g/dL (ref 30.0–36.0)
MCV: 91.9 fL (ref 80.0–100.0)
MCV: 94.6 fL (ref 80.0–100.0)
Platelets: 183 K/uL (ref 150–400)
Platelets: 165 K/uL (ref 150–400)
RBC: 3.59 MIL/uL — ABNORMAL LOW (ref 4.22–5.81)
RBC: 3.72 MIL/uL — ABNORMAL LOW (ref 4.22–5.81)
RDW: 14.8 % (ref 11.5–15.5)
RDW: 14.8 % (ref 11.5–15.5)
WBC: 16 K/uL — ABNORMAL HIGH (ref 4.0–10.5)
WBC: 15.1 K/uL — ABNORMAL HIGH (ref 4.0–10.5)
nRBC: 0 % (ref 0.0–0.2)
nRBC: 0 % (ref 0.0–0.2)

## 2024-07-11 LAB — LACTIC ACID, PLASMA
Lactic Acid, Venous: 1.9 mmol/L (ref 0.5–1.9)
Lactic Acid, Venous: 3.4 mmol/L (ref 0.5–1.9)

## 2024-07-11 LAB — RESP PANEL BY RT-PCR (RSV, FLU A&B, COVID)  RVPGX2
Influenza A by PCR: NEGATIVE
Influenza B by PCR: NEGATIVE
Resp Syncytial Virus by PCR: NEGATIVE
SARS Coronavirus 2 by RT PCR: NEGATIVE

## 2024-07-11 LAB — CREATININE, SERUM
Creatinine, Ser: 1.37 mg/dL — ABNORMAL HIGH (ref 0.61–1.24)
GFR, Estimated: 51 mL/min — ABNORMAL LOW (ref >60)

## 2024-07-11 LAB — TROPONIN T, HIGH SENSITIVITY: Troponin T High Sensitivity: 38 ng/L — ABNORMAL HIGH (ref 0–19)

## 2024-07-11 LAB — PROCALCITONIN: Procalcitonin: 0.17 ng/mL

## 2024-07-11 MED ORDER — ENOXAPARIN SODIUM 40 MG/0.4ML IJ SOSY: 40.0000 mg | PREFILLED_SYRINGE | INTRAMUSCULAR | Status: DC

## 2024-07-11 MED ORDER — ACETAMINOPHEN 650 MG RE SUPP: 650.0000 mg | Freq: Four times a day (QID) | RECTAL | Status: DC | PRN

## 2024-07-11 MED ORDER — METOPROLOL SUCCINATE ER 50 MG PO TB24
100.0000 mg | ORAL_TABLET | Freq: Two times a day (BID) | ORAL | Status: DC
  Administered 2024-07-11 – 2024-07-13 (×4): 100 mg via ORAL
  Filled 2024-07-11 (×4): qty 2

## 2024-07-11 MED ORDER — COLCHICINE 0.6 MG PO TABS
0.6000 mg | ORAL_TABLET | Freq: Two times a day (BID) | ORAL | Status: DC
  Administered 2024-07-11 – 2024-07-13 (×3): 0.6 mg via ORAL
  Filled 2024-07-11 (×4): qty 1

## 2024-07-11 MED ORDER — OXYCODONE HCL 5 MG PO TABS: 5.0000 mg | ORAL_TABLET | ORAL | Status: DC | PRN

## 2024-07-11 MED ORDER — POLYETHYLENE GLYCOL 3350 17 G PO PACK: 17.0000 g | PACK | Freq: Every day | ORAL | Status: DC | PRN

## 2024-07-11 MED ORDER — SODIUM CHLORIDE 0.9 % IV SOLN
500.0000 mg | INTRAVENOUS | Status: DC
  Administered 2024-07-11 – 2024-07-12 (×2): 500 mg via INTRAVENOUS
  Filled 2024-07-11 (×3): qty 5

## 2024-07-11 MED ORDER — AZITHROMYCIN 500 MG PO TABS
500.0000 mg | ORAL_TABLET | Freq: Once | ORAL | Status: DC
  Filled 2024-07-11: qty 1

## 2024-07-11 MED ORDER — POTASSIUM CHLORIDE 20 MEQ PO PACK
20.0000 meq | PACK | Freq: Once | ORAL | Status: AC
  Administered 2024-07-11: 18:00:00 20 meq via ORAL
  Filled 2024-07-11: qty 1

## 2024-07-11 MED ORDER — SODIUM CHLORIDE 0.9 % IV BOLUS
1000.0000 mL | Freq: Once | INTRAVENOUS | Status: AC
  Administered 2024-07-11: 16:00:00 1000 mL via INTRAVENOUS

## 2024-07-11 MED ORDER — SODIUM CHLORIDE 0.9 % IV SOLN
2.0000 g | Freq: Once | INTRAVENOUS | Status: DC
  Filled 2024-07-11: qty 20

## 2024-07-11 MED ORDER — SODIUM CHLORIDE 0.9 % IV SOLN: INTRAVENOUS | Status: DC

## 2024-07-11 MED ORDER — ONDANSETRON HCL 4 MG/2ML IJ SOLN
4.0000 mg | INTRAMUSCULAR | Status: AC
  Administered 2024-07-11: 16:00:00 4 mg via INTRAVENOUS
  Filled 2024-07-11: qty 2

## 2024-07-11 MED ORDER — ONDANSETRON HCL 4 MG/2ML IJ SOLN: 4.0000 mg | Freq: Four times a day (QID) | INTRAMUSCULAR | Status: DC | PRN

## 2024-07-11 MED ORDER — TRAZODONE HCL 50 MG PO TABS: 50.0000 mg | ORAL_TABLET | Freq: Every evening | ORAL | Status: DC | PRN

## 2024-07-11 MED ORDER — APIXABAN 5 MG PO TABS
5.0000 mg | ORAL_TABLET | Freq: Two times a day (BID) | ORAL | Status: DC
  Administered 2024-07-11 – 2024-07-13 (×4): 5 mg via ORAL
  Filled 2024-07-11 (×4): qty 1

## 2024-07-11 MED ORDER — MIRTAZAPINE 15 MG PO TABS
15.0000 mg | ORAL_TABLET | Freq: Every day | ORAL | Status: DC
  Administered 2024-07-11 – 2024-07-12 (×2): 15 mg via ORAL
  Filled 2024-07-11 (×2): qty 1

## 2024-07-11 MED ORDER — ONDANSETRON HCL 4 MG PO TABS: 4.0000 mg | ORAL_TABLET | Freq: Four times a day (QID) | ORAL | Status: DC | PRN

## 2024-07-11 MED ORDER — ATORVASTATIN CALCIUM 20 MG PO TABS
20.0000 mg | ORAL_TABLET | Freq: Every day | ORAL | Status: DC
  Administered 2024-07-11 – 2024-07-12 (×2): 20 mg via ORAL
  Filled 2024-07-11 (×2): qty 1

## 2024-07-11 MED ORDER — SODIUM CHLORIDE 0.9 % IV SOLN
2.0000 g | INTRAVENOUS | Status: DC
  Administered 2024-07-11 – 2024-07-12 (×2): 2 g via INTRAVENOUS
  Filled 2024-07-11 (×2): qty 20

## 2024-07-11 MED ORDER — ALLOPURINOL 300 MG PO TABS
450.0000 mg | ORAL_TABLET | Freq: Every day | ORAL | Status: DC
  Administered 2024-07-11 – 2024-07-13 (×3): 450 mg via ORAL
  Filled 2024-07-11: qty 1.5
  Filled 2024-07-11 (×2): qty 5
  Filled 2024-07-11: qty 1.5
  Filled 2024-07-11: qty 5
  Filled 2024-07-11: qty 1.5

## 2024-07-11 MED ORDER — PANTOPRAZOLE SODIUM 40 MG PO TBEC
40.0000 mg | DELAYED_RELEASE_TABLET | Freq: Every day | ORAL | Status: DC
  Administered 2024-07-11 – 2024-07-13 (×3): 40 mg via ORAL
  Filled 2024-07-11 (×3): qty 1

## 2024-07-11 MED ORDER — PREDNISONE 20 MG PO TABS
40.0000 mg | ORAL_TABLET | Freq: Once | ORAL | Status: AC
  Administered 2024-07-11: 16:00:00 40 mg via ORAL
  Filled 2024-07-11: qty 2

## 2024-07-11 MED ORDER — ACETAMINOPHEN 325 MG PO TABS: 650.0000 mg | ORAL_TABLET | Freq: Four times a day (QID) | ORAL | Status: DC | PRN

## 2024-07-11 NOTE — ED Triage Notes (Signed)
 Pt to ED for possible dehydration, states has not felt like eating or drinking and has had gout to feet

## 2024-07-11 NOTE — H&P (Signed)
 History and Physical    Duane Owens FMW:[id number] DOB: [birth year] DOA: 07/11/2024  DOS: the patient was seen and examined on 07/11/2024  PCP: Albina GORMAN Dine, MD   Patient coming from: Home  I have personally briefly reviewed patient's old medical records in [facility]  Chief Complaint: Cough and shortness of breath for a month  HPI: Duane Owens is a pleasant 83 y.o. 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  Patient stated that 1 month ago he had a gout and his primary care physician gave him some medications for gout.  At the same time patient stated that he was also complaining of some cough with productive sputum from the same duration.  Primary care provider did not give him any medications.  Patient stated that since then he started having progressive shortness of breath along with worsening cough, body aches and pain.  He stated that he feels like he is getting pneumonia.  He also complained of some diarrhea but has improved and gout is much better now.  Patient denies any fever, chills, leg swelling, chest pain, palpitations, hematemesis, dysuria.  ED Course: Upon arrival to the ED, patient is found to have leukocytosis at 16, creatinine of 1.39, troponin 38, EKG showed normal sinus rhythm at 65 bpm, x-ray of the chest showed bilateral lower lobe infiltrate.  Patient was given ceftriaxone  and azithromycin  for possible community-acquired pneumonia.  Hospitalist service was consulted for evaluation for admission.  Review of Systems:  ROS  All other systems negative except as noted in the HPI.  Past Medical History:  Diagnosis Date   Anemia    Arrhythmia    atrial fibrillation   Atherosclerosis    Cancer [facility])     prostate   CHF (congestive heart failure) (HCC)    Coronary artery disease    Hypercholesteremia    Hyperlipemia    Hypertension    Septicemia (HCC)    Spinal stenosis    Varicose vein    Venous insufficiency     Past Surgical History:  Procedure Laterality Date   BACK SURGERY     CYSTOSCOPY WITH DIRECT VISION INTERNAL URETHROTOMY N/A 02/02/2016   Procedure: CYSTOSCOPY WITH DIRECT VISION INTERNAL URETHROTOMY;  Surgeon: Ozell JONELLE Burkes, MD;  Location: [facility] ORS;  Service: Urology;  Laterality: N/A;   HOLMIUM LASER APPLICATION N/A 02/02/2016   Procedure: HOLMIUM LASER APPLICATION;  Surgeon: Ozell JONELLE Burkes, MD;  Location: [facility] ORS;  Service: Urology;  Laterality: N/A;   IR PERC TUN PERIT CATH WO PORT S&I /IMAG  09/22/2021   IR REMOVAL TUN CV CATH W/O FL  10/15/2021   PERIPHERAL VASCULAR CATHETERIZATION N/A 08/11/2015   Procedure: Abdominal Aortogram w/Lower Extremity;  Surgeon: Cordella KANDICE Shawl, MD;  Location: [facility] INVASIVE CV LAB;  Service: Cardiovascular;  Laterality: N/A;   PERIPHERAL VASCULAR CATHETERIZATION  08/11/2015   Procedure: Lower Extremity Intervention;  Surgeon: Cordella KANDICE Shawl, MD;  Location: [facility] INVASIVE CV LAB;  Service: Cardiovascular;;   TEE WITHOUT CARDIOVERSION N/A 09/20/2021   Procedure: TRANSESOPHAGEAL ECHOCARDIOGRAM (TEE);  Surgeon: Darliss Rogue, MD;  Location: [facility] ORS;  Service: Cardiovascular;  Laterality: N/A;   TONSILLECTOMY       reports that he has quit smoking. His smoking use included cigarettes. He has a 2.5 pack-year smoking  history. He has never used smokeless tobacco. He reports that he does not drink alcohol and does not use drugs.  Allergies[1]  Family History  Problem Relation Age of Onset   Heart failure Mother    Diabetes Brother    COPD Brother     Prior to Admission medications  Medication Sig Start Date End Date Taking? Authorizing Provider  allopurinol  (ZYLOPRIM ) 300 MG tablet Take 1.5 tablets (450 mg total) by mouth daily.  06/19/24   Albina GORMAN Dine, MD  apixaban  (ELIQUIS ) 5 MG TABS tablet Take 5 mg by mouth 2 (two) times daily.    [provider]  atorvastatin  (LIPITOR) 20 MG tablet TAKE 1 TABLET BY MOUTH AT BEDTIME 04/26/24   Tejan-Sie, GORMAN Dine, MD  colchicine  0.6 MG tablet TAKE 1 TABLET BY MOUTH ONCE DAILY. UNTILPAIN FREE AS DIRECTED 04/12/24   Albina GORMAN Dine, MD  furosemide  (LASIX ) 20 MG tablet Take 1 tablet (20 mg total) by mouth daily for 5 days. Patient taking differently: Take 20 mg by mouth as needed. 11/03/23 06/19/24  Malvina Alm DASEN, MD  HYDROcodone-acetaminophen  (NORCO) 7.5-325 MG tablet Take 1 tablet by mouth 3 (three) times daily as needed.    [provider]  metoprolol  succinate (TOPROL -XL) 50 MG 24 hr tablet Take 50 mg by mouth daily. 09/13/21   [provider]  mirtazapine  (REMERON ) 15 MG tablet TAKE 1 TABLET BY MOUTH AT BEDTIME 06/12/24   Albina GORMAN Dine, MD  nitroGLYCERIN (NITROSTAT) 0.4 MG SL tablet Place under the tongue. 02/22/19   [provider]  pantoprazole  (PROTONIX ) 40 MG tablet TAKE 1 TABLET BY MOUTH ONCE DAILY 05/15/24   Albina GORMAN Dine, MD  spironolactone  (ALDACTONE ) 25 MG tablet Take 1 tablet (25 mg total) by mouth daily. Patient not taking: Reported on 06/19/2024 11/03/23 11/02/24  Malvina Alm DASEN, MD  traZODone  (DESYREL ) 50 MG tablet Take 1 tablet (50 mg total) by mouth at bedtime as needed. for sleep 06/19/24   Tejan-Sie, S Ahmed, MD  valsartan (DIOVAN) 40 MG tablet Take 0.5 tablets by mouth 2 (two) times daily. 10/18/22   [provider]    Physical Exam: Vitals:   07/11/24 1138 07/11/24 1139 07/11/24 1145 07/11/24 1613  BP: (!) 125/57   (!) 135/53  Pulse: 85   75  Resp: 20   19  Temp:   97.6 F (36.4 C) 98.4 F (36.9 C)  TempSrc:   Oral Oral  SpO2: 94%   100%  Weight:  68 kg    Height:  6' (1.829 m)      Physical Exam   Constitutional: Alert, awake, calm, comfortable HEENT: Neck supple Respiratory: Bilateral  decreased air entry at the bases, no wheezing, no rales.  Cardiovascular: Regular rate and rhythm, no murmurs / rubs / gallops. No extremity edema. 2+ pedal pulses. No carotid bruits.  Abdomen: Soft, no tenderness, Bowel sounds positive.  Musculoskeletal: no clubbing / cyanosis. Good ROM, no contractures. Normal muscle tone.  Skin: no rashes, lesions, ulcers. Neurologic: CN 2-12 grossly intact. Sensation intact, No focal deficit identified Psychiatric: Alert and oriented x 3. Normal mood.    Labs on Admission: I have personally reviewed following labs and imaging studies  CBC: Recent Labs  Lab 07/11/24 1141 07/11/24 1547  WBC 16.0* 15.1*  HGB 10.9* 11.2*  HCT 33.0* 35.2*  MCV 91.9 94.6  PLT 183 165   Basic Metabolic Panel: Recent Labs  Lab 07/11/24 1141  NA 134*  K 3.4*  CL 102  CO2 19*  GLUCOSE 116*  BUN 24*  CREATININE 1.39*  CALCIUM  8.8*   GFR: Estimated Creatinine Clearance: 38 mL/min (A) (by C-G formula based on SCr of 1.39 mg/dL (H)). Liver Function Tests: Recent Labs  Lab 07/11/24 1141  AST 27  ALT 13  ALKPHOS 124  BILITOT 0.6  PROT 7.4  ALBUMIN 3.8   Recent Labs  Lab 07/11/24 1141  LIPASE 39   No results for input(s): AMMONIA in the last 168 hours. Coagulation Profile: No results for input(s): INR, PROTIME in the last 168 hours. Cardiac Enzymes: No results for input(s): CKTOTAL, CKMB, CKMBINDEX, TROPONINI, TROPONINIHS in the last 168 hours. BNP (last 3 results) Recent Labs    11/03/23 1746  BNP 1,322.8*   HbA1C: No results for input(s): HGBA1C in the last 72 hours. CBG: No results for input(s): GLUCAP in the last 168 hours. Lipid Profile: No results for input(s): CHOL, HDL, LDLCALC, TRIG, CHOLHDL, LDLDIRECT in the last 72 hours. Thyroid  Function Tests: No results for input(s): TSH, T4TOTAL, FREET4, T3FREE, THYROIDAB in the last 72 hours. Anemia Panel: No results for input(s): VITAMINB12,  FOLATE, FERRITIN, TIBC, IRON, RETICCTPCT in the last 72 hours. Urine analysis:    Component Value Date/Time   COLORURINE AMBER (A) 09/15/2021 1705   APPEARANCEUR Clear 06/28/2023 1423   LABSPEC 1.016 09/15/2021 1705   LABSPEC 1.020 05/31/2014 2217   PHURINE 5.0 09/15/2021 1705   GLUCOSEU Negative 06/28/2023 1423   GLUCOSEU see comment 05/31/2014 2217   HGBUR LARGE (A) 09/15/2021 1705   BILIRUBINUR Negative 06/28/2023 1423   BILIRUBINUR see comment 05/31/2014 2217   KETONESUR NEGATIVE 09/15/2021 1705   PROTEINUR 2+ (A) 06/28/2023 1423   PROTEINUR 100 (A) 09/15/2021 1705   NITRITE Negative 06/28/2023 1423   NITRITE NEGATIVE 09/15/2021 1705   LEUKOCYTESUR Negative 06/28/2023 1423   LEUKOCYTESUR NEGATIVE 09/15/2021 1705   LEUKOCYTESUR see comment 05/31/2014 2217    Radiological Exams on Admission: I have personally reviewed images DG Chest 2 View Result Date: 07/11/2024 EXAM: 2 VIEW(S) XRAY OF THE CHEST 07/11/2024 02:33:00 PM COMPARISON: 11/03/2023 CLINICAL HISTORY: cough 1 week FINDINGS: LINES, TUBES AND DEVICES: Stable left subclavian dual lead transvenous pacemaker. LUNGS AND PLEURA: New patchy opacity at the left lung base. Associated elevated left hemidiaphragm. Biapical pleural / pulmonary scarring. No pleural effusion. No pneumothorax. HEART AND MEDIASTINUM: Aortic atherosclerosis. CABG markers noted. BONES AND SOFT TISSUES: Sternotomy wires noted. Bilateral acromioclavicular DJD. IMPRESSION: 1. New patchy opacity at the left lung base with associated elevated left hemidiaphragm. The finding may represent atelectasis versus developing pneumonia. Follow-up PA and lateral chest X-ray is recommended in 3-4 weeks following therapy to ensure resolution. Electronically signed by: Morgane Naveau MD 07/11/2024 03:08 PM EST RP Workstation: [id number]    EKG: My personal interpretation of EKG shows: Sinus rhythm at 66 bpm, no ST elevation    Assessment/Plan Principal Problem:    CAP (community acquired pneumonia) Active Problems:   Chronic low back pain (Location of Primary Source of Pain) (Bilateral) (R>L)   Chronic pain syndrome   Gout   History of prostate cancer   Mixed hyperlipidemia   Essential hypertension, benign   Anemia of chronic disease    Assessment and Plan: 83 year old male with multiple medical problems including but not limited to chronic low back pain chronic pain syndrome, gouty arthritis, history of prostate cancer, HTN, HLD, chronic anemia who came into ED complaining of cough, shortness of breath for a month duration now he was found to have bilateral lower  lobe infiltrate.  1.  Community-acquired pneumonia without hypoxia - He will be placed in observation - He will be placed in pneumonia protocol with antibiotics ceftriaxone , azithromycin  - Will send procalcitonin, urine Legionella and Streptococcus antigen - Follow-up cultures  2.  History of gout - Will continue his allopurinol  from home dose - Will also continue his colchicine  - Continue to monitor his symptoms  3.  HTN/HLD - Stable - Resume home medications metoprolol  and statin - Will hold off Diovan due to concern for AKI  4.  Patient is taking Lasix  and spironolactone  - There is no diagnosis of congestive heart failure - Will hold off those medications but we can resume once patient kidney function improves  5.  Chronic pain syndrome - Patient usually takes Norco at home - He will be placed on Tylenol , oxycodone  and morphine .  6.  Atrial fibrillation with controlled response - Patient is already on metoprolol  and continue Eliquis  from home dose  7.  Hypokalemia - Will replace potassium and check potassium level in the morning     DVT prophylaxis: Lovenox  Code Status: Full Code Family Communication: None  Disposition Plan: Home  Consults called: None  Admission status: Observation, Telemetry bed   Nena Rebel, MD [facility] 07/11/2024, 4:31 PM         [1]  Allergies Allergen Reactions   Amiodarone  Other (See Comments)    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  Would consider alternative agents if possible.

## 2024-07-11 NOTE — ED Provider Notes (Signed)
 Spoke with Dr. Paudel, plan for admission to hospitalist service.   Fernand Rossie HERO, MD 07/11/24 [phone]

## 2024-07-11 NOTE — ED Provider Notes (Signed)
 [facility] Provider Note    Event Date/Time   First MD Initiated Contact with Patient 07/11/24 1314     (approximate)   History   Nausea   HPI  Duane Owens is a 83 y.o. male reports a history of gout prior cardiac disease  Patient reports he had gout about a week and a half ago.  He was given medicine he believes colchicine  and did not take any steroid.  He reports whenever he takes it it makes him dehydrated and gives him diarrhea.  But he also started about a week ago having a cough, some shortness of breath more so when he walks, and bodyaches and feels like he is getting pneumonia.  The cough has been productive, feels fatigued and has been a bit lightheaded.  Diarrhea has improved and is gout in his left ankle that is being treated for his gout and much better now.  He thinks he is dehydrated and possibly has pneumonia or something     Per PCP note active medical issues Mixed hyperlipidemia -     Lipid panel   Chronic gout of foot, unspecified cause, unspecified laterality -     Allopurinol ; Take 1.5 tablets (450 mg total) by mouth daily.  Dispense: 135 tablet; Refill: 1 -     Uric acid   Primary insomnia -     traZODone  HCl; Take 1 tablet (50 mg total) by mouth at bedtime as needed. for sleep  Dispense: 30 tablet; Refill: 2   Prediabetes -     Hemoglobin A1c  Physical Exam   Triage Vital Signs: ED Triage Vitals  Encounter Vitals Group     BP 07/11/24 1138 (!) 125/57     Girls Systolic BP Percentile --      Girls Diastolic BP Percentile --      Boys Systolic BP Percentile --      Boys Diastolic BP Percentile --      Pulse Rate 07/11/24 1138 85     Resp 07/11/24 1138 20     Temp 07/11/24 1145 97.6 F (36.4 C)     Temp Source 07/11/24 1145 Oral     SpO2 07/11/24 1138 94 %     Weight 07/11/24 1139 150 lb (68 kg)     Height 07/11/24 1139 6' (1.829 m)     Head Circumference --      Peak Flow --      Pain Score 07/11/24 1139 5      Pain Loc --      Pain Education --      Exclude from Growth Chart --     Most recent vital signs: Vitals:   07/11/24 1138 07/11/24 1145  BP: (!) 125/57   Pulse: 85   Resp: 20   Temp:  97.6 F (36.4 C)  SpO2: 94%      General: Awake, no distress he does have a frequent cough.  No distress no wheeze very pleasant mucous membranes are dry CV:  Good peripheral perfusion.  Normal tones Resp:  Normal effort except for very mild accessory muscle use.  He has rales primarily in the right lower lung field.  There is no wheezing. Abd:  No distention.  Soft nontender nondistended denies pain Other:  Both feet examined appear well, both ankle joints appear normal.  Patient reports his left ankle was painful and swollen but has gone back to normal now   ED Results / Procedures / Treatments  Labs (all labs ordered are listed, but only abnormal results are displayed) Labs Reviewed  COMPREHENSIVE METABOLIC PANEL WITH GFR - Abnormal; Notable for the following components:      Result Value   Sodium 134 (*)    Potassium 3.4 (*)    CO2 19 (*)    Glucose, Bld 116 (*)    BUN 24 (*)    Creatinine, Ser 1.39 (*)    Calcium  8.8 (*)    GFR, Estimated 50 (*)    All other components within normal limits  CBC - Abnormal; Notable for the following components:   WBC 16.0 (*)    RBC 3.59 (*)    Hemoglobin 10.9 (*)    HCT 33.0 (*)    All other components within normal limits  TROPONIN T, HIGH SENSITIVITY - Abnormal; Notable for the following components:   Troponin T High Sensitivity 38 (*)    All other components within normal limits  RESP PANEL BY RT-PCR (RSV, FLU A&B, COVID)  RVPGX2  CULTURE, BLOOD (ROUTINE X 2)  CULTURE, BLOOD (ROUTINE X 2)  LIPASE, BLOOD  URINALYSIS, ROUTINE W REFLEX MICROSCOPIC  LACTIC ACID, PLASMA  LACTIC ACID, PLASMA  PROTIME-INR  URINALYSIS, W/ REFLEX TO CULTURE (INFECTION SUSPECTED)  PROCALCITONIN  STREP PNEUMONIAE URINARY ANTIGEN  LEGIONELLA PNEUMOPHILA  SEROGP 1 UR AG  CBC  CREATININE, SERUM   Labs notable for leukocytosis, mild AKI.  Very mild hyponatremia and hypokalemia as well as slightly reduced CO2  EKG  And inter by me at 1400 heart rate 65 QRS 140, QTc somewhat hard to ascertain but appears to be normal in the setting of right bundle branch block.  Right bundle branch block   RADIOLOGY  Chest x-ray inter by me as bilateral lower lobe infiltrates  DG Chest 2 View Result Date: 07/11/2024 EXAM: 2 VIEW(S) XRAY OF THE CHEST 07/11/2024 02:33:00 PM COMPARISON: 11/03/2023 CLINICAL HISTORY: cough 1 week FINDINGS: LINES, TUBES AND DEVICES: Stable left subclavian dual lead transvenous pacemaker. LUNGS AND PLEURA: New patchy opacity at the left lung base. Associated elevated left hemidiaphragm. Biapical pleural / pulmonary scarring. No pleural effusion. No pneumothorax. HEART AND MEDIASTINUM: Aortic atherosclerosis. CABG markers noted. BONES AND SOFT TISSUES: Sternotomy wires noted. Bilateral acromioclavicular DJD. IMPRESSION: 1. New patchy opacity at the left lung base with associated elevated left hemidiaphragm. The finding may represent atelectasis versus developing pneumonia. Follow-up PA and lateral chest X-ray is recommended in 3-4 weeks following therapy to ensure resolution. Electronically signed by: Morgane Naveau MD 07/11/2024 03:08 PM EST RP Workstation: [id number]      PROCEDURES:  Critical Care performed: No  Procedures   MEDICATIONS ORDERED IN ED: Medications  sodium chloride  0.9 % bolus 1,000 mL (has no administration in time range)  cefTRIAXone  (ROCEPHIN ) 2 g in sodium chloride  0.9 % 100 mL IVPB (has no administration in time range)  azithromycin  (ZITHROMAX ) 500 mg in sodium chloride  0.9 % 250 mL IVPB (has no administration in time range)  enoxaparin  (LOVENOX ) injection 40 mg (has no administration in time range)  acetaminophen  (TYLENOL ) tablet 650 mg (has no administration in time range)    Or  acetaminophen   (TYLENOL ) suppository 650 mg (has no administration in time range)  oxyCODONE  (Oxy IR/ROXICODONE ) immediate release tablet 5 mg (has no administration in time range)  polyethylene glycol (MIRALAX  / GLYCOLAX ) packet 17 g (has no administration in time range)  ondansetron  (ZOFRAN ) tablet 4 mg (has no administration in time range)    Or  ondansetron  (ZOFRAN ) injection 4  mg (has no administration in time range)  ondansetron  (ZOFRAN ) injection 4 mg (4 mg Intravenous Given 07/11/24 1553)  predniSONE  (DELTASONE ) tablet 40 mg (40 mg Oral Given 07/11/24 1552)     IMPRESSION / MDM / ASSESSMENT AND PLAN / ED COURSE  I reviewed the triage vital signs and the nursing notes.                              Differential diagnosis includes, but is not limited to, possible development of respiratory infection, bronchitis, influenza, pneumonia etc.  My clinical assessment is description of symptoms and is Rales and productive cough his findings suggest community-acquired pneumonia.  He does not have any recent inpatient admissions or obvious risk factors for hospital-acquired.  He reports his diarrhea has improved but he feels dehydrated fatigued weak.  Gout appears to have improved.  He was not on a steroid his white count was quite elevated.  Will start azithromycin  and prednisone  as he has evidence of clinical examination of pneumonia, will also obtain chest x-ray and hydrate  Patient's presentation is most consistent with acute presentation with potential threat to life or bodily function.   The patient is on the cardiac monitor to evaluate for evidence of arrhythmia and/or significant heart rate changes.  Discussed with patient understanding agree with plan for admission.  Ongoing care signed to Dr. Maree, EDP, who will provide futher care and consult with hospitalist for admission request.      FINAL CLINICAL IMPRESSION(S) / ED DIAGNOSES   Final diagnoses:  Community acquired pneumonia of right lower  lobe of lung     Rx / DC Orders   ED Discharge Orders     None        Note:  This document was prepared using Dragon voice recognition software and may include unintentional dictation errors.   Dicky Anes, MD 07/11/24 [phone]

## 2024-07-12 DIAGNOSIS — J189 Pneumonia, unspecified organism: Secondary | ICD-10-CM | POA: Diagnosis not present

## 2024-07-12 LAB — COMPREHENSIVE METABOLIC PANEL WITH GFR
ALT: 9 U/L (ref 0–44)
AST: 25 U/L (ref 15–41)
Albumin: 3.1 g/dL — ABNORMAL LOW (ref 3.5–5.0)
Alkaline Phosphatase: 119 U/L (ref 38–126)
Anion gap: 10 (ref 5–15)
BUN: 20 mg/dL (ref 8–23)
CO2: 19 mmol/L — ABNORMAL LOW (ref 22–32)
Calcium: 8.2 mg/dL — ABNORMAL LOW (ref 8.9–10.3)
Chloride: 107 mmol/L (ref 98–111)
Creatinine, Ser: 0.97 mg/dL (ref 0.61–1.24)
GFR, Estimated: >60 mL/min
Glucose, Bld: 86 mg/dL (ref 70–99)
Potassium: 3.5 mmol/L (ref 3.5–5.1)
Sodium: 136 mmol/L (ref 135–145)
Total Bilirubin: 0.4 mg/dL (ref 0.0–1.2)
Total Protein: 6.2 g/dL — ABNORMAL LOW (ref 6.5–8.1)

## 2024-07-12 LAB — CBC
HCT: 28 % — ABNORMAL LOW (ref 39.0–52.0)
Hemoglobin: 9 g/dL — ABNORMAL LOW (ref 13.0–17.0)
MCH: 30.4 pg (ref 26.0–34.0)
MCHC: 32.1 g/dL (ref 30.0–36.0)
MCV: 94.6 fL (ref 80.0–100.0)
Platelets: 133 K/uL — ABNORMAL LOW (ref 150–400)
RBC: 2.96 MIL/uL — ABNORMAL LOW (ref 4.22–5.81)
RDW: 14.9 % (ref 11.5–15.5)
WBC: 13.8 K/uL — ABNORMAL HIGH (ref 4.0–10.5)
nRBC: 0 % (ref 0.0–0.2)

## 2024-07-12 LAB — PROTIME-INR
INR: 1.8 — ABNORMAL HIGH (ref 0.8–1.2)
Prothrombin Time: 21.9 s — ABNORMAL HIGH (ref 11.4–15.2)

## 2024-07-12 MED ORDER — SODIUM CHLORIDE 0.9 % IV SOLN: INTRAVENOUS | Status: AC

## 2024-07-12 MED ORDER — HYDROCODONE-ACETAMINOPHEN 10-325 MG PO TABS: 1.0000 | ORAL_TABLET | Freq: Three times a day (TID) | ORAL | Status: DC | PRN

## 2024-07-12 NOTE — Progress Notes (Signed)
" °  PROGRESS NOTE    CJ EDGELL  FMW:[id number] DOB: 06-Feb-1941 DOA: 07/11/2024 PCP: Albina GORMAN Dine, MD  125A/125A-AA  LOS: 0 days   Brief hospital course:   Assessment & Plan: Duane Owens is a pleasant 83 y.o. 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  Patient stated that 1 month ago he had a gout and his primary care physician gave him some medications for gout.  At the same time patient stated that he was also complaining of some cough with productive sputum from the same duration.  Primary care provider did not give him any medications.  Patient stated that since then he started having progressive shortness of breath along with worsening cough, body aches and pain.    1.  Possible Community-acquired pneumonia  --started on ceftriaxone , azithromycin  on admission. --WBC 16, but procal only 0.17 --cont ceftriaxone  and azithro for now --RVP  2.  recent gout flare --cont allopurinol  and colchicine    3.  HTN --cont Toprol   HLD --cont statin   5.  Chronic pain syndrome on chronic opioids --cont home Norco PRN  6.  Atrial fibrillation with controlled response --cont Toprol  and Eliquis    7.  Hypokalemia --monitor and supplement PRN  AKI --cont MIVF   DVT prophylaxis: On:Eliquis  Code Status: Full code  Family Communication:  Level of care: Telemetry Dispo:   The patient is from: home Anticipated d/c is to: home Anticipated d/c date is: 1-2 days   Subjective and Interval History:  Pt reported feeling better, cough improved.   Objective: Vitals:   07/12/24 0918 07/12/24 1220 07/12/24 1627 07/12/24 2045  BP: (!) 140/53 (!) 131/45 (!) 143/53 (!) 134/52  Pulse: 61 62 62 68  Resp: 16 18 17 18   Temp: 97.6 F  (36.4 C) 98.4 F (36.9 C) 99.1 F (37.3 C) 98.7 F (37.1 C)  TempSrc:  Oral Oral Oral  SpO2: 96% 97% 98% 97%  Weight:      Height:        Intake/Output Summary (Last 24 hours) at 07/12/2024 2112 Last data filed at 07/12/2024 1856 Gross per 24 hour  Intake 2123.53 ml  Output 450 ml  Net 1673.53 ml   Filed Weights   07/11/24 1139  Weight: 68 kg    Examination:   Constitutional: NAD, AAOx3 HEENT: conjunctivae and lids normal, EOMI CV: No cyanosis.   RESP: normal respiratory effort, on RA Neuro: II - XII grossly intact.   Psych: flat mood and affect.     Data Reviewed: I have personally reviewed labs and imaging studies  Time spent: 50 minutes  Ellouise Haber, MD [facility] If 7PM-7AM, please contact night-coverage 07/12/2024, 9:12 PM   "

## 2024-07-12 NOTE — Plan of Care (Signed)
  Problem: Education: Goal: Knowledge of General Education information will improve Description: Including pain rating scale, medication(s)/side effects and non-pharmacologic comfort measures Outcome: Progressing   Problem: Health Behavior/Discharge Planning: Goal: Ability to manage health-related needs will improve Outcome: Progressing   Problem: Clinical Measurements: Goal: Ability to maintain clinical measurements within normal limits will improve Outcome: Progressing Goal: Will remain free from infection Outcome: Progressing Goal: Diagnostic test results will improve Outcome: Progressing Goal: Respiratory complications will improve Outcome: Progressing Goal: Cardiovascular complication will be avoided Outcome: Progressing   Problem: Activity: Goal: Risk for activity intolerance will decrease Outcome: Progressing   Problem: Nutrition: Goal: Adequate nutrition will be maintained Outcome: Progressing   Problem: Coping: Goal: Level of anxiety will decrease Outcome: Progressing   Problem: Elimination: Goal: Will not experience complications related to bowel motility Outcome: Progressing Goal: Will not experience complications related to urinary retention Outcome: Progressing   Problem: Pain Managment: Goal: General experience of comfort will improve and/or be controlled Outcome: Progressing   Problem: Safety: Goal: Ability to remain free from injury will improve Outcome: Progressing   Problem: Skin Integrity: Goal: Risk for impaired skin integrity will decrease Outcome: Progressing   Problem: Activity: Goal: Ability to tolerate increased activity will improve Outcome: Progressing   Problem: Clinical Measurements: Goal: Ability to maintain a body temperature in the normal range will improve Outcome: Progressing   Problem: Respiratory: Goal: Ability to maintain adequate ventilation will improve Outcome: Progressing

## 2024-07-12 NOTE — TOC CM/SW Note (Signed)
 Transition of Care [facility]) - Inpatient Brief Assessment   Patient Details  Name: Duane Owens MRN: [id number] Date of Birth: [birth year]  Transition of Care [facility]) CM/SW Contact:    Nathanael CHRISTELLA Ring, RN Phone Number: 07/12/2024, 1:14 PM   Clinical Narrative: CM met with patient at the bedside, he declined to talk, he is not feeling well and does not want to answer any questions at this time.    Transition of Care Asessment: Insurance and Status: Insurance coverage has been reviewed Patient has primary care physician: Yes Home environment has been reviewed: From home   Prior/Current Home Services: No current home services Social Drivers of Health Review: SDOH reviewed no interventions necessary Readmission risk has been reviewed: No (Under obs no score generated) Transition of care needs: no transition of care needs at this time

## 2024-07-12 NOTE — Care Management Obs Status (Signed)
 MEDICARE OBSERVATION STATUS NOTIFICATION   Patient Details  Name: Duane Owens MRN: [id number] Date of Birth: 1941-01-08   Medicare Observation Status Notification Given:  Yes    Leota Maka W, CMA 07/12/2024, 2:51 PM

## 2024-07-13 ENCOUNTER — Other Ambulatory Visit: Payer: Self-pay

## 2024-07-13 DIAGNOSIS — J189 Pneumonia, unspecified organism: Secondary | ICD-10-CM | POA: Diagnosis not present

## 2024-07-13 LAB — BASIC METABOLIC PANEL WITH GFR
Anion gap: 11 (ref 5–15)
BUN: 16 mg/dL (ref 8–23)
CO2: 17 mmol/L — ABNORMAL LOW (ref 22–32)
Calcium: 8.2 mg/dL — ABNORMAL LOW (ref 8.9–10.3)
Chloride: 109 mmol/L (ref 98–111)
Creatinine, Ser: 0.74 mg/dL (ref 0.61–1.24)
GFR, Estimated: >60 mL/min
Glucose, Bld: 79 mg/dL (ref 70–99)
Potassium: 3.5 mmol/L (ref 3.5–5.1)
Sodium: 137 mmol/L (ref 135–145)

## 2024-07-13 LAB — RESPIRATORY PANEL BY PCR

## 2024-07-13 LAB — MAGNESIUM: Magnesium: 1.7 mg/dL (ref 1.7–2.4)

## 2024-07-13 LAB — LACTIC ACID, PLASMA: Lactic Acid, Venous: 0.8 mmol/L (ref 0.5–1.9)

## 2024-07-13 MED ORDER — PREDNISONE 20 MG PO TABS
40.0000 mg | ORAL_TABLET | Freq: Every day | ORAL | 0 refills | Status: AC
  Filled 2024-07-13: qty 10, 5d supply, fill #0

## 2024-07-13 MED ORDER — FUROSEMIDE 20 MG PO TABS: 20.0000 mg | ORAL_TABLET | ORAL | Status: AC | PRN

## 2024-07-13 MED ORDER — SODIUM CHLORIDE 0.9 % IV SOLN
2.0000 g | Freq: Once | INTRAVENOUS | Status: AC
  Administered 2024-07-13: 2 g via INTRAVENOUS
  Filled 2024-07-13: qty 20

## 2024-07-13 MED ORDER — IRBESARTAN 75 MG PO TABS
37.5000 mg | ORAL_TABLET | Freq: Every day | ORAL | Status: DC
  Administered 2024-07-13: 37.5 mg via ORAL
  Filled 2024-07-13: qty 0.5

## 2024-07-13 MED ORDER — AZITHROMYCIN 500 MG PO TABS
500.0000 mg | ORAL_TABLET | Freq: Once | ORAL | Status: AC
  Administered 2024-07-13: 500 mg via ORAL
  Filled 2024-07-13: qty 1

## 2024-07-13 MED ORDER — MAGNESIUM SULFATE IN D5W 1-5 GM/100ML-% IV SOLN
1.0000 g | Freq: Once | INTRAVENOUS | Status: AC
  Administered 2024-07-13: 1 g via INTRAVENOUS
  Filled 2024-07-13: qty 100

## 2024-07-13 MED ORDER — POTASSIUM CHLORIDE CRYS ER 20 MEQ PO TBCR
40.0000 meq | EXTENDED_RELEASE_TABLET | Freq: Once | ORAL | Status: AC
  Administered 2024-07-13: 40 meq via ORAL
  Filled 2024-07-13: qty 2

## 2024-07-13 MED ORDER — AMOXICILLIN-POT CLAVULANATE 875-125 MG PO TABS
1.0000 | ORAL_TABLET | Freq: Two times a day (BID) | ORAL | 0 refills | Status: AC
  Filled 2024-07-13: qty 4, 2d supply, fill #0

## 2024-07-13 NOTE — Plan of Care (Signed)

## 2024-07-13 NOTE — Evaluation (Signed)
 Physical Therapy Evaluation Patient Details Name: Duane Owens MRN: [id number] DOB: 1941-01-29 Today's Date: 07/13/2024  History of Present Illness  Jerran R Requena is a pleasant 83 y.o. 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 He was admitted with possible community-aquired pneumonia and recent gout flair.   Clinical Impression  Patient received resting in bed and agreeable to PT evaluation. He lives with his daughter and grandchildren where he will have 24/7 assistance available. He lives in a single story home with 3 steps to enter and L sided handrail. Prior to hospitalization he ambulated independently and was independent with ADLs. He denies falls in the last 6 months and states he does not push himself too hard, stays in the house most of the time, and is having some trouble with walking to the mailbox and back. Upon PT evaluation, patient was I with bed mobility, CGA with transfers and needed CGA - min A while ambulation 200 feet without an AD due to occasional LOB and staggering. Pt educated to use RW when walking outside at home. Patient appears to have a decline in functional independence and strength. Patient would benefit from skilled physical therapy to address impairments and functional limitations (see PT Problem List below) to work towards stated goals and return to PLOF or maximal functional independence.        If plan is discharge home, recommend the following: A little help with walking and/or transfers;A little help with bathing/dressing/bathroom;Help with stairs or ramp for entrance;Assist for transportation;Assistance with cooking/housework   Can travel by private vehicle    yes    Equipment Recommendations None  recommended by PT  Recommendations for Other Services       Functional Status Assessment Patient has had a recent decline in their functional status and demonstrates the ability to make significant improvements in function in a reasonable and predictable amount of time.     Precautions / Restrictions Precautions Precautions: Fall Restrictions Weight Bearing Restrictions Per Provider Order: No      Mobility  Bed Mobility Overal bed mobility: Independent                  Transfers Overall transfer level: Needs assistance Equipment used: None Transfers: Sit to/from Stand Sit to Stand: Contact guard assist           General transfer comment: sit <> stand bed to chair    Ambulation/Gait Ambulation/Gait assistance: Min assist, Contact guard assist Gait Distance (Feet): 200 Feet Assistive device: None Gait Pattern/deviations: Step-through pattern, Staggering right, Staggering left, Wide base of support Gait velocity: slow but steady     General Gait Details: patient ambulated around nursing station with wide stance. reaches for IV pole and staggers a bit at first with no AD (corrected by min A from PT)  Stairs            Wheelchair Mobility     Tilt Bed    Modified Rankin (Stroke Patients Only)       Balance Overall balance assessment: Needs assistance Sitting-balance support: Feet supported, No upper extremity supported Sitting balance-Leahy Scale: Good Sitting balance - Comments: able to don jacket while sitting at edge of bed   Standing balance support: No upper extremity supported, During functional activity Standing balance-Leahy Scale: Fair Standing balance  comment: reaches for objects to steady himself and staggers a bit at first when ambulating without AD                             Pertinent Vitals/Pain Pain Assessment Pain Assessment: No/denies pain    Home Living Family/patient expects to be discharged to:: Private  residence Living Arrangements: Children (daughter and grandchildren) Available Help at Discharge: Family;Available 24 hours/day Type of Home: House Home Access: Stairs to enter Entrance Stairs-Rails: Left Entrance Stairs-Number of Steps: 3   Home Layout: One level Home Equipment: [employer] (2 wheels);Cane - single point      Prior Function Prior Level of Function : Independent/Modified Independent             Mobility Comments: Pt reports he is I with mobility and denies falls in the last 6 months. He states he doesn't go out of the house much and it is a lot for him to go to the mailbox ADLs Comments: I with dressing and bathing. gets help with IADLs such as housework, food prep     Extremity/Trunk Assessment   Upper Extremity Assessment Upper Extremity Assessment: Overall WFL for tasks assessed    Lower Extremity Assessment Lower Extremity Assessment: Overall WFL for tasks assessed;Generalized weakness    Cervical / Trunk Assessment Cervical / Trunk Assessment: Normal  Communication   Communication Communication: No apparent difficulties    Cognition Arousal: Alert Behavior During Therapy: WFL for tasks assessed/performed   PT - Cognitive impairments: No apparent impairments                         Following commands: Intact       Cueing Cueing Techniques: Verbal cues, Gestural cues     General Comments General comments (skin integrity, edema, etc.): SpO2 remained in high 90s throughout session    Exercises Other Exercises Other Exercises: pt edu in role of PT in acute care setting, discharge reccomendations, safe mobility reccomendations including using RW when walking to the mailbox   Assessment/Plan    PT Assessment Patient needs continued PT services  PT Problem List Decreased strength;Decreased mobility;Decreased activity tolerance;Decreased balance;Decreased knowledge of use of DME;Cardiopulmonary status limiting activity        PT Treatment Interventions DME instruction;Therapeutic exercise;Gait training;Balance training;Stair training;Neuromuscular re-education;Functional mobility training;Therapeutic activities;Patient/family education    PT Goals (Current goals can be found in the Care Plan section)  Acute Rehab PT Goals Patient Stated Goal: go home PT Goal Formulation: With patient Time For Goal Achievement: 07/27/24 Potential to Achieve Goals: Good    Frequency Min 1X/week     Co-evaluation               AM-PAC PT 6 Clicks Mobility  Outcome Measure Help needed turning from your back to your side while in a flat bed without using bedrails?: None Help needed moving from lying on your back to sitting on the side of a flat bed without using bedrails?: None Help needed moving to and from a bed to a chair (including a wheelchair)?: A Little Help needed standing up from a chair using your arms (e.g., wheelchair or bedside chair)?: A Little Help needed to walk in hospital room?: A Little Help needed climbing 3-5 steps with a railing? : A Little 6 Click Score: 20    End of Session Equipment Utilized During Treatment: Gait belt Activity Tolerance: Patient tolerated treatment well Patient  left: in chair;with call bell/phone within reach;with chair alarm set Nurse Communication: Mobility status PT Visit Diagnosis: Unsteadiness on feet (R26.81);Other abnormalities of gait and mobility (R26.89);Muscle weakness (generalized) (M62.81)    Time: 8881-8856 PT Time Calculation (min) (ACUTE ONLY): 25 min   Charges:   PT Evaluation $PT Eval Low Complexity: 1 Low PT Treatments $Therapeutic Activity: 8-22 mins PT General Charges $$ ACUTE PT VISIT: 1 Visit         Camie SAUNDERS. Juli, PT, DPT, Cert. MDT, PRA-C 07/13/2024, 12:05 PM

## 2024-07-13 NOTE — Progress Notes (Signed)
"  ° °      CROSS COVER NOTE  NAME: Duane Owens MRN: [id number] DOB : [birth year]    Concern as stated by nurse / staff   Pt. admitted for pneumonia on 07/11/24. Per CCMD, patient expericenced 20 beat V-tach. Previous 14 beat V-tach detected on 07/12/24 during day shift. Patient has a pacemaker and is currently paced rhythm. No complaints of pain.      Pertinent findings on chart review: Progress notes as well as past cardiology notes reviewed Ischemic cardiomyopathy (LVEF 25% on 2021 CMR, 55% on echo 2023) CAD s/p 3V CABG 12/12/17 - S/P ICD 02/2020  Patient Assessment   Assessment and  Interventions   Assessment:  NSVT, asymptomatic Cardiomyopathy s/p AICD 02/2020 History of amiodarone  toxicity-per cardiology note  Plan: Will check and optimize potassium and magnesium  to maintain potassium over 4 and magnesium  over 2 Continue Toprol -XL X       "

## 2024-07-13 NOTE — TOC Transition Note (Signed)
 Transition of Care [facility]) - Discharge Note   Patient Details  Name: Duane Owens MRN: [id number] Date of Birth: 04/08/1941  [facility]) CM/SW Contact:  Brendaliz Kuk L Ziyad Dyar, LCSW Phone Number: 07/13/2024, 3:02 PM   Clinical Narrative:     Patient is medically ready for discharge. Orders for home health entered by provider. CSW spoke with daughter regarding choice. She was agreeable to [facility] servicing patient since they last provided Home Health services to him in 2023.   CSW placed request in [facility] portal. [facility] accepted the referral.   No DME needed. TOC signing off.         Patient Goals and CMS Choice            Discharge Placement                       Discharge Plan and Services Additional resources added to the After Visit Summary for                                       Social Drivers of Health (SDOH) Interventions SDOH Screenings   Food Insecurity: Patient Declined (07/11/2024)  Housing: Patient Declined (07/11/2024)  Transportation Needs: Patient Declined (07/11/2024)  Utilities: Patient Declined (07/11/2024)  Depression (PHQ2-9): Medium Risk (03/13/2023)  Social Connections: Unknown (07/11/2024)  Tobacco Use: Medium Risk (07/11/2024)     Readmission Risk Interventions     No data to display

## 2024-07-13 NOTE — Discharge Instructions (Signed)
 Home Health services have been arranged with [employer]. You should receive a call 24 to 48 hours after discharge.

## 2024-07-13 NOTE — Discharge Summary (Signed)
 "  Physician Discharge Summary   Duane Owens  male DOB: 01-19-1941  FMW:[id number]  PCP: Albina GORMAN Dine, MD  Admit date: 07/11/2024 Discharge date: 07/13/2024  Admitted From: home Disposition:  home Home Health: Yes CODE STATUS: Full code   Hospital Course:  For full details, please see H&P, progress notes, consult notes and ancillary notes.  Briefly,  Duane Owens is a pleasant 83 y.o. male with medical history significant for chronic gout left foot on allopurinol , prostate cancer, A-fib on Eliquis , HTN, who came into ED complaining of possible dehydration felt like not eating or drinking and gout of the feet.    Patient stated that 1 month ago he had a gout and his primary care physician gave him some medications for gout.  At the same time patient stated that he was also complaining of some cough with productive sputum from the same duration.  Primary care provider did not give him any medications.  Patient stated that since then he started having progressive shortness of breath along with worsening cough, body aches and pain.    1.  Possible Community-acquired pneumonia  --CXR showed New patchy opacity at the left lung base.  WBC 16, but procal only 0.17.  Pt was started on ceftriaxone , azithromycin  on admission, received 3 days, and discharged on 2 more days of Augmentin  because pt reported feeling better and wanted to go home.  Pt also requested Rx for 5 days of prednisone .   2.  recent gout flare --cont home allopurinol  and colchicine    3.  HTN --cont home Toprol , aldactone  and valsartan   HLD --cont statin   5.  Chronic pain syndrome on chronic opioids --cont home Norco PRN   6.  Atrial fibrillation with controlled response --cont Toprol  and Eliquis    7.  Hypokalemia --monitored and supplemented PRN   AKI --Cr 1.39 on presentation, improved with MIVF, was 0.74 prior to discharge.   Discharge Diagnoses:  Principal Problem:   CAP (community  acquired pneumonia) Active Problems:   Chronic low back pain (Location of Primary Source of Pain) (Bilateral) (R>L)   Chronic pain syndrome   Gout   History of prostate cancer   Mixed hyperlipidemia   Essential hypertension, benign   Anemia of chronic disease     Discharge Instructions:  Allergies as of 07/13/2024       Reactions   Amiodarone  Other (See Comments)   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  Would consider alternative agents if possible.        Medication List     TAKE these medications    allopurinol  300 MG tablet Commonly known as: ZYLOPRIM  Take 1.5 tablets (450 mg total) by mouth daily.   apixaban  5 MG Tabs tablet Commonly known as: ELIQUIS  Take 5 mg by mouth 2 (two) times daily.   atorvastatin  20 MG tablet Commonly known as: LIPITOR TAKE 1 TABLET BY MOUTH AT BEDTIME   colchicine  0.6 MG tablet TAKE 1 TABLET BY MOUTH ONCE DAILY. UNTILPAIN FREE AS DIRECTED   furosemide  20 MG tablet Commonly known as: Lasix  Take 1 tablet (20 mg total) by mouth as needed. Home med. What changed:  when to take this reasons to take this additional instructions   HYDROcodone -acetaminophen  10-325 MG tablet Commonly known as: NORCO Take 1 tablet by mouth 3 (three) times daily as needed.   metoprolol  succinate 100 MG 24 hr tablet Commonly known as: TOPROL -XL Take 100 mg by mouth 2 (two) times daily.  mirtazapine  15 MG tablet Commonly known as: REMERON  TAKE 1 TABLET BY MOUTH AT BEDTIME   pantoprazole  40 MG tablet Commonly known as: PROTONIX  TAKE 1 TABLET BY MOUTH ONCE DAILY   predniSONE  20 MG tablet Commonly known as: DELTASONE  Take 2 tablets (40 mg total) by mouth daily for 5 days.   spironolactone  25 MG tablet Commonly known as: Aldactone  Take 1 tablet (25 mg total) by mouth daily.   traZODone  50 MG tablet Commonly known as: DESYREL  Take 1 tablet (50 mg total) by mouth at bedtime as needed. for sleep    valsartan 40 MG tablet Commonly known as: DIOVAN Take 0.5 tablets by mouth 2 (two) times daily.       ASK your doctor about these medications    amoxicillin -clavulanate 875-125 MG tablet Commonly known as: AUGMENTIN  Take 1 tablet by mouth 2 (two) times daily for 2 days. Ask about: Should I take this medication?         Follow-up Information     Albina GORMAN Dine, MD Follow up in 1 week(s).   Specialty: Internal Medicine Contact information: [address] [place] [address] [phone]                 Allergies[1]   The results of significant diagnostics from this hospitalization (including imaging, microbiology, ancillary and laboratory) are listed below for reference.   Consultations:   Procedures/Studies: DG Chest 2 View Result Date: 07/11/2024 EXAM: 2 VIEW(S) XRAY OF THE CHEST 07/11/2024 02:33:00 PM COMPARISON: 11/03/2023 CLINICAL HISTORY: cough 1 week FINDINGS: LINES, TUBES AND DEVICES: Stable left subclavian dual lead transvenous pacemaker. LUNGS AND PLEURA: New patchy opacity at the left lung base. Associated elevated left hemidiaphragm. Biapical pleural / pulmonary scarring. No pleural effusion. No pneumothorax. HEART AND MEDIASTINUM: Aortic atherosclerosis. CABG markers noted. BONES AND SOFT TISSUES: Sternotomy wires noted. Bilateral acromioclavicular DJD. IMPRESSION: 1. New patchy opacity at the left lung base with associated elevated left hemidiaphragm. The finding may represent atelectasis versus developing pneumonia. Follow-up PA and lateral chest X-ray is recommended in 3-4 weeks following therapy to ensure resolution. Electronically signed by: Kate Plummer MD 07/11/2024 03:08 PM EST RP Workstation: [id number]      Labs: BNP (last 3 results) Recent Labs    11/03/23 1746  BNP 1,322.8*   Basic Metabolic Panel: Recent Labs  Lab 07/11/24 1141 07/11/24 1547 07/12/24 0633 07/13/24 0511  NA 134*  --  136 137  K 3.4*  --  3.5 3.5  CL  102  --  107 109  CO2 19*  --  19* 17*  GLUCOSE 116*  --  86 79  BUN 24*  --  20 16  CREATININE 1.39* 1.37* 0.97 0.74  CALCIUM  8.8*  --  8.2* 8.2*  MG  --   --   --  1.7   Liver Function Tests: Recent Labs  Lab 07/11/24 1141 07/12/24 0633  AST 27 25  ALT 13 9  ALKPHOS 124 119  BILITOT 0.6 0.4  PROT 7.4 6.2*  ALBUMIN 3.8 3.1*   Recent Labs  Lab 07/11/24 1141  LIPASE 39   No results for input(s): AMMONIA in the last 168 hours. CBC: Recent Labs  Lab 07/11/24 1141 07/11/24 1547 07/12/24 0633  WBC 16.0* 15.1* 13.8*  HGB 10.9* 11.2* 9.0*  HCT 33.0* 35.2* 28.0*  MCV 91.9 94.6 94.6  PLT 183 165 133*   Cardiac Enzymes: No results for input(s): CKTOTAL, CKMB, CKMBINDEX, TROPONINI in the last 168 hours. BNP: Invalid input(s): POCBNP  CBG: No results for input(s): GLUCAP in the last 168 hours. D-Dimer No results for input(s): DDIMER in the last 72 hours. Hgb A1c No results for input(s): HGBA1C in the last 72 hours. Lipid Profile No results for input(s): CHOL, HDL, LDLCALC, TRIG, CHOLHDL, LDLDIRECT in the last 72 hours. Thyroid  function studies No results for input(s): TSH, T4TOTAL, T3FREE, THYROIDAB in the last 72 hours.  Invalid input(s): FREET3 Anemia work up No results for input(s): VITAMINB12, FOLATE, FERRITIN, TIBC, IRON, RETICCTPCT in the last 72 hours. Urinalysis    Component Value Date/Time   COLORURINE YELLOW (A) 07/11/2024 2130   APPEARANCEUR CLEAR (A) 07/11/2024 2130   APPEARANCEUR Clear 06/28/2023 1423   LABSPEC 1.019 07/11/2024 2130   LABSPEC 1.020 05/31/2014 2217   PHURINE 5.0 07/11/2024 2130   GLUCOSEU NEGATIVE 07/11/2024 2130   GLUCOSEU see comment 05/31/2014 2217   HGBUR SMALL (A) 07/11/2024 2130   BILIRUBINUR NEGATIVE 07/11/2024 2130   BILIRUBINUR Negative 06/28/2023 1423   BILIRUBINUR see comment 05/31/2014 2217   KETONESUR NEGATIVE 07/11/2024 2130   PROTEINUR 30 (A) 07/11/2024 2130    NITRITE NEGATIVE 07/11/2024 2130   LEUKOCYTESUR NEGATIVE 07/11/2024 2130   LEUKOCYTESUR see comment 05/31/2014 2217   Sepsis Labs Recent Labs  Lab 07/11/24 1141 07/11/24 1547 07/12/24 0633  WBC 16.0* 15.1* 13.8*   Microbiology Recent Results (from the past 240 hours)  Blood Culture (routine x 2)     Status: None   Collection Time: 07/11/24  3:47 PM   Specimen: BLOOD  Result Value Ref Range Status   Specimen Description BLOOD RIGHT ANTECUBITAL  Final   Special Requests   Final    BOTTLES DRAWN AEROBIC AND ANAEROBIC Blood Culture results may not be optimal due to an inadequate volume of blood received in culture bottles   Culture   Final    NO GROWTH 5 DAYS Performed at [facility], [address], [place], [place] [address]    Report Status 07/16/2024 FINAL  Final  Blood Culture (routine x 2)     Status: None   Collection Time: 07/11/24  3:47 PM   Specimen: BLOOD  Result Value Ref Range Status   Specimen Description BLOOD LEFT ANTECUBITAL  Final   Special Requests   Final    BOTTLES DRAWN AEROBIC AND ANAEROBIC Blood Culture adequate volume   Culture   Final    NO GROWTH 5 DAYS Performed at [facility], [address] [place], [place] [address]    Report Status 07/16/2024 FINAL  Final  Resp panel by RT-PCR (RSV, Flu A&B, Covid) Anterior Nasal Swab     Status: None   Collection Time: 07/11/24  4:10 PM   Specimen: Anterior Nasal Swab  Result Value Ref Range Status   SARS Coronavirus 2 by RT PCR NEGATIVE NEGATIVE Final    Comment: (NOTE) SARS-CoV-2 target nucleic acids are NOT DETECTED.  The SARS-CoV-2 RNA is generally detectable in upper respiratory specimens during the acute phase of infection. The lowest concentration of SARS-CoV-2 viral copies this assay can detect is 138 copies/mL. A negative result does not preclude SARS-Cov-2 infection and should not be used as the sole basis for treatment or other patient management decisions. A negative  result may occur with  improper specimen collection/handling, submission of specimen other than nasopharyngeal swab, presence of viral mutation(s) within the areas targeted by this assay, and inadequate number of viral copies(<138 copies/mL). A negative result must be combined with clinical observations, patient history, and epidemiological information. The  expected result is Negative.  Fact Sheet for Patients:  bloggercourse.com  Fact Sheet for Healthcare Providers:  seriousbroker.it  This test is no t yet approved or cleared by the [place]  FDA and  has been authorized for detection and/or diagnosis of SARS-CoV-2 by FDA under an Emergency Use Authorization (EUA). This EUA will remain  in effect (meaning this test can be used) for the duration of the COVID-19 declaration under Section 564(b)(1) of the Act, 21 U.S.C.section 360bbb-3(b)(1), unless the authorization is terminated  or revoked sooner.       Influenza A by PCR NEGATIVE NEGATIVE Final   Influenza B by PCR NEGATIVE NEGATIVE Final    Comment: (NOTE) The Xpert Xpress SARS-CoV-2/FLU/RSV plus assay is intended as an aid in the diagnosis of influenza from Nasopharyngeal swab specimens and should not be used as a sole basis for treatment. Nasal washings and aspirates are unacceptable for Xpert Xpress SARS-CoV-2/FLU/RSV testing.  Fact Sheet for Patients: bloggercourse.com  Fact Sheet for Healthcare Providers: seriousbroker.it  This test is not yet approved or cleared by the [place]  FDA and has been authorized for detection and/or diagnosis of SARS-CoV-2 by FDA under an Emergency Use Authorization (EUA). This EUA will remain in effect (meaning this test can be used) for the duration of the COVID-19 declaration under Section 564(b)(1) of the Act, 21 U.S.C. section 360bbb-3(b)(1), unless the authorization is  terminated or revoked.     Resp Syncytial Virus by PCR NEGATIVE NEGATIVE Final    Comment: (NOTE) Fact Sheet for Patients: bloggercourse.com  Fact Sheet for Healthcare Providers: seriousbroker.it  This test is not yet approved or cleared by the [place]  FDA and has been authorized for detection and/or diagnosis of SARS-CoV-2 by FDA under an Emergency Use Authorization (EUA). This EUA will remain in effect (meaning this test can be used) for the duration of the COVID-19 declaration under Section 564(b)(1) of the Act, 21 U.S.C. section 360bbb-3(b)(1), unless the authorization is terminated or revoked.  Performed at [facility], [address] [place], [place] [address]   Respiratory (~20 pathogens) panel by PCR     Status: None   Collection Time: 07/13/24  4:05 AM   Specimen: Nasopharyngeal Swab; Respiratory  Result Value Ref Range Status   Adenovirus NOT DETECTED NOT DETECTED Final   Coronavirus 229E NOT DETECTED NOT DETECTED Final    Comment: (NOTE) The Coronavirus on the Respiratory Panel, DOES NOT test for the novel  Coronavirus (2019 nCoV)    Coronavirus HKU1 NOT DETECTED NOT DETECTED Final   Coronavirus NL63 NOT DETECTED NOT DETECTED Final   Coronavirus OC43 NOT DETECTED NOT DETECTED Final   Metapneumovirus NOT DETECTED NOT DETECTED Final   Rhinovirus / Enterovirus NOT DETECTED NOT DETECTED Final   Influenza A NOT DETECTED NOT DETECTED Final   Influenza B NOT DETECTED NOT DETECTED Final   Parainfluenza Virus 1 NOT DETECTED NOT DETECTED Final   Parainfluenza Virus 2 NOT DETECTED NOT DETECTED Final   Parainfluenza Virus 3 NOT DETECTED NOT DETECTED Final   Parainfluenza Virus 4 NOT DETECTED NOT DETECTED Final   Respiratory Syncytial Virus NOT DETECTED NOT DETECTED Final   Bordetella pertussis NOT DETECTED NOT DETECTED Final   Bordetella Parapertussis NOT DETECTED NOT DETECTED Final   Chlamydophila  pneumoniae NOT DETECTED NOT DETECTED Final   Mycoplasma pneumoniae NOT DETECTED NOT DETECTED Final    Comment: Performed at [facility], [address] [place], [place] [address]     Total time spend on  discharging this patient, including the last patient exam, discussing the hospital stay, instructions for ongoing care as it relates to all pertinent caregivers, as well as preparing the medical discharge records, prescriptions, and/or referrals as applicable, is 35 minutes.    Ellouise Haber, MD  [facility] 07/17/2024, 9:32 PM       [1]  Allergies Allergen Reactions   Amiodarone  Other (See Comments)    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  Would consider alternative agents if possible.   "

## 2024-07-16 LAB — CULTURE, BLOOD (ROUTINE X 2)
Culture: NO GROWTH
Culture: NO GROWTH
Special Requests: ADEQUATE

## 2024-07-31 ENCOUNTER — Other Ambulatory Visit: Payer: Self-pay | Admitting: Internal Medicine

## 2024-07-31 DIAGNOSIS — E782 Mixed hyperlipidemia: Secondary | ICD-10-CM

## 2024-08-02 ENCOUNTER — Telehealth: Payer: Self-pay | Admitting: Internal Medicine

## 2024-08-02 ENCOUNTER — Ambulatory Visit (INDEPENDENT_AMBULATORY_CARE_PROVIDER_SITE_OTHER): Admitting: Internal Medicine

## 2024-08-02 VITALS — BP 94/50 | HR 81 | Temp 96.0°F | Ht 72.0 in | Wt 137.0 lb

## 2024-08-02 DIAGNOSIS — Z0130 Encounter for examination of blood pressure without abnormal findings: Secondary | ICD-10-CM

## 2024-08-02 DIAGNOSIS — J8410 Pulmonary fibrosis, unspecified: Secondary | ICD-10-CM | POA: Diagnosis not present

## 2024-08-02 DIAGNOSIS — E861 Hypovolemia: Secondary | ICD-10-CM

## 2024-08-02 NOTE — Progress Notes (Unsigned)
 "  Established Patient Office Visit  Subjective:  Patient ID: Duane Owens, male    DOB: [birth year]  Age: 84 y.o. MRN: [id number]  Chief Complaint  Patient presents with   Hospitalization Follow-up    Hospital Follow Up    Hospital f/u for CAP. Had colchicine  induced diarrhea prior to hospitalization and still c/o postural dizziness. BP low today.    No other concerns at this time.   Past Medical History:  Diagnosis Date   Anemia    Arrhythmia    atrial fibrillation   Atherosclerosis    Cancer [facility])    prostate   CHF (congestive heart failure) (HCC)    Coronary artery disease    Hypercholesteremia    Hyperlipemia    Hypertension    Septicemia (HCC)    Spinal stenosis    Varicose vein    Venous insufficiency     Past Surgical History:  Procedure Laterality Date   BACK SURGERY     CYSTOSCOPY WITH DIRECT VISION INTERNAL URETHROTOMY N/A 02/02/2016   Procedure: CYSTOSCOPY WITH DIRECT VISION INTERNAL URETHROTOMY;  Surgeon: Ozell JONELLE Burkes, MD;  Location: [facility] ORS;  Service: Urology;  Laterality: N/A;   HOLMIUM LASER APPLICATION N/A 02/02/2016   Procedure: HOLMIUM LASER APPLICATION;  Surgeon: Ozell JONELLE Burkes, MD;  Location: [facility] ORS;  Service: Urology;  Laterality: N/A;   IR PERC TUN PERIT CATH WO PORT Lilliahna Schubring&I /IMAG  09/22/2021   IR REMOVAL TUN CV CATH W/O FL  10/15/2021   PERIPHERAL VASCULAR CATHETERIZATION N/A 08/11/2015   Procedure: Abdominal Aortogram w/Lower Extremity;  Surgeon: Cordella KANDICE Shawl, MD;  Location: [facility] INVASIVE CV LAB;  Service: Cardiovascular;  Laterality: N/A;   PERIPHERAL VASCULAR CATHETERIZATION  08/11/2015   Procedure: Lower Extremity Intervention;  Surgeon: Cordella KANDICE Shawl, MD;  Location: [facility] INVASIVE CV LAB;  Service: Cardiovascular;;   TEE WITHOUT CARDIOVERSION N/A 09/20/2021   Procedure: TRANSESOPHAGEAL ECHOCARDIOGRAM (TEE);  Surgeon: Darliss Rogue, MD;  Location: [facility] ORS;  Service: Cardiovascular;  Laterality: N/A;    TONSILLECTOMY      Social History   Socioeconomic History   Marital status: Widowed    Spouse name: Not on file   Number of children: Not on file   Years of education: Not on file   Highest education level: Not on file  Occupational History   Not on file  Tobacco Use   Smoking status: Former    Current packs/day: 0.50    Average packs/day: 0.5 packs/day for 5.0 years (2.5 ttl pk-yrs)    Types: Cigarettes   Smokeless tobacco: Never   Tobacco comments:    Has called [facility] and ordered nicotene patches. Will Quit when patches start  Vaping Use   Vaping status: Never Used  Substance and Sexual Activity   Alcohol use: No   Drug use: No   Sexual activity: Not on file  Other Topics Concern   Not on file  Social History Narrative   Not on file   Social Drivers of Health   Tobacco Use: Medium Risk (07/11/2024)   Patient History    Smoking Tobacco Use: Former    Smokeless Tobacco Use: Never    Passive Exposure: Not on file  Financial Resource Strain: Not on file  Food Insecurity: Patient Declined (07/11/2024)   Epic    Worried About Radiation Protection Practitioner of Food in the Last Year: Patient declined    Barista in the Last Year: Patient declined  Transportation Needs: Patient Declined (07/11/2024)  Epic    Lack of Transportation (Medical): Patient declined    Lack of Transportation (Non-Medical): Patient declined  Physical Activity: Not on file  Stress: Not on file  Social Connections: Unknown (07/11/2024)   Social Connection and Isolation Panel    Frequency of Communication with Friends and Family: Patient declined    Frequency of Social Gatherings with Friends and Family: Patient unable to answer    Attends Religious Services: Patient declined    Active Member of Clubs or Organizations: Patient declined    Attends Banker Meetings: Patient declined    Marital Status: Patient declined  Intimate Partner Violence: Patient Declined  (07/11/2024)   Epic    Fear of Current or Ex-Partner: Patient declined    Emotionally Abused: Patient declined    Physically Abused: Patient declined    Sexually Abused: Patient declined  Depression (PHQ2-9): Medium Risk (03/13/2023)   Depression (PHQ2-9)    PHQ-2 Score: 9  Alcohol Screen: Not on file  Housing: Patient Declined (07/11/2024)   Epic    Unable to Pay for Housing in the Last Year: Patient declined    Number of Times Moved in the Last Year: Not on file    Homeless in the Last Year: Patient declined  Utilities: Patient Declined (07/11/2024)   Epic    Threatened with loss of utilities: Patient declined  Health Literacy: Not on file    Family History  Problem Relation Age of Onset   Heart failure Mother    Diabetes Brother    COPD Brother     Allergies[1]  Show/hide medication list[2]  Review of Systems  Constitutional:  Positive for weight loss (20 lbs).       Poor appetite due to recent dental extractions  HENT: Negative.    Eyes: Negative.   Respiratory:  Positive for shortness of breath. Negative for cough.   Cardiovascular: Negative.   Gastrointestinal: Negative.   Genitourinary: Negative.   Skin: Negative.   Neurological:  Negative for dizziness.  Endo/Heme/Allergies: Negative.        Objective:   BP (!) 94/50   Pulse 81   Temp (!) 96 F (35.6 C) (Tympanic)   Ht 6' (1.829 m)   Wt 137 lb (62.1 kg)   SpO2 97%   BMI 18.58 kg/m   Vitals:   08/02/24 1123  BP: (!) 94/50  Pulse: 81  Temp: (!) 96 F (35.6 C)  Height: 6' (1.829 m)  Weight: 137 lb (62.1 kg)  SpO2: 97%  TempSrc: Tympanic  BMI (Calculated): 18.58    Physical Exam Vitals reviewed.  Constitutional:      Appearance: Normal appearance. He is not ill-appearing.  HENT:     Head: Normocephalic.     Left Ear: There is no impacted cerumen.     Nose: Nose normal.     Mouth/Throat:     Mouth: Mucous membranes are moist.     Pharynx: No posterior oropharyngeal  erythema.  Eyes:     Extraocular Movements: Extraocular movements intact.     Pupils: Pupils are equal, round, and reactive to light.  Cardiovascular:     Rate and Rhythm: Regular rhythm.     Chest Wall: PMI is not displaced.     Pulses: Normal pulses.     Heart sounds: Normal heart sounds. No murmur heard. Pulmonary:     Effort: Pulmonary effort is normal.     Breath sounds: Normal air entry. Examination of the right-lower field reveals rales. Examination of the  left-lower field reveals rales. Rales present. No rhonchi.  Abdominal:     General: Abdomen is flat. Bowel sounds are normal. There is no distension.     Palpations: Abdomen is soft. There is no hepatomegaly, splenomegaly or mass.     Tenderness: There is no abdominal tenderness.  Musculoskeletal:        General: Normal range of motion.     Cervical back: Normal range of motion and neck supple.     Right lower leg: No edema.     Left lower leg: No edema.     Comments: Wearing lumbar brace  Skin:    General: Skin is warm and dry.  Neurological:     General: No focal deficit present.     Mental Status: He is alert and oriented to person, place, and time.     Cranial Nerves: No cranial nerve deficit.     Motor: No weakness.  Psychiatric:        Mood and Affect: Mood normal.        Behavior: Behavior normal.      No results found for any visits on 08/02/24.  Recent Results (from the past 2160 hours)  Comprehensive metabolic panel with GFR     Status: Abnormal   Collection Time: 06/14/24  9:55 AM  Result Value Ref Range   Glucose 121 (H) 70 - 99 mg/dL   BUN 14 8 - 27 mg/dL   Creatinine, Ser 9.08 0.76 - 1.27 mg/dL   eGFR 83 >40 fO/fpw/8.26   BUN/Creatinine Ratio 15 10 - 24   Sodium 141 134 - 144 mmol/L   Potassium 3.7 3.5 - 5.2 mmol/L   Chloride 103 96 - 106 mmol/L   CO2 24 20 - 29 mmol/L   Calcium  8.7 8.6 - 10.2 mg/dL   Total Protein 6.5 6.0 - 8.5 g/dL   Albumin 3.7 3.7 - 4.7 g/dL   Globulin, Total 2.8 1.5 -  4.5 g/dL   Bilirubin Total 0.6 0.0 - 1.2 mg/dL   Alkaline Phosphatase 132 (H) 48 - 129 IU/L   AST 14 0 - 40 IU/L   ALT 5 0 - 44 IU/L  Lipid panel     Status: Abnormal   Collection Time: 06/14/24  9:55 AM  Result Value Ref Range   Cholesterol, Total 93 (L) 100 - 199 mg/dL   Triglycerides 81 0 - 149 mg/dL   HDL 30 (L) >60 mg/dL   VLDL Cholesterol Cal 17 5 - 40 mg/dL   LDL Chol Calc (NIH) 46 0 - 99 mg/dL   Chol/HDL Ratio 3.1 0.0 - 5.0 ratio    Comment:                                   T. Chol/HDL Ratio                                             Men  Women                               1/2 Avg.Risk  3.4    3.3  Avg.Risk  5.0    4.4                                2X Avg.Risk  9.6    7.1                                3X Avg.Risk 23.4   11.0   Lipase, blood     Status: None   Collection Time: 07/11/24 11:41 AM  Result Value Ref Range   Lipase 39 11 - 51 U/L    Comment: Performed at [facility], [address] [place], [place] [address]  Comprehensive metabolic panel     Status: Abnormal   Collection Time: 07/11/24 11:41 AM  Result Value Ref Range   Sodium 134 (L) 135 - 145 mmol/L   Potassium 3.4 (L) 3.5 - 5.1 mmol/L   Chloride 102 98 - 111 mmol/L   CO2 19 (L) 22 - 32 mmol/L   Glucose, Bld 116 (H) 70 - 99 mg/dL    Comment: Glucose reference range applies only to samples taken after fasting for at least 8 hours.   BUN 24 (H) 8 - 23 mg/dL   Creatinine, Ser 8.60 (H) 0.61 - 1.24 mg/dL   Calcium  8.8 (L) 8.9 - 10.3 mg/dL   Total Protein 7.4 6.5 - 8.1 g/dL   Albumin 3.8 3.5 - 5.0 g/dL   AST 27 15 - 41 U/L   ALT 13 0 - 44 U/L   Alkaline Phosphatase 124 38 - 126 U/L   Total Bilirubin 0.6 0.0 - 1.2 mg/dL   GFR, Estimated 50 (L) >60 mL/min    Comment: (NOTE) Calculated using the CKD-EPI Creatinine Equation (2021)    Anion gap 13 5 - 15    Comment: Performed at [facility], [address] [place], [place] [address]  CBC      Status: Abnormal   Collection Time: 07/11/24 11:41 AM  Result Value Ref Range   WBC 16.0 (H) 4.0 - 10.5 K/uL   RBC 3.59 (L) 4.22 - 5.81 MIL/uL   Hemoglobin 10.9 (L) 13.0 - 17.0 g/dL   HCT 66.9 (L) 60.9 - 47.9 %   MCV 91.9 80.0 - 100.0 fL   MCH 30.4 26.0 - 34.0 pg   MCHC 33.0 30.0 - 36.0 g/dL   RDW 85.1 88.4 - 84.4 %   Platelets 183 150 - 400 K/uL   nRBC 0.0 0.0 - 0.2 %    Comment: Performed at [facility], [address] [place], [place] [address]  Troponin T, High Sensitivity     Status: Abnormal   Collection Time: 07/11/24 11:41 AM  Result Value Ref Range   Troponin T High Sensitivity 38 (H) 0 - 19 ng/L    Comment: (NOTE) Biotin concentrations > 1000 ng/mL falsely decrease TnT results.  Serial cardiac troponin measurements are suggested.  Refer to the Links section for chest pain algorithms and additional  guidance. Performed at [facility], [address] [place], [place] [address]   Lactic acid, plasma     Status: None   Collection Time: 07/11/24  3:47 PM  Result Value Ref Range   Lactic Acid, Venous 1.9 0.5 - 1.9 mmol/L    Comment: Performed at [facility], [address] [place], [place] [address]  Protime-INR     Status: Abnormal  Collection Time: 07/11/24  3:47 PM  Result Value Ref Range   Prothrombin Time 21.6 (H) 11.4 - 15.2 seconds   INR 1.8 (H) 0.8 - 1.2    Comment: (NOTE) INR goal varies based on device and disease states. Performed at [facility], [address] [place], [place] [address]   Blood Culture (routine x 2)     Status: None   Collection Time: 07/11/24  3:47 PM   Specimen: BLOOD  Result Value Ref Range   Specimen Description BLOOD RIGHT ANTECUBITAL    Special Requests      BOTTLES DRAWN AEROBIC AND ANAEROBIC Blood Culture results may not be optimal due to an inadequate volume of blood received in culture bottles   Culture      NO GROWTH 5 DAYS Performed at [facility], [address] [place] [address], [place], [place] [address]    Report Status 07/16/2024 FINAL   Blood Culture (routine x 2)     Status: None   Collection Time: 07/11/24  3:47 PM   Specimen: BLOOD  Result Value Ref Range   Specimen Description BLOOD LEFT ANTECUBITAL    Special Requests      BOTTLES DRAWN AEROBIC AND ANAEROBIC Blood Culture adequate volume   Culture      NO GROWTH 5 DAYS Performed at [facility], [address], [place], [place] [address]    Report Status 07/16/2024 FINAL   Procalcitonin     Status: None   Collection Time: 07/11/24  3:47 PM  Result Value Ref Range   Procalcitonin 0.17 ng/mL    Comment: (NOTE)   Sepsis PCT Algorithm          Lower Respiratory Tract Infection                                         PCT Algorithm -----------------------------------------------------------------  <0.5 ng/mL                    <0.10 ng/mL  Associated with low           Antibiotic therapy strongly   risk for progression          discouraged. Indicates absence   to severe sepsis              of bacteria infection  and/or septic shock             --------------------------------------------------------------  0.5-2.0 ng/mL                 0.10-0.25 ng/mL  Recommended to retest         Antibiotic therapy discouraged.  PCT within 6-24 hours         Bacterial infection unlikely  ------------------------------------------------------------  >2 ng/mL                      0.26-0.50 ng/mL  Associated with high risk     Antibiotic therapy encouraged.  for progression to severe     Bacterial infection possible  sepsis/and or septic shock    ------------------------------                                 >0.50 ng/mL  Antibiotic therapy strongly                                 encouraged.                                Suggestive of presence of                                 bacterial infection.                                  -------------------------------------------------------------------  < or = 0.50 ng/mL OR          < or = 0.25 OR 80% decrease in PCT  80% decrease in PCT           Antibiotic therapy   Antibiotic therapy may        may be discontinued  be discontinued                                 Performed at [facility] [address] [place], [place] [address]   CBC     Status: Abnormal   Collection Time: 07/11/24  3:47 PM  Result Value Ref Range   WBC 15.1 (H) 4.0 - 10.5 K/uL   RBC 3.72 (L) 4.22 - 5.81 MIL/uL   Hemoglobin 11.2 (L) 13.0 - 17.0 g/dL   HCT 64.7 (L) 60.9 - 47.9 %   MCV 94.6 80.0 - 100.0 fL   MCH 30.1 26.0 - 34.0 pg   MCHC 31.8 30.0 - 36.0 g/dL   RDW 85.1 88.4 - 84.4 %   Platelets 165 150 - 400 K/uL   nRBC 0.0 0.0 - 0.2 %    Comment: Performed at [facility], [address] [place], [place] [address]  Creatinine, serum     Status: Abnormal   Collection Time: 07/11/24  3:47 PM  Result Value Ref Range   Creatinine, Ser 1.37 (H) 0.61 - 1.24 mg/dL   GFR, Estimated 51 (L) >60 mL/min    Comment: (NOTE) Calculated using the CKD-EPI Creatinine Equation (2021) Performed at [facility], [address] [place], [place] [address]   Resp panel by RT-PCR (RSV, Flu A&B, Covid) Anterior Nasal Swab     Status: None   Collection Time: 07/11/24  4:10 PM   Specimen: Anterior Nasal Swab  Result Value Ref Range   SARS Coronavirus 2 by RT PCR NEGATIVE NEGATIVE    Comment: (NOTE) SARS-CoV-2 target nucleic acids are NOT DETECTED.  The SARS-CoV-2 RNA is generally detectable in upper respiratory specimens during the acute phase of infection. The lowest concentration of SARS-CoV-2 viral copies this assay can detect is 138 copies/mL. A negative result does not preclude SARS-Cov-2 infection and should not be used as the sole basis for treatment or other patient management decisions. A negative result may occur with  improper specimen collection/handling,  submission of specimen other than nasopharyngeal swab, presence of viral mutation(Galya Dunnigan) within the areas targeted by this assay, and inadequate number of viral copies(<138 copies/mL). A negative result must be combined with clinical observations, patient history, and epidemiological information. The expected result is Negative.  Fact Sheet for Patients:  bloggercourse.com  Fact Sheet for Healthcare Providers:  seriousbroker.it  This test is no t yet approved or cleared by the [place]  FDA and  has been authorized for detection and/or diagnosis of SARS-CoV-2 by FDA under an Emergency Use Authorization (EUA). This EUA will remain  in effect (meaning this test can be used) for the duration of the COVID-19 declaration under Section 564(b)(1) of the Act, 21 U.Manon Banbury.C.section 360bbb-3(b)(1), unless the authorization is terminated  or revoked sooner.       Influenza A by PCR NEGATIVE NEGATIVE   Influenza B by PCR NEGATIVE NEGATIVE    Comment: (NOTE) The Xpert Xpress SARS-CoV-2/FLU/RSV plus assay is intended as an aid in the diagnosis of influenza from Nasopharyngeal swab specimens and should not be used as a sole basis for treatment. Nasal washings and aspirates are unacceptable for Xpert Xpress SARS-CoV-2/FLU/RSV testing.  Fact Sheet for Patients: bloggercourse.com  Fact Sheet for Healthcare Providers: seriousbroker.it  This test is not yet approved or cleared by the [place]  FDA and has been authorized for detection and/or diagnosis of SARS-CoV-2 by FDA under an Emergency Use Authorization (EUA). This EUA will remain in effect (meaning this test can be used) for the duration of the COVID-19 declaration under Section 564(b)(1) of the Act, 21 U.Oluwatosin Bracy.C. section 360bbb-3(b)(1), unless the authorization is terminated or revoked.     Resp Syncytial Virus by PCR NEGATIVE NEGATIVE     Comment: (NOTE) Fact Sheet for Patients: bloggercourse.com  Fact Sheet for Healthcare Providers: seriousbroker.it  This test is not yet approved or cleared by the [place]  FDA and has been authorized for detection and/or diagnosis of SARS-CoV-2 by FDA under an Emergency Use Authorization (EUA). This EUA will remain in effect (meaning this test can be used) for the duration of the COVID-19 declaration under Section 564(b)(1) of the Act, 21 U.Amarra Sawyer.C. section 360bbb-3(b)(1), unless the authorization is terminated or revoked.  Performed at [facility], [address] [place], [place] [address]   Lactic acid, plasma     Status: Abnormal   Collection Time: 07/11/24  5:19 PM  Result Value Ref Range   Lactic Acid, Venous 3.4 (HH) 0.5 - 1.9 mmol/L    Comment: CRITICAL RESULT CALLED TO, READ BACK BY AND VERIFIED WITH: ELLOUISE BUDGE 07/11/24 1838 MU Performed at [facility], [address] [place], [place] [address]   Urinalysis, w/ Reflex to Culture (Infection Suspected) -Urine, Clean Catch     Status: Abnormal   Collection Time: 07/11/24  9:30 PM  Result Value Ref Range   Specimen Source URINE, CLEAN CATCH    Color, Urine YELLOW (A) YELLOW   APPearance CLEAR (A) CLEAR   Specific Gravity, Urine 1.019 1.005 - 1.030   pH 5.0 5.0 - 8.0   Glucose, UA NEGATIVE NEGATIVE mg/dL   Hgb urine dipstick SMALL (A) NEGATIVE   Bilirubin Urine NEGATIVE NEGATIVE   Ketones, ur NEGATIVE NEGATIVE mg/dL   Protein, ur 30 (A) NEGATIVE mg/dL   Nitrite NEGATIVE NEGATIVE   Leukocytes,Ua NEGATIVE NEGATIVE   RBC / HPF 0-5 0 - 5 RBC/hpf   WBC, UA 0-5 0 - 5 WBC/hpf    Comment:        Reflex urine culture not performed if WBC <=10, OR if Squamous epithelial cells >5. If Squamous epithelial cells >5 suggest recollection.    Bacteria, UA NONE SEEN NONE SEEN   Squamous Epithelial / HPF 0 0 - 5 /HPF   Hyaline Casts, UA  PRESENT      Comment: Performed at [facility], [address] [place], [place] [address]  Comprehensive metabolic panel     Status: Abnormal   Collection Time: 07/12/24  6:33 AM  Result Value Ref Range   Sodium 136 135 - 145 mmol/L   Potassium 3.5 3.5 - 5.1 mmol/L   Chloride 107 98 - 111 mmol/L   CO2 19 (L) 22 - 32 mmol/L   Glucose, Bld 86 70 - 99 mg/dL    Comment: Glucose reference range applies only to samples taken after fasting for at least 8 hours.   BUN 20 8 - 23 mg/dL   Creatinine, Ser 9.02 0.61 - 1.24 mg/dL   Calcium  8.2 (L) 8.9 - 10.3 mg/dL   Total Protein 6.2 (L) 6.5 - 8.1 g/dL   Albumin 3.1 (L) 3.5 - 5.0 g/dL   AST 25 15 - 41 U/L   ALT 9 0 - 44 U/L   Alkaline Phosphatase 119 38 - 126 U/L   Total Bilirubin 0.4 0.0 - 1.2 mg/dL   GFR, Estimated >39 >39 mL/min    Comment: (NOTE) Calculated using the CKD-EPI Creatinine Equation (2021)    Anion gap 10 5 - 15    Comment: Performed at [facility], [address] [place], [place] [address]  CBC     Status: Abnormal   Collection Time: 07/12/24  6:33 AM  Result Value Ref Range   WBC 13.8 (H) 4.0 - 10.5 K/uL   RBC 2.96 (L) 4.22 - 5.81 MIL/uL   Hemoglobin 9.0 (L) 13.0 - 17.0 g/dL   HCT 71.9 (L) 60.9 - 47.9 %   MCV 94.6 80.0 - 100.0 fL   MCH 30.4 26.0 - 34.0 pg   MCHC 32.1 30.0 - 36.0 g/dL   RDW 85.0 88.4 - 84.4 %   Platelets 133 (L) 150 - 400 K/uL   nRBC 0.0 0.0 - 0.2 %    Comment: Performed at [facility], [address] [place], [place] [address]  Protime-INR     Status: Abnormal   Collection Time: 07/12/24  6:33 AM  Result Value Ref Range   Prothrombin Time 21.9 (H) 11.4 - 15.2 seconds   INR 1.8 (H) 0.8 - 1.2    Comment: (NOTE) INR goal varies based on device and disease states. Performed at [facility], [address] [place], [place] [address]   Respiratory (~20 pathogens) panel by PCR     Status: None   Collection Time: 07/13/24  4:05 AM   Specimen: Nasopharyngeal Swab;  Respiratory  Result Value Ref Range   Adenovirus NOT DETECTED NOT DETECTED   Coronavirus 229E NOT DETECTED NOT DETECTED    Comment: (NOTE) The Coronavirus on the Respiratory Panel, DOES NOT test for the novel  Coronavirus (2019 nCoV)    Coronavirus HKU1 NOT DETECTED NOT DETECTED   Coronavirus NL63 NOT DETECTED NOT DETECTED   Coronavirus OC43 NOT DETECTED NOT DETECTED   Metapneumovirus NOT DETECTED NOT DETECTED   Rhinovirus / Enterovirus NOT DETECTED NOT DETECTED   Influenza A NOT DETECTED NOT DETECTED   Influenza B NOT DETECTED NOT DETECTED   Parainfluenza Virus 1 NOT DETECTED NOT DETECTED   Parainfluenza Virus 2 NOT DETECTED NOT DETECTED   Parainfluenza Virus 3 NOT DETECTED NOT DETECTED   Parainfluenza Virus 4 NOT DETECTED NOT DETECTED   Respiratory Syncytial Virus NOT DETECTED NOT DETECTED   Bordetella pertussis NOT DETECTED NOT DETECTED   Bordetella Parapertussis NOT DETECTED NOT  DETECTED   Chlamydophila pneumoniae NOT DETECTED NOT DETECTED   Mycoplasma pneumoniae NOT DETECTED NOT DETECTED    Comment: Performed at [facility], [address] [place], [place] [address]  Lactic acid, plasma     Status: None   Collection Time: 07/13/24  5:11 AM  Result Value Ref Range   Lactic Acid, Venous 0.8 0.5 - 1.9 mmol/L    Comment: Performed at [facility], [address] [place], [place] [address]  Basic metabolic panel     Status: Abnormal   Collection Time: 07/13/24  5:11 AM  Result Value Ref Range   Sodium 137 135 - 145 mmol/L   Potassium 3.5 3.5 - 5.1 mmol/L   Chloride 109 98 - 111 mmol/L   CO2 17 (L) 22 - 32 mmol/L   Glucose, Bld 79 70 - 99 mg/dL    Comment: Glucose reference range applies only to samples taken after fasting for at least 8 hours.   BUN 16 8 - 23 mg/dL   Creatinine, Ser 9.25 0.61 - 1.24 mg/dL   Calcium  8.2 (L) 8.9 - 10.3 mg/dL   GFR, Estimated >39 >39 mL/min    Comment: (NOTE) Calculated using the CKD-EPI Creatinine Equation (2021)     Anion gap 11 5 - 15    Comment: Performed at [facility], [address] [place], [place] [address]  Magnesium      Status: None   Collection Time: 07/13/24  5:11 AM  Result Value Ref Range   Magnesium  1.7 1.7 - 2.4 mg/dL    Comment: Performed at [facility], [address] [place], [place] [address]      Assessment & Plan:  Quandre was seen today for hospitalization follow-up.  Pulmonary fibrosis (HCC)  Hypotension due to hypovolemia    Problem List Items Addressed This Visit   None   No follow-ups on file.   Total time spent: 20 minutes. This time includes review of previous notes and results and patient face to face interaction during today'Shannah Conteh visit.    Sherrill Cinderella [place], MD  08/02/2024   This document may have been prepared by [facility] Voice Recognition software and as such may include unintentional dictation errors.       [1] Allergies Allergen Reactions   Amiodarone  Other (See Comments)    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  Would consider alternative agents if possible.  [2] Outpatient Medications Prior to Visit  Medication Sig   allopurinol  (ZYLOPRIM ) 300 MG tablet Take 1.5 tablets (450 mg total) by mouth daily.   apixaban  (ELIQUIS ) 5 MG TABS tablet Take 5 mg by mouth 2 (two) times daily.   atorvastatin  (LIPITOR) 20 MG tablet TAKE 1 TABLET BY MOUTH AT BEDTIME   colchicine  0.6 MG tablet TAKE 1 TABLET BY MOUTH ONCE DAILY. UNTILPAIN FREE AS DIRECTED   furosemide  (LASIX ) 20 MG tablet Take 1 tablet (20 mg total) by mouth as needed. Home med.   HYDROcodone -acetaminophen  (NORCO) 10-325 MG tablet Take 1 tablet by mouth 3 (three) times daily as needed.   metoprolol  succinate (TOPROL -XL) 100 MG 24 hr tablet Take 100 mg by mouth 2 (two) times daily.   mirtazapine  (REMERON ) 15 MG tablet TAKE 1 TABLET BY MOUTH AT BEDTIME   pantoprazole  (PROTONIX ) 40 MG tablet TAKE 1 TABLET BY MOUTH ONCE DAILY    spironolactone  (ALDACTONE ) 25 MG tablet Take 1 tablet (25 mg total) by mouth daily. (Patient not taking: No sig reported)   traZODone  (DESYREL )  50 MG tablet Take 1 tablet (50 mg total) by mouth at bedtime as needed. for sleep   valsartan (DIOVAN) 40 MG tablet Take 0.5 tablets by mouth 2 (two) times daily.   No facility-administered medications prior to visit.  "

## 2024-08-02 NOTE — Telephone Encounter (Signed)
 HH arrived at pts house at 5pm on 08-01-24 & pt didn't open door or respond via phone. This was for physical therapy

## 2024-08-16 ENCOUNTER — Ambulatory Visit: Admitting: Internal Medicine

## 2024-08-20 ENCOUNTER — Other Ambulatory Visit

## 2024-08-21 LAB — BMP8+ANION GAP
Anion Gap: 15 mmol/L (ref 10.0–18.0)
BUN/Creatinine Ratio: 15 (ref 10–24)
BUN: 30 mg/dL — ABNORMAL HIGH (ref 8–27)
CO2: 21 mmol/L (ref 20–29)
Calcium: 9.3 mg/dL (ref 8.6–10.2)
Chloride: 106 mmol/L (ref 96–106)
Creatinine, Ser: 2.01 mg/dL — ABNORMAL HIGH (ref 0.76–1.27)
Glucose: 196 mg/dL — ABNORMAL HIGH (ref 70–99)
Potassium: 4.4 mmol/L (ref 3.5–5.2)
Sodium: 142 mmol/L (ref 134–144)
eGFR: 32 mL/min/{1.73_m2} — ABNORMAL LOW

## 2024-08-23 ENCOUNTER — Ambulatory Visit: Admitting: Internal Medicine

## 2024-08-23 ENCOUNTER — Ambulatory Visit: Payer: Self-pay | Admitting: Internal Medicine

## 2024-08-23 VITALS — BP 120/60 | HR 60 | Ht 72.0 in | Wt 150.0 lb

## 2024-08-23 DIAGNOSIS — Z0130 Encounter for examination of blood pressure without abnormal findings: Secondary | ICD-10-CM

## 2024-08-23 DIAGNOSIS — E860 Dehydration: Secondary | ICD-10-CM

## 2024-08-23 DIAGNOSIS — N179 Acute kidney failure, unspecified: Secondary | ICD-10-CM

## 2024-08-23 NOTE — Progress Notes (Signed)
 "  Established Patient Office Visit  Subjective:  Patient ID: Duane Owens, male    DOB: 02/15/1941  Age: 84 y.o. MRN: [id number]  Chief Complaint  Patient presents with   Follow-up    2 week lab results     No new complaints, here for lab review and medication refills. BMP notable for azotemia and elevated Cr from baseline. Only drinks 300 ml of fluid/day.    No other concerns at this time.   Past Medical History:  Diagnosis Date   Anemia    Arrhythmia    atrial fibrillation   Atherosclerosis    [facility])    prostate   CHF (congestive heart failure) (HCC)    Coronary artery disease    Hypercholesteremia    Hyperlipemia    Hypertension    Septicemia (HCC)    Spinal stenosis    Varicose vein    Venous insufficiency     Past Surgical History:  Procedure Laterality Date   BACK SURGERY     CYSTOSCOPY WITH DIRECT VISION INTERNAL URETHROTOMY N/A 02/02/2016   Procedure: CYSTOSCOPY WITH DIRECT VISION INTERNAL URETHROTOMY;  Surgeon: Ozell JONELLE Burkes, MD;  Location: [facility] OR[facility];  Service: Urology;  Laterality: N/A;   HOLMIUM LASER APPLICATION N/A 02/02/2016   Procedure: HOLMIUM LASER APPLICATION;  Surgeon: Ozell JONELLE Burkes, MD;  Location: [facility];  Service: Urology;  Laterality: N/A;   IR PERC TUN PERIT CATH WO PORT Tyshon Fanning&I /IMAG  09/22/2021   IR REMOVAL TUN CV CATH W/O FL  10/15/2021   PERIPHERAL VASCULAR CATHETERIZATION N/A 08/11/2015   Procedure: Abdominal Aortogram w/Lower Extremity;  Surgeon: Cordella KANDICE Shawl, MD;  Location: [facility];  Service: Cardiovascular;  Laterality: N/A;   PERIPHERAL VASCULAR CATHETERIZATION  08/11/2015   Procedure: Lower Extremity Intervention;  Surgeon: Cordella KANDICE Shawl, MD;  Location: [facility];  Service: Cardiovascular;;   TEE WITHOUT CARDIOVERSION N/A 09/20/2021   Procedure: TRANSESOPHAGEAL ECHOCARDIOGRAM (TEE);  Surgeon: Darliss Rogue, MD;  Location: [facility];  Service: Cardiovascular;  Laterality: N/A;   TONSILLECTOMY       Social History   Socioeconomic History   Marital status: Widowed    Spouse name: Not on file   Number of children: Not on file   Years of education: Not on file   Highest education level: Not on file  Occupational History   Not on file  Tobacco Use   Smoking status: Former    Current packs/day: 0.50    Average packs/day: 0.5 packs/day for 5.0 years (2.5 ttl pk-yrs)    Types: Cigarettes   Smokeless tobacco: Never   Tobacco comments:    Has called [employer] [employer] and ordered nicotene patches. Will Quit when patches start  Vaping Use   Vaping status: Never Used  Substance and Sexual Activity   Alcohol use: No   Drug use: No   Sexual activity: Not on file  Other Topics Concern   Not on file  Social History Narrative   Not on file   Social Drivers of Health   Tobacco Use: Medium Risk (07/11/2024)   Patient History    Smoking Tobacco Use: Former    Smokeless Tobacco Use: Never    Passive Exposure: Not on file  Financial Resource Strain: Not on file  Food Insecurity: Patient Declined (07/11/2024)   Epic    Worried About Programme Researcher, Broadcasting/film/video in the Last Year: Patient declined    Barista in the Last Year: Patient  declined  Transportation Needs: Patient Declined (07/11/2024)   Epic    Lack of Transportation (Medical): Patient declined    Lack of Transportation (Non-Medical): Patient declined  Physical Activity: Not on file  Stress: Not on file  Social Connections: Unknown (07/11/2024)   Social Connection and Isolation Panel    Frequency of Communication with Friends and Family: Patient declined    Frequency of Social Gatherings with Friends and Family: Patient unable to answer    Attends Religious Services: Patient declined    Active Member of Clubs or Organizations: Patient declined    Attends Banker Meetings: Patient declined    Marital Status: Patient declined  Intimate Partner Violence: Patient Declined (07/11/2024)   Epic    Fear of Current  or Ex-Partner: Patient declined    Emotionally Abused: Patient declined    Physically Abused: Patient declined    Sexually Abused: Patient declined  Depression (PHQ2-9): Medium Risk (03/13/2023)   Depression (PHQ2-9)    PHQ-2 Score: 9  Alcohol Screen: Not on file  Housing: Patient Declined (07/11/2024)   Epic    Unable to Pay for Housing in the Last Year: Patient declined    Number of Times Moved in the Last Year: Not on file    Homeless in the Last Year: Patient declined  Utilities: Patient Declined (07/11/2024)   Epic    Threatened with loss of utilities: Patient declined  Health Literacy: Not on file    Family History  Problem Relation Age of Onset   Heart failure Mother    Diabetes Brother    COPD Brother     Allergies[1]  Show/hide medication list[2]  Review of Systems  Constitutional:  Negative for weight loss (gained 13 lbs).       Poor appetite due to recent dental extractions  HENT: Negative.    Eyes: Negative.   Respiratory:  Positive for shortness of breath. Negative for cough.   Cardiovascular: Negative.   Gastrointestinal: Negative.  Negative for diarrhea.  Genitourinary: Negative.   Skin: Negative.   Neurological:  Negative for dizziness.  Endo/Heme/Allergies: Negative.        Objective:   BP 120/60   Pulse 60   Ht 6' (1.829 m)   Wt 150 lb (68 kg)   SpO2 98%   BMI 20.34 kg/m   Vitals:   08/23/24 1125  BP: 120/60  Pulse: 60  Height: 6' (1.829 m)  Weight: 150 lb (68 kg)  SpO2: 98%  BMI (Calculated): 20.34    Physical Exam Vitals reviewed.  Constitutional:      Appearance: Normal appearance. He is not ill-appearing.  HENT:     Head: Normocephalic.     Left Ear: There is no impacted cerumen.     Nose: Nose normal.     Mouth/Throat:     Mouth: Mucous membranes are moist.     Pharynx: No posterior oropharyngeal erythema.  Eyes:     Extraocular Movements: Extraocular movements intact.     Pupils: Pupils are equal, round, and  reactive to light.  Cardiovascular:     Rate and Rhythm: Regular rhythm.     Chest Wall: PMI is not displaced.     Pulses: Normal pulses.     Heart sounds: Normal heart sounds. No murmur heard. Pulmonary:     Effort: Pulmonary effort is normal.     Breath sounds: Normal air entry. Examination of the right-lower field reveals rales. Examination of the left-lower field reveals rales. Rales present. No rhonchi.  Abdominal:     General: Abdomen is flat. Bowel sounds are normal. There is no distension.     Palpations: Abdomen is soft. There is no hepatomegaly, splenomegaly or mass.     Tenderness: There is no abdominal tenderness.  Musculoskeletal:        General: Normal range of motion.     Cervical back: Normal range of motion and neck supple.     Right lower leg: No edema.     Left lower leg: No edema.     Comments: Wearing lumbar brace  Skin:    General: Skin is warm and dry.  Neurological:     General: No focal deficit present.     Mental Status: He is alert and oriented to person, place, and time.     Cranial Nerves: No cranial nerve deficit.     Motor: No weakness.  Psychiatric:        Mood and Affect: Mood normal.        Behavior: Behavior normal.      No results found for any visits on 08/23/24.      Assessment & Plan:  Duane Owens was seen today for follow-up.  Acute kidney injury  Dehydration   Administer fluids and hold diuretics for a week. Problem List Items Addressed This Visit       Genitourinary   Acute kidney injury - Primary   Other Visit Diagnoses       Dehydration           Return in about 1 week (around 08/30/2024) for fu with labs prior.   Total time spent: 30 minutes. This time includes review of previous notes and results and patient face to face interaction during today'Elson Ulbrich visit.    Sherrill Cinderella Perry, MD  08/23/2024   This document may have been prepared by [facility] Voice Recognition software and as such may include unintentional  dictation errors.      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  Would consider alternative agents if possible.  [2]  Outpatient Medications Prior to Visit  Medication Sig   allopurinol  (ZYLOPRIM ) 300 MG tablet Take 1.5 tablets (450 mg total) by mouth daily.   apixaban  (ELIQUIS ) 5 MG TABS tablet Take 5 mg by mouth 2 (two) times daily.   atorvastatin  (LIPITOR) 20 MG tablet TAKE 1 TABLET BY MOUTH AT BEDTIME   colchicine  0.6 MG tablet TAKE 1 TABLET BY MOUTH ONCE DAILY. UNTILPAIN FREE AS DIRECTED   furosemide  (LASIX ) 20 MG tablet Take 1 tablet (20 mg total) by mouth as needed. Home med.   HYDROcodone -acetaminophen  (NORCO) 10-325 MG tablet Take 1 tablet by mouth 3 (three) times daily as needed.   metoprolol  succinate (TOPROL -XL) 100 MG 24 hr tablet Take 100 mg by mouth 2 (two) times daily.   mirtazapine  (REMERON ) 15 MG tablet TAKE 1 TABLET BY MOUTH AT BEDTIME   pantoprazole  (PROTONIX ) 40 MG tablet TAKE 1 TABLET BY MOUTH ONCE DAILY   spironolactone  (ALDACTONE ) 25 MG tablet Take 1 tablet (25 mg total) by mouth daily.   traZODone  (DESYREL ) 50 MG tablet Take 1 tablet (50 mg total) by mouth at bedtime as needed. for sleep   valsartan (DIOVAN) 40 MG tablet Take 0.5 tablets by mouth 2 (two) times daily.   No facility-administered medications prior to visit.   "

## 2024-09-20 ENCOUNTER — Ambulatory Visit: Admitting: Internal Medicine
